# Patient Record
Sex: Male | Born: 1970 | Race: Black or African American | Hispanic: No | Marital: Married | Smoking: Former smoker
Health system: Southern US, Community
[De-identification: ages and names within clinical notes are randomized; demographics above are authoritative.]

## PROBLEM LIST (undated history)

## (undated) DIAGNOSIS — J939 Pneumothorax, unspecified: Secondary | ICD-10-CM

## (undated) DIAGNOSIS — I10 Essential (primary) hypertension: Secondary | ICD-10-CM

## (undated) DIAGNOSIS — J449 Chronic obstructive pulmonary disease, unspecified: Secondary | ICD-10-CM

## (undated) DIAGNOSIS — F3181 Bipolar II disorder: Secondary | ICD-10-CM

## (undated) DIAGNOSIS — F419 Anxiety disorder, unspecified: Secondary | ICD-10-CM

## (undated) DIAGNOSIS — F329 Major depressive disorder, single episode, unspecified: Secondary | ICD-10-CM

## (undated) DIAGNOSIS — F32A Depression, unspecified: Secondary | ICD-10-CM

## (undated) DIAGNOSIS — I639 Cerebral infarction, unspecified: Secondary | ICD-10-CM

## (undated) DIAGNOSIS — R569 Unspecified convulsions: Secondary | ICD-10-CM

## (undated) HISTORY — PX: ESOPHAGEAL ATRESIA REPAIR: SHX1525

## (undated) HISTORY — DX: Chronic obstructive pulmonary disease, unspecified: J44.9

## (undated) HISTORY — DX: Cerebral infarction, unspecified: I63.9

---

## 2001-07-23 ENCOUNTER — Emergency Department (HOSPITAL_COMMUNITY): Admission: EM | Admit: 2001-07-23 | Discharge: 2001-07-23 | Payer: Self-pay | Admitting: *Deleted

## 2001-08-23 ENCOUNTER — Encounter: Payer: Self-pay | Admitting: Emergency Medicine

## 2001-08-23 ENCOUNTER — Emergency Department (HOSPITAL_COMMUNITY): Admission: EM | Admit: 2001-08-23 | Discharge: 2001-08-23 | Payer: Self-pay | Admitting: Emergency Medicine

## 2001-09-01 ENCOUNTER — Ambulatory Visit (HOSPITAL_COMMUNITY): Admission: RE | Admit: 2001-09-01 | Discharge: 2001-09-01 | Payer: Self-pay | Admitting: Internal Medicine

## 2001-09-01 ENCOUNTER — Encounter: Payer: Self-pay | Admitting: Internal Medicine

## 2001-11-26 ENCOUNTER — Encounter: Admission: RE | Admit: 2001-11-26 | Discharge: 2001-12-24 | Payer: Self-pay | Admitting: Orthopaedic Surgery

## 2001-12-13 ENCOUNTER — Encounter: Payer: Self-pay | Admitting: Emergency Medicine

## 2001-12-13 ENCOUNTER — Emergency Department (HOSPITAL_COMMUNITY): Admission: EM | Admit: 2001-12-13 | Discharge: 2001-12-13 | Payer: Self-pay | Admitting: Emergency Medicine

## 2002-06-27 ENCOUNTER — Emergency Department (HOSPITAL_COMMUNITY): Admission: EM | Admit: 2002-06-27 | Discharge: 2002-06-27 | Payer: Self-pay | Admitting: Emergency Medicine

## 2004-09-17 ENCOUNTER — Emergency Department (HOSPITAL_COMMUNITY): Admission: EM | Admit: 2004-09-17 | Discharge: 2004-09-17 | Payer: Self-pay | Admitting: Emergency Medicine

## 2004-12-17 ENCOUNTER — Emergency Department (HOSPITAL_COMMUNITY): Admission: EM | Admit: 2004-12-17 | Discharge: 2004-12-17 | Payer: Self-pay | Admitting: Emergency Medicine

## 2005-06-19 ENCOUNTER — Ambulatory Visit: Payer: Self-pay | Admitting: Internal Medicine

## 2005-07-25 ENCOUNTER — Ambulatory Visit: Payer: Self-pay | Admitting: Internal Medicine

## 2005-08-02 ENCOUNTER — Ambulatory Visit: Payer: Self-pay | Admitting: Internal Medicine

## 2005-08-14 ENCOUNTER — Ambulatory Visit: Payer: Self-pay | Admitting: Internal Medicine

## 2005-08-16 ENCOUNTER — Ambulatory Visit: Payer: Self-pay | Admitting: Internal Medicine

## 2006-09-01 ENCOUNTER — Emergency Department (HOSPITAL_COMMUNITY): Admission: EM | Admit: 2006-09-01 | Discharge: 2006-09-01 | Payer: Self-pay | Admitting: Family Medicine

## 2008-07-15 ENCOUNTER — Ambulatory Visit: Payer: Self-pay | Admitting: Internal Medicine

## 2008-07-15 LAB — CONVERTED CEMR LAB
BUN: 16 mg/dL (ref 6–23)
CO2: 26 meq/L (ref 19–32)
Calcium: 9.1 mg/dL (ref 8.4–10.5)
Chloride: 108 meq/L (ref 96–112)
Cholesterol: 209 mg/dL — ABNORMAL HIGH (ref 0–200)
Creatinine, Ser: 1.15 mg/dL (ref 0.40–1.50)
Glucose, Bld: 105 mg/dL — ABNORMAL HIGH (ref 70–99)
HDL: 89 mg/dL (ref >39)
LDL Cholesterol: 99 mg/dL (ref 0–99)
Microalb, Ur: 20.73 mg/dL — ABNORMAL HIGH (ref 0.00–1.89)
Potassium: 3.9 meq/L (ref 3.5–5.3)
Sodium: 145 meq/L (ref 135–145)
Total CHOL/HDL Ratio: 2.3
Triglycerides: 106 mg/dL (ref <150)
VLDL: 21 mg/dL (ref 0–40)

## 2008-09-14 ENCOUNTER — Ambulatory Visit: Payer: Self-pay | Admitting: Internal Medicine

## 2008-10-08 ENCOUNTER — Ambulatory Visit: Payer: Self-pay | Admitting: Internal Medicine

## 2009-02-16 ENCOUNTER — Other Ambulatory Visit: Payer: Self-pay | Admitting: Emergency Medicine

## 2009-02-16 ENCOUNTER — Inpatient Hospital Stay (HOSPITAL_COMMUNITY): Admission: AD | Admit: 2009-02-16 | Discharge: 2009-02-19 | Payer: Self-pay | Admitting: Psychiatry

## 2009-02-16 ENCOUNTER — Ambulatory Visit: Payer: Self-pay | Admitting: Psychiatry

## 2009-12-06 ENCOUNTER — Inpatient Hospital Stay: Payer: Self-pay | Admitting: Psychiatry

## 2010-06-07 ENCOUNTER — Emergency Department (HOSPITAL_COMMUNITY)
Admission: EM | Admit: 2010-06-07 | Discharge: 2010-06-07 | Disposition: A | Discharge status: Home / Self Care | Payer: Medicare Other | Attending: Emergency Medicine | Admitting: Emergency Medicine

## 2010-06-07 DIAGNOSIS — I10 Essential (primary) hypertension: Secondary | ICD-10-CM | POA: Insufficient documentation

## 2010-06-07 DIAGNOSIS — F1010 Alcohol abuse, uncomplicated: Secondary | ICD-10-CM | POA: Insufficient documentation

## 2010-06-07 LAB — CBC
HCT: 45.2 % (ref 39.0–52.0)
Hemoglobin: 15 g/dL (ref 13.0–17.0)
MCHC: 33.2 g/dL (ref 30.0–36.0)
MCV: 91.4 fL (ref 78.0–100.0)
RBC: 4.95 MIL/uL (ref 4.22–5.81)
RDW: 14 % (ref 11.5–15.5)

## 2010-06-07 LAB — BASIC METABOLIC PANEL
CO2: 29 mEq/L (ref 19–32)
Calcium: 9.2 mg/dL (ref 8.4–10.5)
Chloride: 100 mEq/L (ref 96–112)
GFR calc Af Amer: >60 mL/min (ref >60)
Glucose, Bld: 116 mg/dL — ABNORMAL HIGH (ref 70–99)
Potassium: 4 mEq/L (ref 3.5–5.1)
Sodium: 137 mEq/L (ref 135–145)

## 2010-06-07 LAB — ETHANOL: Alcohol, Ethyl (B): <5 mg/dL (ref 0–10)

## 2010-06-07 LAB — HEPATIC FUNCTION PANEL
ALT: 33 U/L (ref 0–53)
AST: 22 U/L (ref 0–37)
AST: 27 U/L (ref 0–37)
Albumin: 3.5 g/dL (ref 3.5–5.2)
Alkaline Phosphatase: 79 U/L (ref 39–117)
Bilirubin, Direct: <0.1 mg/dL (ref 0.0–0.3)
Total Bilirubin: 0.3 mg/dL (ref 0.3–1.2)
Total Protein: 6 g/dL (ref 6.0–8.3)
Total Protein: 6.1 g/dL (ref 6.0–8.3)

## 2010-06-07 LAB — DIFFERENTIAL
Basophils Absolute: 0 10*3/uL (ref 0.0–0.1)
Basophils Relative: 1 % (ref 0–1)
Eosinophils Absolute: 0.2 10*3/uL (ref 0.0–0.7)
Lymphs Abs: 1.8 10*3/uL (ref 0.7–4.0)
Monocytes Absolute: 0.5 10*3/uL (ref 0.1–1.0)
Neutro Abs: 2.3 10*3/uL (ref 1.7–7.7)

## 2010-06-07 LAB — RAPID URINE DRUG SCREEN, HOSP PERFORMED
Amphetamines: NOT DETECTED
Opiates: NOT DETECTED
Tetrahydrocannabinol: NOT DETECTED

## 2010-06-08 ENCOUNTER — Emergency Department (HOSPITAL_COMMUNITY)
Admission: EM | Admit: 2010-06-08 | Discharge: 2010-06-09 | Disposition: A | Discharge status: Home / Self Care | Payer: Medicare Other | Attending: Emergency Medicine | Admitting: Emergency Medicine

## 2010-06-08 DIAGNOSIS — I10 Essential (primary) hypertension: Secondary | ICD-10-CM | POA: Insufficient documentation

## 2010-06-08 DIAGNOSIS — Q740 Other congenital malformations of upper limb(s), including shoulder girdle: Secondary | ICD-10-CM | POA: Insufficient documentation

## 2010-06-08 DIAGNOSIS — F1010 Alcohol abuse, uncomplicated: Secondary | ICD-10-CM | POA: Insufficient documentation

## 2010-06-08 LAB — DIFFERENTIAL
Eosinophils Absolute: 0.1 10*3/uL (ref 0.0–0.7)
Eosinophils Relative: 2 % (ref 0–5)
Lymphocytes Relative: 30 % (ref 12–46)
Lymphs Abs: 1.9 10*3/uL (ref 0.7–4.0)
Monocytes Relative: 11 % (ref 3–12)
Neutrophils Relative %: 58 % (ref 43–77)

## 2010-06-08 LAB — CBC
HCT: 41.5 % (ref 39.0–52.0)
MCH: 30.8 pg (ref 26.0–34.0)
MCV: 89.2 fL (ref 78.0–100.0)
Platelets: 174 10*3/uL (ref 150–400)
RBC: 4.65 MIL/uL (ref 4.22–5.81)

## 2010-06-08 LAB — RAPID URINE DRUG SCREEN, HOSP PERFORMED
Barbiturates: NOT DETECTED
Opiates: NOT DETECTED

## 2010-06-08 LAB — COMPREHENSIVE METABOLIC PANEL
Albumin: 3.6 g/dL (ref 3.5–5.2)
Alkaline Phosphatase: 86 U/L (ref 39–117)
BUN: 6 mg/dL (ref 6–23)
Chloride: 102 mEq/L (ref 96–112)
Creatinine, Ser: 1.11 mg/dL (ref 0.4–1.5)
GFR calc non Af Amer: >60 mL/min (ref >60)
Glucose, Bld: 227 mg/dL — ABNORMAL HIGH (ref 70–99)
Potassium: 3.5 mEq/L (ref 3.5–5.1)
Total Bilirubin: 0.4 mg/dL (ref 0.3–1.2)

## 2010-06-08 LAB — ETHANOL: Alcohol, Ethyl (B): <5 mg/dL (ref 0–10)

## 2011-11-17 ENCOUNTER — Emergency Department (HOSPITAL_COMMUNITY): Payer: Medicare Other

## 2011-11-17 ENCOUNTER — Encounter (HOSPITAL_COMMUNITY): Payer: Self-pay

## 2011-11-17 ENCOUNTER — Inpatient Hospital Stay (HOSPITAL_COMMUNITY)
Admission: EM | Admit: 2011-11-17 | Discharge: 2011-11-19 | DRG: 100 | Disposition: A | Discharge status: Home / Self Care | Payer: Medicare Other | Attending: Internal Medicine | Admitting: Internal Medicine

## 2011-11-17 DIAGNOSIS — J690 Pneumonitis due to inhalation of food and vomit: Secondary | ICD-10-CM

## 2011-11-17 DIAGNOSIS — F319 Bipolar disorder, unspecified: Secondary | ICD-10-CM | POA: Diagnosis present

## 2011-11-17 DIAGNOSIS — R569 Unspecified convulsions: Secondary | ICD-10-CM

## 2011-11-17 DIAGNOSIS — G919 Hydrocephalus, unspecified: Secondary | ICD-10-CM

## 2011-11-17 DIAGNOSIS — F1010 Alcohol abuse, uncomplicated: Secondary | ICD-10-CM | POA: Diagnosis present

## 2011-11-17 DIAGNOSIS — F3181 Bipolar II disorder: Secondary | ICD-10-CM

## 2011-11-17 DIAGNOSIS — G911 Obstructive hydrocephalus: Secondary | ICD-10-CM | POA: Diagnosis present

## 2011-11-17 DIAGNOSIS — Z720 Tobacco use: Secondary | ICD-10-CM

## 2011-11-17 DIAGNOSIS — Z8673 Personal history of transient ischemic attack (TIA), and cerebral infarction without residual deficits: Secondary | ICD-10-CM

## 2011-11-17 DIAGNOSIS — G40309 Generalized idiopathic epilepsy and epileptic syndromes, not intractable, without status epilepticus: Principal | ICD-10-CM | POA: Diagnosis present

## 2011-11-17 DIAGNOSIS — F1410 Cocaine abuse, uncomplicated: Secondary | ICD-10-CM | POA: Diagnosis present

## 2011-11-17 DIAGNOSIS — F17200 Nicotine dependence, unspecified, uncomplicated: Secondary | ICD-10-CM | POA: Diagnosis present

## 2011-11-17 DIAGNOSIS — I10 Essential (primary) hypertension: Secondary | ICD-10-CM | POA: Diagnosis present

## 2011-11-17 HISTORY — DX: Anxiety disorder, unspecified: F41.9

## 2011-11-17 HISTORY — DX: Depression, unspecified: F32.A

## 2011-11-17 HISTORY — DX: Bipolar II disorder: F31.81

## 2011-11-17 HISTORY — DX: Essential (primary) hypertension: I10

## 2011-11-17 HISTORY — DX: Major depressive disorder, single episode, unspecified: F32.9

## 2011-11-17 LAB — CBC WITH DIFFERENTIAL/PLATELET
Eosinophils Absolute: 0.1 10*3/uL (ref 0.0–0.7)
Eosinophils Relative: 1 % (ref 0–5)
HCT: 42.8 % (ref 39.0–52.0)
Hemoglobin: 15.5 g/dL (ref 13.0–17.0)
Lymphocytes Relative: 20 % (ref 12–46)
Lymphs Abs: 1.4 10*3/uL (ref 0.7–4.0)
MCH: 30.2 pg (ref 26.0–34.0)
MCV: 83.3 fL (ref 78.0–100.0)
Monocytes Relative: 8 % (ref 3–12)
Platelets: 144 10*3/uL — ABNORMAL LOW (ref 150–400)
RBC: 5.14 MIL/uL (ref 4.22–5.81)
WBC: 7 10*3/uL (ref 4.0–10.5)

## 2011-11-17 LAB — RAPID URINE DRUG SCREEN, HOSP PERFORMED
Amphetamines: NOT DETECTED
Benzodiazepines: NOT DETECTED
Opiates: NOT DETECTED

## 2011-11-17 LAB — URINALYSIS, ROUTINE W REFLEX MICROSCOPIC
Glucose, UA: NEGATIVE mg/dL
Hgb urine dipstick: NEGATIVE
pH: 5.5 (ref 5.0–8.0)

## 2011-11-17 LAB — COMPREHENSIVE METABOLIC PANEL
ALT: 15 U/L (ref 0–53)
Alkaline Phosphatase: 87 U/L (ref 39–117)
BUN: 21 mg/dL (ref 6–23)
CO2: 31 mEq/L (ref 19–32)
Calcium: 10.2 mg/dL (ref 8.4–10.5)
GFR calc Af Amer: 72 mL/min — ABNORMAL LOW (ref >90)
GFR calc non Af Amer: 62 mL/min — ABNORMAL LOW (ref >90)
Glucose, Bld: 137 mg/dL — ABNORMAL HIGH (ref 70–99)
Sodium: 137 mEq/L (ref 135–145)
Total Protein: 7.8 g/dL (ref 6.0–8.3)

## 2011-11-17 MED ORDER — SODIUM CHLORIDE 0.9 % IV SOLN
INTRAVENOUS | Status: DC
  Administered 2011-11-17: 20:00:00 via INTRAVENOUS

## 2011-11-17 MED ORDER — SODIUM CHLORIDE 0.9 % IV SOLN
3.0000 g | Freq: Once | INTRAVENOUS | Status: AC
  Administered 2011-11-17: 3 g via INTRAVENOUS
  Filled 2011-11-17: qty 3

## 2011-11-17 MED ORDER — SODIUM CHLORIDE 0.9 % IV BOLUS (SEPSIS)
1000.0000 mL | Freq: Once | INTRAVENOUS | Status: AC
  Administered 2011-11-17: 1000 mL via INTRAVENOUS

## 2011-11-17 NOTE — H&P (Signed)
PCP/psychiatrist's:    None  Chief Complaint:  Seizures  HPI: This is a 41 year old gentleman with known history of bipolar disease, he was at the bus stop with his significant other earlier today when his eyes rolled back in his head and he started jerking and convulsing almost falling over the back of a fence on which he was leaning. He regained consciousness without falling, this recurred approximately 3 times. The patient never had a grand mal seizure but he was postictal. 911 was called he was brought to the ER. The patient had several emesis with each of the episodes and again in the ER. He had bowel incontinence with episodes, no tongue laceration. In the ER he has a low-grade fever and reports chills. He Reports tinnitus and blurred vision with the seizures. He has never had a seizure before.   Review of Systems:  The patient denies anorexia, fever, weight loss,, vision loss, decreased hearing, hoarseness, chest pain, syncope, dyspnea on exertion, peripheral edema, balance deficits, hemoptysis, abdominal pain, melena, hematochezia, severe indigestion/heartburn, hematuria, incontinence, genital sores, muscle weakness, suspicious skin lesions, transient blindness, difficulty walking, depression, unusual weight change, abnormal bleeding, enlarged lymph nodes, angioedema, and breast masses.  Past Medical History: Past Medical History  Diagnosis Date  . Hypertension   . Bipolar 2 disorder    History reviewed. No pertinent past surgical history.  Medications: Prior to Admission medications   Medication Sig Start Date End Date Taking? Authorizing Provider  LISINOPRIL PO Take 1 tablet by mouth daily.   Yes Historical Provider, MD  PRESCRIPTION MEDICATION Take 1 tablet by mouth 2 (two) times daily. Blood pressure medication that starts with "B"   Yes Historical Provider, MD    Allergies:  No Known Allergies  Social History:  reports that he has been smoking Cigarettes.  He has been  smoking about .5 packs per day. He does not have any smokeless tobacco history on file. He reports that he drinks alcohol. He reports that he uses illicit drugs (Cocaine).  Family History: Family History  Problem Relation Age of Onset  . Diabetes type II      Physical Exam: Filed Vitals:   11/17/11 1724 11/17/11 2048  BP: 131/88 129/93  Pulse: 102 104  Temp: 100.2 F (37.9 C) 99.4 F (37.4 C)  TempSrc: Oral   Resp: 24 16  SpO2: 94% 94%    General:  Alert and oriented times three, well developed and nourished, no acute distress Eyes: PERRLA, pink conjunctiva, no scleral icterus ENT: Moist oral mucosa, neck supple, no thyromegaly Lungs: clear to ascultation, no wheeze, no crackles, no use of accessory muscles Cardiovascular: regular rate and rhythm, no regurgitation, no gallops, no murmurs. No carotid bruits, no JVD Abdomen: soft, positive BS, non-tender, non-distended, no organomegaly, not an acute abdomen GU: not examined Neuro: CN II - XII grossly intact, sensation intact Musculoskeletal: strength 5/5 all extremities, no clubbing, cyanosis or edema Skin: no rash, no subcutaneous crepitation, no decubitus Psych: appropriate patient   Labs on Admission:   [facility] 11/17/11 1812  NA 137  K 3.6  CL 94*  CO2 31  GLUCOSE 137*  BUN 21  CREATININE 1.38*  CALCIUM 10.2  MG --  PHOS --    Basename 11/17/11 1812  AST 21  ALT 15  ALKPHOS 87  BILITOT 0.7  PROT 7.8  ALBUMIN 4.0   No results found for this basename: LIPASE:2,AMYLASE:2 in the last 72 hours  Basename 11/17/11 1812  WBC 7.0  NEUTROABS 4.9  HGB 15.5  HCT 42.8  MCV 83.3  PLT 144*   No results found for this basename: CKTOTAL:3,CKMB:3,CKMBINDEX:3,TROPONINI:3 in the last 72 hours No components found with this basename: POCBNP:3 No results found for this basename: DDIMER:2 in the last 72 hours No results found for this basename: HGBA1C:2 in the last 72 hours No results found for this basename:  CHOL:2,HDL:2,LDLCALC:2,TRIG:2,CHOLHDL:2,LDLDIRECT:2 in the last 72 hours No results found for this basename: TSH,T4TOTAL,FREET3,T3FREE,THYROIDAB in the last 72 hours No results found for this basename: VITAMINB12:2,FOLATE:2,FERRITIN:2,TIBC:2,IRON:2,RETICCTPCT:2 in the last 72 hours  Micro Results: No results found for this or any previous visit (from the past 240 hour(s)). Results for NORVILLE, DANI (MRN [id number]) as of 11/17/2011 23:13  Ref. Range 11/17/2011 18:12 11/17/2011 20:59  Alcohol, Ethyl (B) Latest Range: 0-11 mg/dL <08   AMPHETAMINES Latest Range: NONE DETECTED   NONE DETECTED  Barbiturates Latest Range: NONE DETECTED   NONE DETECTED  Benzodiazepines Latest Range: NONE DETECTED   NONE DETECTED  Opiates Latest Range: NONE DETECTED   NONE DETECTED  COCAINE Latest Range: NONE DETECTED   POSITIVE (A)  Tetrahydrocannabinol Latest Range: NONE DETECTED   NONE DETECTED  Results for SRIMAN, TALLY (MRN [id number]) as of 11/17/2011 23:13  Ref. Range 11/17/2011 20:59  Color, Urine Latest Range: YELLOW  YELLOW  APPearance Latest Range: CLEAR  HAZY (A)  Specific Gravity, Urine Latest Range: 1.005-1.030  1.021  pH Latest Range: 5.0-8.0  5.5  Glucose Latest Range: NEGATIVE mg/dL NEGATIVE  Bilirubin Urine Latest Range: NEGATIVE  SMALL (A)  Ketones, ur Latest Range: NEGATIVE mg/dL 15 (A)  Protein Latest Range: NEGATIVE mg/dL NEGATIVE  Urobilinogen, UA Latest Range: 0.0-1.0 mg/dL 1.0  Nitrite Latest Range: NEGATIVE  NEGATIVE  Leukocytes, UA Latest Range: NEGATIVE  NEGATIVE  Hgb urine dipstick Latest Range: NEGATIVE  NEGATIVE    Radiological Exams on Admission: Dg Chest 2 View  11/17/2011  *RADIOLOGY REPORT*  Clinical Data: Midsternal chest pain and weakness  CHEST - 2 VIEW  Comparison: None.  Findings: Mild central bronchial wall thickening noted.  Right basilar scarring. Focal left lower lobe airspace opacity is present.  Mild prominence of the right paratracheal stripe is present.  Heart size  is normal.  No pleural effusion.  No acute osseous finding.  IMPRESSION: Mild central bronchial wall thickening which may indicate chronic or acute bronchitis or reactive airways disease.  Prominence of the right paratracheal stripe.  This could represent prominent vascular pedicle, soft tissue mass, less likely thymus, or thyroid tissue.  Focal left lower lobe airspace opacity.  Differential considerations include pneumonia or possibly aspiration. Follow-up to radiographic resolution in 4-6 weeks is recommended, at which time the possible paratracheal abnormality could also be reevaluated.   Original Report Authenticated By: Harrel Lemon, M.D.    Ct Head Wo Contrast  11/17/2011  *RADIOLOGY REPORT*  Clinical Data: Seizures.  History of polysubstance abuse.  CT HEAD WITHOUT CONTRAST  Technique:  Contiguous axial images were obtained from the base of the skull through the vertex without contrast.  Comparison: None.  Findings: There is moderate diffuse dilatation of the lateral ventricles.  The third and fourth ventricles appear mildly dilated. The septum pellucidum and corpus callosum appear incompletely formed.  There is only mild generalized atrophy and no significant periventricular low density to suggest transependymal resorption of CSF.  There is no evidence of acute intracranial hemorrhage, mass lesion or extra-axial fluid collection.  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 Visualized paranasal sinuses are clear.  The calvarium is intact.  IMPRESSION:  1.  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  This can be associated with seizures.  Further evaluation with non emergent MRI suggested. 2.  No acute intracranial findings seen. 3.  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   Original Report  Authenticated By: Gerrianne Scale, M.D.     Assessment/Plan Present on Admission:  .Seizure Hydrocephalus -new finding  Seizures of new diagnosis Admit to step down with seizure precautions Keppra started MRI of the head for further evaluation Cause of patient's seizures can be multifactorial including patient's hydrocephalus, alcohol use, cocaine use Defer to a.m. team for your consult  Aspiration pneumonia Antibiotics started  Alcohol abuse CIWA protocol  Cocaine abuse Tobacco abuse Bipolar disorder Hypertension  stable, nicotine patch ordered   Full code DVT prophylaxis   Caio Devera 11/17/2011, 11:09 PM

## 2011-11-17 NOTE — ED Notes (Addendum)
Pt wife states, they were waiting on bus and pt "began bucking backwards and his eyes rolled back." Pt has no memory of event, states "My brain went numb and I couldn't see." Pt currently alert and oriented. States he drank 2 cups of beer today, used cocaine yesterday, but has not used any drugs today.

## 2011-11-17 NOTE — ED Notes (Signed)
Per ems- Pt was walking down street, pt wife states she helped him to ground and pt eyes rolled back in head, arms "jerked" and loss control of bowels. Pt has no hx of seizure.  Pt was neuro intact en route with EMS, however pt asked ems "what happened" when pt arrived at hospital. CBG-240 20g IV rfa, BP-145/93 HR-91 NSR RR-36 O2-97% on 2L.

## 2011-11-17 NOTE — ED Provider Notes (Signed)
History     CSN: [id number]  Arrival date & time 11/17/11  1717   First MD Initiated Contact with Patient 11/17/11 1744      Chief Complaint  Patient presents with  . Seizures    (Consider location/radiation/quality/duration/timing/severity/associated sxs/prior treatment) Patient is a 41 y.o. male presenting with seizures. The history is provided by the patient.  Seizures    patient here complaining of a seizure x3 each time lasting less than a minute. Patient was post ictal afterwards and did have loss of bowel function. No tongue biting. No prior history of seizure noted. Does admit to polysubstance abuse with cocaine and alcohol the patient last drank today. Does note that he has been decreasing his daily alcohol use which is between 40-80 ounces of beer. Denies any headaches or fever or neck pain or photophobia. No cough or dyspnea. Denies any abdominal pain. Was transported by EMS  Past Medical History  Diagnosis Date  . Hypertension     History reviewed. No pertinent past surgical history.  History reviewed. No pertinent family history.  History  Substance Use Topics  . Smoking status: Current Every Day Smoker -- 0.5 packs/day    Types: Cigarettes  . Smokeless tobacco: Not on file  . Alcohol Use: Yes     1 40oz/day      Review of Systems  Neurological: Positive for seizures.  All other systems reviewed and are negative.    Allergies  Review of patient's allergies indicates no known allergies.  Home Medications  No current outpatient prescriptions on file.  BP 131/88  Pulse 102  Temp 100.2 F (37.9 C) (Oral)  Resp 24  SpO2 94%  Physical Exam  Nursing note and vitals reviewed. Constitutional: He is oriented to person, place, and time. He appears well-developed and well-nourished.  Non-toxic appearance. No distress.  HENT:  Head: Normocephalic and atraumatic.  Eyes: Conjunctivae normal, EOM and lids are normal. Pupils are equal, round, and reactive  to light.  Neck: Normal range of motion. Neck supple. No tracheal deviation present. No mass present.  Cardiovascular: Normal rate, regular rhythm and normal heart sounds.  Exam reveals no gallop.   No murmur heard. Pulmonary/Chest: Effort normal and breath sounds normal. No stridor. No respiratory distress. He has no decreased breath sounds. He has no wheezes. He has no rhonchi. He has no rales.  Abdominal: Soft. Normal appearance and bowel sounds are normal. He exhibits no distension. There is no tenderness. There is no rebound and no CVA tenderness.  Musculoskeletal: Normal range of motion. He exhibits no edema and no tenderness.       Left upper extremity congenital malformed limb noted  Neurological: He is alert and oriented to person, place, and time. He has normal strength. No cranial nerve deficit or sensory deficit. GCS eye subscore is 4. GCS verbal subscore is 5. GCS motor subscore is 6.  Skin: Skin is warm and dry. No abrasion and no rash noted.  Psychiatric: He has a normal mood and affect. His speech is normal and behavior is normal.    ED Course  Procedures (including critical care time)  Labs Reviewed - No data to display No results found.   No diagnosis found.    MDM  Pt started on Unasyn for suspected aspiration pneumonia. Patient will be admitted by the hospitalist        Toy Baker, MD 11/17/11 2242

## 2011-11-18 ENCOUNTER — Encounter (HOSPITAL_COMMUNITY): Payer: Self-pay

## 2011-11-18 ENCOUNTER — Inpatient Hospital Stay (HOSPITAL_COMMUNITY): Payer: Medicare Other

## 2011-11-18 DIAGNOSIS — F1010 Alcohol abuse, uncomplicated: Secondary | ICD-10-CM

## 2011-11-18 DIAGNOSIS — F3189 Other bipolar disorder: Secondary | ICD-10-CM

## 2011-11-18 LAB — BASIC METABOLIC PANEL
CO2: 28 mEq/L (ref 19–32)
Calcium: 9.1 mg/dL (ref 8.4–10.5)
Chloride: 99 mEq/L (ref 96–112)
Creatinine, Ser: 1.1 mg/dL (ref 0.50–1.35)
Glucose, Bld: 208 mg/dL — ABNORMAL HIGH (ref 70–99)

## 2011-11-18 LAB — CBC
HCT: 37.8 % — ABNORMAL LOW (ref 39.0–52.0)
MCH: 29.8 pg (ref 26.0–34.0)
MCV: 82.9 fL (ref 78.0–100.0)
Platelets: 152 10*3/uL (ref 150–400)
RBC: 4.56 MIL/uL (ref 4.22–5.81)
RDW: 12.5 % (ref 11.5–15.5)

## 2011-11-18 LAB — MRSA PCR SCREENING: MRSA by PCR: NEGATIVE

## 2011-11-18 MED ORDER — LEVETIRACETAM 500 MG PO TABS
500.0000 mg | ORAL_TABLET | Freq: Two times a day (BID) | ORAL | Status: DC
  Administered 2011-11-18 – 2011-11-19 (×3): 500 mg via ORAL
  Filled 2011-11-18 (×4): qty 1

## 2011-11-18 MED ORDER — LORAZEPAM 1 MG PO TABS: 0.0000 mg | ORAL_TABLET | Freq: Two times a day (BID) | ORAL | Status: DC

## 2011-11-18 MED ORDER — LORAZEPAM 1 MG PO TABS: 0.0000 mg | ORAL_TABLET | Freq: Four times a day (QID) | ORAL | Status: DC

## 2011-11-18 MED ORDER — ONDANSETRON HCL 4 MG PO TABS: 4.0000 mg | ORAL_TABLET | Freq: Four times a day (QID) | ORAL | Status: DC | PRN

## 2011-11-18 MED ORDER — BENZONATATE 100 MG PO CAPS
100.0000 mg | ORAL_CAPSULE | Freq: Three times a day (TID) | ORAL | Status: DC | PRN
  Filled 2011-11-18: qty 1

## 2011-11-18 MED ORDER — THIAMINE HCL 100 MG/ML IJ SOLN
100.0000 mg | Freq: Every day | INTRAMUSCULAR | Status: DC
  Filled 2011-11-18 (×2): qty 1

## 2011-11-18 MED ORDER — CLONIDINE HCL 0.2 MG PO TABS
0.2000 mg | ORAL_TABLET | Freq: Two times a day (BID) | ORAL | Status: DC
  Filled 2011-11-18: qty 1

## 2011-11-18 MED ORDER — NICOTINE 14 MG/24HR TD PT24
14.0000 mg | MEDICATED_PATCH | Freq: Every day | TRANSDERMAL | Status: DC
  Administered 2011-11-18 – 2011-11-19 (×2): 14 mg via TRANSDERMAL
  Filled 2011-11-18 (×2): qty 1

## 2011-11-18 MED ORDER — FOLIC ACID 1 MG PO TABS
1.0000 mg | ORAL_TABLET | Freq: Every day | ORAL | Status: DC
  Administered 2011-11-18 – 2011-11-19 (×2): 1 mg via ORAL
  Filled 2011-11-18 (×2): qty 1

## 2011-11-18 MED ORDER — ENOXAPARIN SODIUM 40 MG/0.4ML ~~LOC~~ SOLN
40.0000 mg | SUBCUTANEOUS | Status: DC
  Administered 2011-11-18: 40 mg via SUBCUTANEOUS
  Filled 2011-11-18 (×2): qty 0.4

## 2011-11-18 MED ORDER — LORAZEPAM 2 MG/ML IJ SOLN: 1.0000 mg | Freq: Four times a day (QID) | INTRAMUSCULAR | Status: DC | PRN

## 2011-11-18 MED ORDER — LISINOPRIL 20 MG PO TABS
20.0000 mg | ORAL_TABLET | Freq: Every day | ORAL | Status: DC
  Administered 2011-11-18 – 2011-11-19 (×2): 20 mg via ORAL
  Filled 2011-11-18 (×2): qty 1

## 2011-11-18 MED ORDER — IPRATROPIUM BROMIDE 0.02 % IN SOLN: 0.5000 mg | RESPIRATORY_TRACT | Status: DC | PRN

## 2011-11-18 MED ORDER — ALBUTEROL SULFATE (5 MG/ML) 0.5% IN NEBU: 2.5000 mg | INHALATION_SOLUTION | RESPIRATORY_TRACT | Status: DC | PRN

## 2011-11-18 MED ORDER — ALUM & MAG HYDROXIDE-SIMETH 200-200-20 MG/5ML PO SUSP: 30.0000 mL | Freq: Four times a day (QID) | ORAL | Status: DC | PRN

## 2011-11-18 MED ORDER — LISINOPRIL 20 MG PO TABS
20.0000 mg | ORAL_TABLET | Freq: Every day | ORAL | Status: DC
  Filled 2011-11-18: qty 1

## 2011-11-18 MED ORDER — HYDROCHLOROTHIAZIDE 25 MG PO TABS
25.0000 mg | ORAL_TABLET | Freq: Every day | ORAL | Status: DC
  Filled 2011-11-18: qty 1

## 2011-11-18 MED ORDER — ADULT MULTIVITAMIN W/MINERALS CH
1.0000 | ORAL_TABLET | Freq: Every day | ORAL | Status: DC
  Administered 2011-11-18 – 2011-11-19 (×2): 1 via ORAL
  Filled 2011-11-18 (×2): qty 1

## 2011-11-18 MED ORDER — SODIUM CHLORIDE 0.9 % IV SOLN
500.0000 mg | Freq: Once | INTRAVENOUS | Status: AC
  Administered 2011-11-18: 500 mg via INTRAVENOUS
  Filled 2011-11-18: qty 5

## 2011-11-18 MED ORDER — HYDROCHLOROTHIAZIDE 25 MG PO TABS
25.0000 mg | ORAL_TABLET | Freq: Every day | ORAL | Status: DC
  Administered 2011-11-18 – 2011-11-19 (×2): 25 mg via ORAL
  Filled 2011-11-18 (×2): qty 1

## 2011-11-18 MED ORDER — SODIUM CHLORIDE 0.9 % IV SOLN: INTRAVENOUS | Status: DC

## 2011-11-18 MED ORDER — FAMOTIDINE IN NACL 20-0.9 MG/50ML-% IV SOLN
20.0000 mg | Freq: Two times a day (BID) | INTRAVENOUS | Status: AC
  Administered 2011-11-18 (×2): 20 mg via INTRAVENOUS
  Filled 2011-11-18: qty 50

## 2011-11-18 MED ORDER — SENNOSIDES-DOCUSATE SODIUM 8.6-50 MG PO TABS: 1.0000 | ORAL_TABLET | Freq: Every evening | ORAL | Status: DC | PRN

## 2011-11-18 MED ORDER — DIPHENHYDRAMINE HCL 50 MG/ML IJ SOLN
25.0000 mg | Freq: Four times a day (QID) | INTRAMUSCULAR | Status: DC | PRN
  Administered 2011-11-18: 25 mg via INTRAVENOUS
  Filled 2011-11-18: qty 1

## 2011-11-18 MED ORDER — ONDANSETRON HCL 4 MG/2ML IJ SOLN: 4.0000 mg | Freq: Four times a day (QID) | INTRAMUSCULAR | Status: DC | PRN

## 2011-11-18 MED ORDER — DEXTROSE 5 % IV SOLN
1.0000 g | INTRAVENOUS | Status: DC
  Administered 2011-11-18 – 2011-11-19 (×2): 1 g via INTRAVENOUS
  Filled 2011-11-18 (×3): qty 10

## 2011-11-18 MED ORDER — VITAMIN B-1 100 MG PO TABS
100.0000 mg | ORAL_TABLET | Freq: Every day | ORAL | Status: DC
  Administered 2011-11-18 – 2011-11-19 (×2): 100 mg via ORAL
  Filled 2011-11-18 (×2): qty 1

## 2011-11-18 MED ORDER — POTASSIUM CHLORIDE IN NACL 20-0.9 MEQ/L-% IV SOLN
INTRAVENOUS | Status: DC
  Administered 2011-11-18 – 2011-11-19 (×3): via INTRAVENOUS
  Filled 2011-11-18 (×5): qty 1000

## 2011-11-18 MED ORDER — CLONIDINE HCL 0.2 MG PO TABS
0.2000 mg | ORAL_TABLET | Freq: Two times a day (BID) | ORAL | Status: DC
  Administered 2011-11-18 – 2011-11-19 (×2): 0.2 mg via ORAL
  Filled 2011-11-18 (×3): qty 1

## 2011-11-18 MED ORDER — LORAZEPAM 1 MG PO TABS: 1.0000 mg | ORAL_TABLET | Freq: Four times a day (QID) | ORAL | Status: DC | PRN

## 2011-11-18 MED ORDER — AZITHROMYCIN 500 MG IV SOLR
500.0000 mg | INTRAVENOUS | Status: DC
  Administered 2011-11-18 – 2011-11-19 (×2): 500 mg via INTRAVENOUS
  Filled 2011-11-18 (×4): qty 500

## 2011-11-18 MED ORDER — GADOBENATE DIMEGLUMINE 529 MG/ML IV SOLN
10.0000 mL | Freq: Once | INTRAVENOUS | Status: AC | PRN
  Administered 2011-11-18: 10 mL via INTRAVENOUS
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
## 2011-11-18 NOTE — Progress Notes (Signed)
Dr. Butler Denmark paged, pt received from mri, pt noticed on right forearm welts, itching,no c/o of throat closing, awaiting orders from dr. Butler Denmark.

## 2011-11-18 NOTE — Progress Notes (Signed)
[facility] PROGRESS NOTE  Brian Lucero ZOX:[id number] DOB: Mar 13, 1970 DOA: 11/17/2011 PCP: Sheila Oats, MD  Assessment/Plan: Active Problems:  Seizure In the setting of recent Cocaine use- appreciate Neuro f/u-   MRI reveals hemorrhagic infarct which may be subacute- EEG will be performed on Monday. Cont Keppra.   Hemorrhagic Infarct- age indeterminate Hold Lovenox- cont to follow neurochecks   Tobacco abuse Counseled to discontinue.    Alcohol abuse Counseled to discontinue- currently on CIWA- Pt has been drinking beer for about 4-5 days.    Cocaine abuse Explained the connection between Cocaine and CVA - he states the will not use Cocaine again.    Bipolar 2 disorder Has not recently been on treatment for this- is considering a psych eval while in the hospital.    Hypertension Resume home meds but with holding parameters.    Hydrocephalus F/u work up with MRI   Aspiration pneumonia? Has been started on Rocephin and Zithromax- currently dose not have pneumonia symptoms- will f/u clinically.     Code Status: Full Code Family Communication: none Disposition Plan: follow in SDU DVT prophylaxis: Lovenox- will need to d/c this as MRI reveals possible subacute infarct-    Brief narrative: This is a 41 year old gentleman with known history of bipolar disease, he was at the bus stop with his significant other earlier today when his eyes rolled back in his head and he started jerking and convulsing almost falling over the back of a fence on which he was leaning. He regained consciousness without falling, this recurred approximately 3 times. The patient never had a grand mal seizure but he was postictal. 911 was called he was brought to the ER. The patient had several emesis with each of the episodes and again in the ER. He had bowel incontinence with episodes, no tongue laceration. In the ER he has a low-grade fever and reports chills. He Reports tinnitus and blurred  vision with the seizures. He has never had a seizure before.   Consultants:  Neurohospitalist  Procedures:  none  Antibiotics:  Rocephin  Zithromax  HPI/Subjective Pt without any physical complaints. He admits that he used Cocaine about 3-4 days ago. He has been drinking about 2-4 "glasses" of beer since getting out of jail as well.   Objective: Filed Vitals:   11/18/11 0831 11/18/11 1103 11/18/11 1148 11/18/11 1457  BP: 150/88  149/99 161/95  Pulse: 100  106 101  Temp:  99.2 F (37.3 C)  98.4 F (36.9 C)  TempSrc:  Oral  Oral  Resp: 22  24 29   Height:      Weight:      SpO2: 97%  97% 97%    Intake/Output Summary (Last 24 hours) at 11/18/11 1701 Last data filed at 11/18/11 1500  Gross per 24 hour  Intake   2520 ml  Output    525 ml  Net   1995 ml    Exam:   General:  Alert, no distress  Cardiovascular: RRR, no murmur  Respiratory: CTA b/l  Abdomen: soft, NT, ND, BS+  Ext: no c/c/e  Data Reviewed: Basic Metabolic Panel:  Lab 11/18/11 8119 11/18/11 0157 11/17/11 1812  NA -- 138 137  K -- 3.4* 3.6  CL -- 99 94*  CO2 -- 28 31  GLUCOSE -- 208* 137*  BUN -- 16 21  CREATININE 1.13 1.10 1.38*  CALCIUM -- 9.1 10.2  MG -- -- --  PHOS -- -- --   Liver Function Tests:  Lab 11/17/11  1812  AST 21  ALT 15  ALKPHOS 87  BILITOT 0.7  PROT 7.8  ALBUMIN 4.0   No results found for this basename: LIPASE:5,AMYLASE:5 in the last 168 hours No results found for this basename: AMMONIA:5 in the last 168 hours CBC:  Lab 11/18/11 0157 11/17/11 1812  WBC 9.8 7.0  NEUTROABS -- 4.9  HGB 13.6 15.5  HCT 37.8* 42.8  MCV 82.9 83.3  PLT 152 144*   Cardiac Enzymes: No results found for this basename: CKTOTAL:5,CKMB:5,CKMBINDEX:5,TROPONINI:5 in the last 168 hours BNP (last 3 results) No results found for this basename: PROBNP:3 in the last 8760 hours CBG: No results found for this basename: GLUCAP:5 in the last 168 hours  Recent Results (from the past 240  hour(s))  MRSA PCR SCREENING     Status: Normal   Collection Time   11/18/11  1:22 AM      Component Value Range Status Comment   MRSA by PCR NEGATIVE  NEGATIVE Final      Studies: Dg Chest 2 View  11/17/2011  *RADIOLOGY REPORT*  Clinical Data: Midsternal chest pain and weakness  CHEST - 2 VIEW  Comparison: None.  Findings: Mild central bronchial wall thickening noted.  Right basilar scarring. Focal left lower lobe airspace opacity is present.  Mild prominence of the right paratracheal stripe is present.  Heart size is normal.  No pleural effusion.  No acute osseous finding.  IMPRESSION: Mild central bronchial wall thickening which may indicate chronic or acute bronchitis or reactive airways disease.  Prominence of the right paratracheal stripe.  This could represent prominent vascular pedicle, soft tissue mass, less likely thymus, or thyroid tissue.  Focal left lower lobe airspace opacity.  Differential considerations include pneumonia or possibly aspiration. Follow-up to radiographic resolution in 4-6 weeks is recommended, at which time the possible paratracheal abnormality could also be reevaluated.   Original Report Authenticated By: Harrel Lemon, M.D.    Ct Head Wo Contrast  11/17/2011  *RADIOLOGY REPORT*  Clinical Data: Seizures.  History of polysubstance abuse.  CT HEAD WITHOUT CONTRAST  Technique:  Contiguous axial images were obtained from the base of the skull through the vertex without contrast.  Comparison: None.  Findings: There is moderate diffuse dilatation of the lateral ventricles.  The third and fourth ventricles appear mildly dilated. The septum pellucidum and corpus callosum appear incompletely formed.  There is only mild generalized atrophy and no significant periventricular low density to suggest transependymal resorption of CSF.  There is no evidence of acute intracranial hemorrhage, mass lesion or extra-axial fluid collection.  There is apparent congenital deformity at the  craniocervical junction and in the upper cervical spine, incompletely visualized. Visualized paranasal sinuses are clear.  The calvarium is intact.  IMPRESSION:  1.  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  This can be associated with seizures.  Further evaluation with non emergent MRI suggested. 2.  No acute intracranial findings seen. 3.  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   Original Report Authenticated By: Gerrianne Scale, M.D.    Mr [facility] Wo Contrast  11/18/2011  *RADIOLOGY REPORT*  Clinical Data: Right-sided numbness.  Abnormal head CT.  MRI HEAD WITHOUT AND WITH CONTRAST,MRA HEAD WITHOUT CONTRAST  Technique:  Multiplanar, multiecho pulse sequences of the brain and surrounding structures were obtained according to standard protocol without and with intravenous contrast,Technique: Angiographic images of the Circle of Willis were obtained using MRA te  Contrast: 10mL MULTIHANCE GADOBENATE DIMEGLUMINE 529 MG/ML IV SOLN  Comparison: Head CT 11/17/2011  Findings: In the left external capsule, there is a 1 cm hemorrhage or hemorrhagic infarction.  This is difficult to date with certainty.  Elsewhere in the cerebral hemispheres, there are scattered foci of FLAIR and T Q signal consistent with mild chronic small vessel disease.  No other areas show evidence of previous hemorrhage.  No large vessel territory infarction.  There is hydrocephalus of the lateral and third ventricles.  The cerebral aqueduct and fourth ventricle appear normal.  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  No evidence of mass lesion.  No extra-axial collection.  No pituitary mass.  No inflammatory sinus disease.  There are extensive degenerative changes in the upper cervical spine.  There is assimilation of C1 and the skull base.   IMPRESSION: Hemorrhage or hemorrhagic infarction in the external capsule on the left.  Exact age is difficult to determine but this could be subacute or old.  Mild chronic small vessel changes elsewhere within the cerebral hemispheric white matter.  Dilated lateral and third ventricles.  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  Findings are in the spectrum of lobe lower lobe presence of Foley.  Cerebral aqueduct and fourth ventricle are not dilated, raising the possibility of compensated hydrocephalus.  MRA HEAD WITHOUT CONTRAST  Technique:  Angiographic images of the Circle of Willis were obtained using MRA technique without intravenous contrast.  Findings:  Both internal carotid arteries are widely patent into the brain.  The anterior and middle cerebral vessels are patent without proximal stenosis, aneurysm or vascular malformation.  Both vertebral arteries are patent to the basilar.  No basilar stenosis. Posterior circulation branch vessels are normal.  IMPRESSION: No abnormality of the large or medium-sized vessels.   Original Report Authenticated By: Thomasenia Sales, M.D.    Mr [facility] ZO Contrast  11/18/2011  *RADIOLOGY REPORT*  Clinical Data: Right-sided numbness.  Abnormal head CT.  MRI HEAD WITHOUT AND WITH CONTRAST,MRA HEAD WITHOUT CONTRAST  Technique:  Multiplanar, multiecho pulse sequences of the brain and surrounding structures were obtained according to standard protocol without and with intravenous contrast,Technique: Angiographic images of the Circle of Willis were obtained using MRA te  Contrast: 10mL MULTIHANCE GADOBENATE DIMEGLUMINE 529 MG/ML IV SOLN  Comparison: Head CT 11/17/2011  Findings: In the left external capsule, there is a 1 cm hemorrhage or hemorrhagic infarction.  This is difficult to date with certainty.  Elsewhere in the cerebral hemispheres, there are scattered foci of FLAIR and T Q signal consistent with mild chronic small vessel disease.  No other areas  show evidence of previous hemorrhage.  No large vessel territory infarction.  There is hydrocephalus of the lateral and third ventricles.  The cerebral aqueduct and fourth ventricle appear normal.  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  No evidence of mass lesion.  No extra-axial collection.  No pituitary mass.  No inflammatory sinus disease.  There are extensive degenerative changes in the upper cervical spine.  There is assimilation of C1 and the skull base.  IMPRESSION: Hemorrhage or hemorrhagic infarction in the external capsule on the left.  Exact age is difficult to determine but this could be subacute or old.  Mild chronic small vessel changes elsewhere within the cerebral hemispheric white matter.  Dilated lateral and third ventricles.  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  Findings are in the spectrum of lobe lower lobe presence of Foley.  Cerebral aqueduct and fourth ventricle are not dilated, raising the possibility of compensated hydrocephalus.  MRA HEAD WITHOUT CONTRAST  Technique:  Angiographic images of the Circle of Willis were obtained using MRA technique without intravenous contrast.  Findings:  Both internal carotid arteries are widely patent into the brain.  The anterior and middle cerebral vessels are patent without proximal stenosis, aneurysm or vascular malformation.  Both vertebral arteries are patent to the basilar.  No basilar stenosis. Posterior circulation branch vessels are normal.  IMPRESSION: No abnormality of the large or medium-sized vessels.   Original Report Authenticated By: Thomasenia Sales, M.D.     Scheduled Meds:   . ampicillin-sulbactam (UNASYN) IV  3 g Intravenous Once  . azithromycin  500 mg Intravenous Q24H  . cefTRIAXone (ROCEPHIN)  IV  1 g Intravenous Q24H  . enoxaparin (LOVENOX) injection  40 mg Subcutaneous Q24H  . famotidine (PEPCID) IV  20 mg Intravenous Q12H  .  folic acid  1 mg Oral Daily  . levetiracetam  500 mg Intravenous Once   Followed by  . levETIRAcetam  500 mg Oral BID  . LORazepam  0-4 mg Oral Q6H   Followed by  . LORazepam  0-4 mg Oral Q12H  . multivitamin with minerals  1 tablet Oral Daily  . nicotine  14 mg Transdermal Daily  . sodium chloride  1,000 mL Intravenous Once  . thiamine  100 mg Oral Daily   Or  . thiamine  100 mg Intravenous Daily   Continuous Infusions:   . sodium chloride 10 mL/hr at 11/18/11 1500  . 0.9 % NaCl with KCl 20 mEq / L 75 mL/hr at 11/18/11 1500  . DISCONTD: sodium chloride 125 mL/hr at 11/17/11 2000    ________________________________________________________________________  Time spent: 35 min    [facility]  [facility] Pager [phone] If 8PM-8AM, please contact night-coverage at [email or web address], password [facility] 11/18/2011, 5:01 PM  LOS: 1 day

## 2011-11-19 LAB — URINE CULTURE

## 2011-11-19 MED ORDER — LEVETIRACETAM 500 MG PO TABS: 500.0000 mg | ORAL_TABLET | Freq: Two times a day (BID) | ORAL | Status: DC
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
## 2011-11-20 NOTE — Progress Notes (Signed)
Utilization Review Completed.Brian Lucero T9/16/2013   
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
## 2012-07-30 ENCOUNTER — Encounter: Payer: Self-pay | Admitting: Diagnostic Neuroimaging

## 2012-07-31 NOTE — Progress Notes (Signed)
This encounter was created in error - please disregard.

## 2013-07-16 ENCOUNTER — Encounter (HOSPITAL_COMMUNITY): Payer: Self-pay | Admitting: Emergency Medicine

## 2013-07-16 ENCOUNTER — Emergency Department (HOSPITAL_COMMUNITY)
Admission: EM | Admit: 2013-07-16 | Discharge: 2013-07-16 | Disposition: A | Discharge status: Home / Self Care | Payer: Medicare Other | Attending: Emergency Medicine | Admitting: Emergency Medicine

## 2013-07-16 ENCOUNTER — Emergency Department (INDEPENDENT_AMBULATORY_CARE_PROVIDER_SITE_OTHER)
Admission: EM | Admit: 2013-07-16 | Discharge: 2013-07-16 | Disposition: A | Discharge status: Nursing Home (Includes ICF) | Payer: Medicare Other | Source: Home / Self Care | Attending: Emergency Medicine | Admitting: Emergency Medicine

## 2013-07-16 DIAGNOSIS — I10 Essential (primary) hypertension: Secondary | ICD-10-CM

## 2013-07-16 DIAGNOSIS — F3189 Other bipolar disorder: Secondary | ICD-10-CM | POA: Insufficient documentation

## 2013-07-16 DIAGNOSIS — Z8709 Personal history of other diseases of the respiratory system: Secondary | ICD-10-CM | POA: Insufficient documentation

## 2013-07-16 DIAGNOSIS — F17200 Nicotine dependence, unspecified, uncomplicated: Secondary | ICD-10-CM | POA: Insufficient documentation

## 2013-07-16 DIAGNOSIS — G40909 Epilepsy, unspecified, not intractable, without status epilepticus: Secondary | ICD-10-CM | POA: Insufficient documentation

## 2013-07-16 DIAGNOSIS — F411 Generalized anxiety disorder: Secondary | ICD-10-CM | POA: Insufficient documentation

## 2013-07-16 DIAGNOSIS — Z79899 Other long term (current) drug therapy: Secondary | ICD-10-CM | POA: Insufficient documentation

## 2013-07-16 HISTORY — DX: Unspecified convulsions: R56.9

## 2013-07-16 HISTORY — DX: Pneumothorax, unspecified: J93.9

## 2013-07-16 LAB — CBC WITH DIFFERENTIAL/PLATELET
Basophils Absolute: 0 10*3/uL (ref 0.0–0.1)
Basophils Relative: 0 % (ref 0–1)
EOS ABS: 0.1 10*3/uL (ref 0.0–0.7)
EOS PCT: 2 % (ref 0–5)
HCT: 46.3 % (ref 39.0–52.0)
HEMOGLOBIN: 16.1 g/dL (ref 13.0–17.0)
LYMPHS ABS: 1.4 10*3/uL (ref 0.7–4.0)
Lymphocytes Relative: 25 % (ref 12–46)
MCH: 31.2 pg (ref 26.0–34.0)
MCHC: 34.8 g/dL (ref 30.0–36.0)
MCV: 89.7 fL (ref 78.0–100.0)
MONO ABS: 0.3 10*3/uL (ref 0.1–1.0)
MONOS PCT: 6 % (ref 3–12)
Neutro Abs: 3.7 10*3/uL (ref 1.7–7.7)
Neutrophils Relative %: 67 % (ref 43–77)
Platelets: 162 10*3/uL (ref 150–400)
RBC: 5.16 MIL/uL (ref 4.22–5.81)
RDW: 14.9 % (ref 11.5–15.5)
WBC: 5.6 10*3/uL (ref 4.0–10.5)

## 2013-07-16 LAB — COMPREHENSIVE METABOLIC PANEL
ALK PHOS: 104 U/L (ref 39–117)
ALT: 18 U/L (ref 0–53)
AST: 22 U/L (ref 0–37)
Albumin: 4 g/dL (ref 3.5–5.2)
BUN: 16 mg/dL (ref 6–23)
CALCIUM: 9.9 mg/dL (ref 8.4–10.5)
CO2: 25 mEq/L (ref 19–32)
CREATININE: 1.12 mg/dL (ref 0.50–1.35)
Chloride: 99 mEq/L (ref 96–112)
GFR calc non Af Amer: 78 mL/min — ABNORMAL LOW (ref >90)
GLUCOSE: 169 mg/dL — AB (ref 70–99)
Potassium: 4.1 mEq/L (ref 3.7–5.3)
Sodium: 139 mEq/L (ref 137–147)
TOTAL PROTEIN: 7.6 g/dL (ref 6.0–8.3)
Total Bilirubin: 0.4 mg/dL (ref 0.3–1.2)

## 2013-07-16 LAB — I-STAT TROPONIN, ED: TROPONIN I, POC: 0.03 ng/mL (ref 0.00–0.08)

## 2013-07-16 MED ORDER — HYDROCHLOROTHIAZIDE 25 MG PO TABS: 25.0000 mg | ORAL_TABLET | Freq: Every day | ORAL | Status: DC

## 2013-07-16 MED ORDER — CLONIDINE HCL 0.2 MG PO TABS
0.2000 mg | ORAL_TABLET | Freq: Once | ORAL | Status: AC
  Administered 2013-07-16: 0.2 mg via ORAL
  Filled 2013-07-16: qty 1

## 2013-07-16 MED ORDER — LISINOPRIL 20 MG PO TABS
20.0000 mg | ORAL_TABLET | Freq: Once | ORAL | Status: AC
  Administered 2013-07-16: 20 mg via ORAL
  Filled 2013-07-16: qty 1

## 2013-07-16 MED ORDER — CLONIDINE HCL 0.2 MG PO TABS: 0.2000 mg | ORAL_TABLET | Freq: Two times a day (BID) | ORAL | Status: DC

## 2013-07-16 MED ORDER — LISINOPRIL 20 MG PO TABS: 20.0000 mg | ORAL_TABLET | Freq: Every day | ORAL | Status: DC

## 2013-07-16 MED ORDER — HYDROCHLOROTHIAZIDE 25 MG PO TABS
25.0000 mg | ORAL_TABLET | Freq: Once | ORAL | Status: AC
  Administered 2013-07-16: 25 mg via ORAL
  Filled 2013-07-16: qty 1

## 2013-07-16 NOTE — ED Provider Notes (Signed)
CSN: [id number]     Arrival date & time 07/16/13  1430 History   First MD Initiated Contact with Patient 07/16/13 1458     Chief Complaint  Patient presents with  . Hypertension  . Abnormal ECG     (Consider location/radiation/quality/duration/timing/severity/associated sxs/prior Treatment) HPI Comments: Out of HTN meds. Seen by Urgent Care, sent here for HAs, blurry vision. Patient denies these during my exam multiple times.  Patient is a 43 y.o. male presenting with hypertension. The history is provided by the patient.  Hypertension This is a chronic problem. The current episode started more than 1 week ago. The problem occurs constantly. The problem has not changed since onset.Pertinent negatives include no chest pain, no abdominal pain and no shortness of breath. Nothing aggravates the symptoms. Nothing relieves the symptoms.    Past Medical History  Diagnosis Date  . Hypertension   . Bipolar 2 disorder   . Anxiety   . Depression   . Seizures   . Pneumothorax     as a baby in the 3170's   History reviewed. No pertinent past surgical history. Family History  Problem Relation Age of Onset  . Diabetes type II    . Diabetes type II Mother    History  Substance Use Topics  . Smoking status: Current Every Day Smoker -- 0.20 packs/day    Types: Cigarettes  . Smokeless tobacco: Not on file  . Alcohol Use: 1.2 oz/week    2 Cans of beer per week     Comment: 1 40oz Fri. and Sat.    Review of Systems  Constitutional: Negative for fever and chills.  Respiratory: Negative for cough and shortness of breath.   Cardiovascular: Negative for chest pain and leg swelling.  Gastrointestinal: Negative for vomiting and abdominal pain.  Neurological: Negative for dizziness.  All other systems reviewed and are negative.     Allergies  Review of patient's allergies indicates no known allergies.  Home Medications   Prior to Admission medications   Medication Sig Start Date End  Date Taking? Authorizing Provider  cloNIDine (CATAPRES) 0.2 MG tablet Take 0.2 mg by mouth 2 (two) times daily.   Yes Historical Provider, MD  divalproex (DEPAKOTE) 500 MG DR tablet Take 500 mg by mouth 2 (two) times daily.   Yes Historical Provider, MD  hydrochlorothiazide (HYDRODIURIL) 25 MG tablet Take 25 mg by mouth daily.   Yes Historical Provider, MD  lisinopril (PRINIVIL,ZESTRIL) 20 MG tablet Take 20 mg by mouth daily.   Yes Historical Provider, MD   BP 171/118  Pulse 73  Temp(Src) 97.9 F (36.6 C) (Oral)  Resp 14  Ht 5\' 5"  (1.651 m)  Wt 125 lb (56.7 kg)  BMI 20.80 kg/m2  SpO2 97% Physical Exam  Nursing note and vitals reviewed. Constitutional: He is oriented to person, place, and time. He appears well-developed and well-nourished. No distress.  HENT:  Head: Normocephalic and atraumatic.  Mouth/Throat: No oropharyngeal exudate.  Eyes: EOM are normal. Pupils are equal, round, and reactive to light.  Neck: Normal range of motion. Neck supple.  Cardiovascular: Normal rate and regular rhythm.  Exam reveals no friction rub.   No murmur heard. Pulmonary/Chest: Effort normal and breath sounds normal. No respiratory distress. He has no wheezes. He has no rales.  Abdominal: Soft. He exhibits no distension. There is no tenderness. There is no rebound.  Musculoskeletal: Normal range of motion. He exhibits no edema.  Neurological: He is alert and oriented to person, place, and  time. No cranial nerve deficit. He exhibits normal muscle tone.  Skin: He is not diaphoretic.    ED Course  Procedures (including critical care time) Labs Review Labs Reviewed  COMPREHENSIVE METABOLIC PANEL - Abnormal; Notable for the following:    Glucose, Bld 169 (*)    GFR calc non Af Amer 78 (*)    All other components within normal limits  CBC WITH DIFFERENTIAL  I-STAT TROPOININ, ED    Imaging Review No results found.   EKG Interpretation   Date/Time:  Wednesday Jul 16 2013 14:34:58  EDT Ventricular Rate:  90 PR Interval:  102 QRS Duration: 82 QT Interval:  392 QTC Calculation: 479 R Axis:   10 Text Interpretation:  Sinus rhythm with short PR Minimal voltage criteria  for LVH, may be normal variant Anterior infarct , age undetermined ST \\T \  T wave abnormality, consider lateral ischemia Abnormal ECG No prior for  comparison Confirmed by Gwendolyn GrantWALDEN  MD, Talli Kimmer ([id number]) on 07/16/2013 3:47:39 PM      MDM   Final diagnoses:  High blood pressure    43 year old male presents with hypertension. He was sent from urgent care for concerns of his elevated blood pressure. Patient went his PCP for blood pressure medications as he ran out of his own supply and was not given any. Her pressure still elevated today and he came to urgent care for these medications. He is here for high blood pressures and concern for EKG changes. Patient is lacking in bed denying any chest pain, shortness of breath, nausea, vomiting, diarrhea. He denies any headaches or vision changes. Chart from Urgent Care states HAs and vision changes, however he denied it when I brought this up.  Will give HTN meds here and observe to see if his pressures come down. Initially 170s/110s on my exam. Home meds given.  BPs improved. Given 1 month supply of HTN meds. Instructed to f/u with his doctor.  Dagmar HaitWilliam Reigan Tolliver, MD 07/16/13 2006

## 2013-07-16 NOTE — ED Notes (Signed)
Pt went to dr yesterday for his HTMN meds was not given any rx today he went to [facility] for meds and  His ekg had changes on it  No cp

## 2013-07-16 NOTE — ED Notes (Signed)
Security called to transfer pt.

## 2013-07-16 NOTE — ED Notes (Signed)
He went to see his doctor yesterday-Dr. Mayford KnifeWilliams on [address]  He was supposed come back today but they were closed.  Was not given a Rx. No chest pain or headache.  Had headache yesterday.

## 2013-07-16 NOTE — Discharge Instructions (Signed)
Arterial Hypertension °Arterial hypertension (high blood pressure) is a condition of elevated pressure in your blood vessels. Hypertension over a long period of time is a risk factor for strokes, heart attacks, and heart failure. It is also the leading cause of kidney (renal) failure.  °CAUSES  °· In Adults -- Over 90% of all hypertension has no known cause. This is called essential or primary hypertension. In the other 10% of people with hypertension, the increase in blood pressure is caused by another disorder. This is called secondary hypertension. Important causes of secondary hypertension are: °· Heavy alcohol use. °· Obstructive sleep apnea. °· Hyperaldosterosim (Conn's syndrome). °· Steroid use. °· Chronic kidney failure. °· Hyperparathyroidism. °· Medications. °· Renal artery stenosis. °· Pheochromocytoma. °· Cushing's disease. °· Coarctation of the aorta. °· Scleroderma renal crisis. °· Licorice (in excessive amounts). °· Drugs (cocaine, methamphetamine). °Your caregiver can explain any items above that apply to you. °· In Children -- Secondary hypertension is more common and should always be considered. °· Pregnancy -- Few women of childbearing age have high blood pressure. However, up to 10% of them develop hypertension of pregnancy. Generally, this will not harm the woman. It may be a sign of 3 complications of pregnancy: preeclampsia, HELLP syndrome, and eclampsia. Follow up and control with medication is necessary. °SYMPTOMS  °· This condition normally does not produce any noticeable symptoms. It is usually found during a routine exam. °· Malignant hypertension is a late problem of high blood pressure. It may have the following symptoms: °· Headaches. °· Blurred vision. °· End-organ damage (this means your kidneys, heart, lungs, and other organs are being damaged). °· Stressful situations can increase the blood pressure. If a person with normal blood pressure has their blood pressure go up while being  seen by their caregiver, this is often termed "white coat hypertension." Its importance is not known. It may be related with eventually developing hypertension or complications of hypertension. °· Hypertension is often confused with mental tension, stress, and anxiety. °DIAGNOSIS  °The diagnosis is made by 3 separate blood pressure measurements. They are taken at least 1 week apart from each other. If there is organ damage from hypertension, the diagnosis may be made without repeat measurements. °Hypertension is usually identified by having blood pressure readings: °· Above 140/90 mmHg measured in both arms, at 3 separate times, over a couple weeks. °· Over 130/80 mmHg should be considered a risk factor and may require treatment in patients with diabetes. °Blood pressure readings over 120/80 mmHg are called "pre-hypertension" even in non-diabetic patients. °To get a true blood pressure measurement, use the following guidelines. Be aware of the factors that can alter blood pressure readings. °· Take measurements at least 1 hour after caffeine. °· Take measurements 30 minutes after smoking and without any stress. This is another reason to quit smoking  it raises your blood pressure. °· Use a proper cuff size. Ask your caregiver if you are not sure about your cuff size. °· Most home blood pressure cuffs are automatic. They will measure systolic and diastolic pressures. The systolic pressure is the pressure reading at the start of sounds. Diastolic pressure is the pressure at which the sounds disappear. If you are elderly, measure pressures in multiple postures. Try sitting, lying or standing. °· Sit at rest for a minimum of 5 minutes before taking measurements. °· You should not be on any medications like decongestants. These are found in many cold medications. °· Record your blood pressure readings and review   them with your caregiver. °If you have hypertension: °· Your caregiver may do tests to be sure you do not have  secondary hypertension (see "causes" above). °· Your caregiver may also look for signs of metabolic syndrome. This is also called Syndrome X or Insulin Resistance Syndrome. You may have this syndrome if you have type 2 diabetes, abdominal obesity, and abnormal blood lipids in addition to hypertension. °· Your caregiver will take your medical and family history and perform a physical exam. °· Diagnostic tests may include blood tests (for glucose, cholesterol, potassium, and kidney function), a urinalysis, or an EKG. Other tests may also be necessary depending on your condition. °PREVENTION  °There are important lifestyle issues that you can adopt to reduce your chance of developing hypertension: °· Maintain a normal weight. °· Limit the amount of salt (sodium) in your diet. °· Exercise often. °· Limit alcohol intake. °· Get enough potassium in your diet. Discuss specific advice with your caregiver. °· Follow a DASH diet (dietary approaches to stop hypertension). This diet is rich in fruits, vegetables, and low-fat dairy products, and avoids certain fats. °PROGNOSIS  °Essential hypertension cannot be cured. Lifestyle changes and medical treatment can lower blood pressure and reduce complications. The prognosis of secondary hypertension depends on the underlying cause. Many people whose hypertension is controlled with medicine or lifestyle changes can live a normal, healthy life.  °RISKS AND COMPLICATIONS  °While high blood pressure alone is not an illness, it often requires treatment due to its short- and long-term effects on many organs. Hypertension increases your risk for: °· CVAs or strokes (cerebrovascular accident). °· Heart failure due to chronically high blood pressure (hypertensive cardiomyopathy). °· Heart attack (myocardial infarction). °· Damage to the retina (hypertensive retinopathy). °· Kidney failure (hypertensive nephropathy). °Your caregiver can explain list items above that apply to you. Treatment  of hypertension can significantly reduce the risk of complications. °TREATMENT  °· For overweight patients, weight loss and regular exercise are recommended. Physical fitness lowers blood pressure. °· Mild hypertension is usually treated with diet and exercise. A diet rich in fruits and vegetables, fat-free dairy products, and foods low in fat and salt (sodium) can help lower blood pressure. Decreasing salt intake decreases blood pressure in a 1/3 of people. °· Stop smoking if you are a smoker. °The steps above are highly effective in reducing blood pressure. While these actions are easy to suggest, they are difficult to achieve. Most patients with moderate or severe hypertension end up requiring medications to bring their blood pressure down to a normal level. There are several classes of medications for treatment. Blood pressure pills (antihypertensives) will lower blood pressure by their different actions. Lowering the blood pressure by 10 mmHg may decrease the risk of complications by as much as 25%. °The goal of treatment is effective blood pressure control. This will reduce your risk for complications. Your caregiver will help you determine the best treatment for you according to your lifestyle. What is excellent treatment for one person, may not be for you. °HOME CARE INSTRUCTIONS  °· Do not smoke. °· Follow the lifestyle changes outlined in the "Prevention" section. °· If you are on medications, follow the directions carefully. Blood pressure medications must be taken as prescribed. Skipping doses reduces their benefit. It also puts you at risk for problems. °· Follow up with your caregiver, as directed. °· If you are asked to monitor your blood pressure at home, follow the guidelines in the "Diagnosis" section above. °SEEK MEDICAL CARE   IF:  °· You think you are having medication side effects. °· You have recurrent headaches or lightheadedness. °· You have swelling in your ankles. °· You have trouble with  your vision. °SEEK IMMEDIATE MEDICAL CARE IF:  °· You have sudden onset of chest pain or pressure, difficulty breathing, or other symptoms of a heart attack. °· You have a severe headache. °· You have symptoms of a stroke (such as sudden weakness, difficulty speaking, difficulty walking). °MAKE SURE YOU:  °· Understand these instructions. °· Will watch your condition. °· Will get help right away if you are not doing well or get worse. °Document Released: 02/20/2005 Document Revised: 05/15/2011 Document Reviewed: 09/20/2006 °[employer]® Patient Information ©2014 [employer]. ° °

## 2013-07-16 NOTE — ED Provider Notes (Signed)
CSN: [id number]     Arrival date & time 07/16/13  1145 History   None    No chief complaint on file.  (Consider location/radiation/quality/duration/timing/severity/associated sxs/prior Treatment) Patient is a 43 y.o. male presenting with hypertension. The history is provided by the patient.  Hypertension This is a chronic problem. The problem occurs constantly. Associated symptoms include headaches (yesterday). Pertinent negatives include no chest pain, no abdominal pain and no shortness of breath. Nothing aggravates the symptoms. Nothing relieves the symptoms. He has tried nothing for the symptoms.   Brian GaribaldiRandall Lucero is a 43 y.o.  Male who presents to the ED with hypertension. He states that he went to his doctor yesterday with a headache and his BP was high. Is is out of his medication but his doctor told him yesterday to return today for recheck. He returned today and the office was closed. The doctor did not refill his Rx yesterday. This morning the patient states he was still feeling bad and seeing little specks of light before his eyes. He has been off his medication x 1 week.   Past Medical History  Diagnosis Date  . Hypertension   . Bipolar 2 disorder   . Anxiety   . Depression    No past surgical history on file. Family History  Problem Relation Age of Onset  . Diabetes type II    . Diabetes type II Mother    History  Substance Use Topics  . Smoking status: Current Every Day Smoker -- 0.50 packs/day    Types: Cigarettes  . Smokeless tobacco: Not on file  . Alcohol Use: 1.2 oz/week    2 Cans of beer per week     Comment: 1 40oz/day    Review of Systems  Respiratory: Negative for shortness of breath.   Cardiovascular: Negative for chest pain.  Gastrointestinal: Negative for abdominal pain.  Neurological: Positive for headaches (yesterday).    Allergies  Review of patient's allergies indicates no known allergies.  Home Medications   Prior to Admission medications    Medication Sig Start Date End Date Taking? Authorizing Provider  cloNIDine (CATAPRES) 0.2 MG tablet Take 0.2 mg by mouth 2 (two) times daily.    Historical Provider, MD  hydrochlorothiazide (HYDRODIURIL) 25 MG tablet Take 25 mg by mouth daily.    Historical Provider, MD  levETIRAcetam (KEPPRA) 500 MG tablet Take 1 tablet (500 mg total) by mouth 2 (two) times daily. 11/19/11 11/18/12  Calvert CantorSaima Rizwan, MD  lisinopril (PRINIVIL,ZESTRIL) 20 MG tablet Take 20 mg by mouth daily.    Historical Provider, MD   BP 187/133  Pulse 105  Temp(Src) 98.6 F (37 C) (Oral)  Resp 18  SpO2 95% Physical Exam  Nursing note and vitals reviewed. Constitutional: He is oriented to person, place, and time. He appears well-developed and well-nourished. No distress.  HENT:  Head: Normocephalic and atraumatic.  Mouth/Throat: Uvula is midline, oropharynx is clear and moist and mucous membranes are normal.  No facial droop  Eyes: Conjunctivae and EOM are normal. Pupils are equal, round, and reactive to light.  Neck: Neck supple.  Cardiovascular: Tachycardia present.   Pulmonary/Chest: Effort normal. He has no wheezes. He has no rales.  Abdominal: Soft. There is no tenderness.  Musculoskeletal: Normal range of motion.  Left hand forearm deformity  Neurological: He is alert and oriented to person, place, and time. He has normal strength. No cranial nerve deficit or sensory deficit. Gait normal.  Skin: Skin is warm and dry.  Psychiatric: He  has a normal mood and affect. His behavior is normal.    ED Course: Dr. Lorenz CoasterKeller reviewed EKG, will transfer patient to the ED.  Procedures   MDM  43 y.o. male with hypertension, visual changes and headache yesterday. Patient stable [facility].     Janne NapoleonHope M Kiyoto Slomski, [place]NP 07/16/13 1415

## 2013-07-19 NOTE — ED Provider Notes (Signed)
Medical screening examination/treatment/procedure(s) were performed by non-physician practitioner and as supervising physician I was immediately available for consultation/collaboration.  Ewart Carrera, M.D.  Kalila Adkison C Jaydynn Wolford, MD 07/19/13 0829 

## 2016-06-11 ENCOUNTER — Encounter (HOSPITAL_COMMUNITY): Payer: Self-pay | Admitting: Mental Health

## 2016-06-11 ENCOUNTER — Emergency Department (HOSPITAL_COMMUNITY): Payer: Medicare HMO

## 2016-06-11 ENCOUNTER — Emergency Department (HOSPITAL_COMMUNITY)
Admission: EM | Admit: 2016-06-11 | Discharge: 2016-06-12 | Disposition: A | Discharge status: Other Acute Inpatient Hospital | Payer: Medicare HMO | Attending: Emergency Medicine | Admitting: Emergency Medicine

## 2016-06-11 DIAGNOSIS — F17210 Nicotine dependence, cigarettes, uncomplicated: Secondary | ICD-10-CM | POA: Diagnosis not present

## 2016-06-11 DIAGNOSIS — I10 Essential (primary) hypertension: Secondary | ICD-10-CM | POA: Insufficient documentation

## 2016-06-11 DIAGNOSIS — R05 Cough: Secondary | ICD-10-CM | POA: Diagnosis present

## 2016-06-11 DIAGNOSIS — J181 Lobar pneumonia, unspecified organism: Secondary | ICD-10-CM | POA: Insufficient documentation

## 2016-06-11 DIAGNOSIS — J189 Pneumonia, unspecified organism: Secondary | ICD-10-CM

## 2016-06-11 DIAGNOSIS — Z79899 Other long term (current) drug therapy: Secondary | ICD-10-CM | POA: Diagnosis not present

## 2016-06-11 DIAGNOSIS — F333 Major depressive disorder, recurrent, severe with psychotic symptoms: Secondary | ICD-10-CM | POA: Diagnosis not present

## 2016-06-11 DIAGNOSIS — F1420 Cocaine dependence, uncomplicated: Secondary | ICD-10-CM | POA: Diagnosis present

## 2016-06-11 LAB — CBC WITH DIFFERENTIAL/PLATELET
BASOS ABS: 0 10*3/uL (ref 0.0–0.1)
Basophils Relative: 0 %
EOS PCT: 1 %
Eosinophils Absolute: 0.1 10*3/uL (ref 0.0–0.7)
HCT: 43.5 % (ref 39.0–52.0)
Hemoglobin: 15.3 g/dL (ref 13.0–17.0)
LYMPHS PCT: 16 %
Lymphs Abs: 1.4 10*3/uL (ref 0.7–4.0)
MCH: 30.4 pg (ref 26.0–34.0)
MCHC: 35.2 g/dL (ref 30.0–36.0)
MCV: 86.3 fL (ref 78.0–100.0)
MONOS PCT: 10 %
Monocytes Absolute: 0.9 10*3/uL (ref 0.1–1.0)
Neutro Abs: 6.8 10*3/uL (ref 1.7–7.7)
Neutrophils Relative %: 73 %
PLATELETS: 195 10*3/uL (ref 150–400)
RBC: 5.04 MIL/uL (ref 4.22–5.81)
RDW: 13.4 % (ref 11.5–15.5)
WBC: 9.3 10*3/uL (ref 4.0–10.5)

## 2016-06-11 LAB — BASIC METABOLIC PANEL
ANION GAP: 9 (ref 5–15)
BUN: 15 mg/dL (ref 6–20)
CALCIUM: 9.5 mg/dL (ref 8.9–10.3)
CO2: 29 mmol/L (ref 22–32)
CREATININE: 1.19 mg/dL (ref 0.61–1.24)
Chloride: 99 mmol/L — ABNORMAL LOW (ref 101–111)
GFR calc Af Amer: >60 mL/min (ref >60)
GLUCOSE: 123 mg/dL — AB (ref 65–99)
Potassium: 3.9 mmol/L (ref 3.5–5.1)
Sodium: 137 mmol/L (ref 135–145)

## 2016-06-11 LAB — ACETAMINOPHEN LEVEL: Acetaminophen (Tylenol), Serum: <10 ug/mL — ABNORMAL LOW (ref 10–30)

## 2016-06-11 LAB — SALICYLATE LEVEL

## 2016-06-11 LAB — ETHANOL: Alcohol, Ethyl (B): <5 mg/dL (ref <5)

## 2016-06-11 MED ORDER — NICOTINE 21 MG/24HR TD PT24
21.0000 mg | MEDICATED_PATCH | Freq: Every day | TRANSDERMAL | Status: DC
Start: 2016-06-11 — End: 2016-06-12
  Administered 2016-06-11: 21 mg via TRANSDERMAL
  Filled 2016-06-11 (×2): qty 1

## 2016-06-11 MED ORDER — LEVOFLOXACIN 750 MG PO TABS
750.0000 mg | ORAL_TABLET | Freq: Every day | ORAL | Status: DC
  Filled 2016-06-11: qty 1

## 2016-06-11 MED ORDER — ACETAMINOPHEN 325 MG PO TABS: 650.0000 mg | ORAL_TABLET | ORAL | Status: DC | PRN

## 2016-06-11 MED ORDER — ALBUTEROL SULFATE HFA 108 (90 BASE) MCG/ACT IN AERS
2.0000 | INHALATION_SPRAY | Freq: Once | RESPIRATORY_TRACT | Status: AC
  Administered 2016-06-11: 2 via RESPIRATORY_TRACT
  Filled 2016-06-11: qty 6.7

## 2016-06-11 MED ORDER — AEROCHAMBER PLUS FLO-VU MEDIUM MISC
1.0000 | Freq: Once | Status: AC
  Administered 2016-06-11: 1
  Filled 2016-06-11: qty 1

## 2016-06-11 MED ORDER — ALBUTEROL SULFATE HFA 108 (90 BASE) MCG/ACT IN AERS
2.0000 | INHALATION_SPRAY | RESPIRATORY_TRACT | Status: DC
  Administered 2016-06-11 – 2016-06-12 (×5): 2 via RESPIRATORY_TRACT
  Filled 2016-06-11: qty 6.7

## 2016-06-11 MED ORDER — LEVOFLOXACIN 750 MG PO TABS
750.0000 mg | ORAL_TABLET | Freq: Once | ORAL | Status: AC
  Administered 2016-06-11: 750 mg via ORAL
  Filled 2016-06-11: qty 1

## 2016-06-11 MED ORDER — LISINOPRIL 20 MG PO TABS
20.0000 mg | ORAL_TABLET | Freq: Every day | ORAL | Status: DC
  Administered 2016-06-11 – 2016-06-12 (×2): 20 mg via ORAL
  Filled 2016-06-11 (×2): qty 1

## 2016-06-11 MED ORDER — SERTRALINE HCL 50 MG PO TABS
100.0000 mg | ORAL_TABLET | Freq: Three times a day (TID) | ORAL | Status: DC
  Administered 2016-06-11 – 2016-06-12 (×2): 100 mg via ORAL
  Filled 2016-06-11 (×2): qty 2

## 2016-06-11 MED ORDER — HYDROCHLOROTHIAZIDE 25 MG PO TABS
25.0000 mg | ORAL_TABLET | Freq: Every day | ORAL | Status: DC
  Administered 2016-06-11 – 2016-06-12 (×2): 25 mg via ORAL
  Filled 2016-06-11 (×2): qty 1

## 2016-06-11 MED ORDER — DIVALPROEX SODIUM 500 MG PO DR TAB
500.0000 mg | DELAYED_RELEASE_TABLET | Freq: Two times a day (BID) | ORAL | Status: DC
  Administered 2016-06-11 – 2016-06-12 (×2): 500 mg via ORAL
  Filled 2016-06-11 (×2): qty 1

## 2016-06-11 MED ORDER — IPRATROPIUM-ALBUTEROL 0.5-2.5 (3) MG/3ML IN SOLN
3.0000 mL | Freq: Once | RESPIRATORY_TRACT | Status: AC
  Administered 2016-06-11: 3 mL via RESPIRATORY_TRACT
  Filled 2016-06-11: qty 3

## 2016-06-11 MED ORDER — LEVOFLOXACIN 750 MG PO TABS: 750.0000 mg | ORAL_TABLET | Freq: Every day | ORAL | Status: DC

## 2016-06-11 MED ORDER — ONDANSETRON HCL 4 MG PO TABS: 4.0000 mg | ORAL_TABLET | Freq: Three times a day (TID) | ORAL | Status: DC | PRN

## 2016-06-11 MED ORDER — LEVOFLOXACIN 750 MG PO TABS: 750.0000 mg | ORAL_TABLET | Freq: Every day | ORAL | 0 refills | Status: DC

## 2016-06-11 MED ORDER — SODIUM CHLORIDE 0.9 % IV BOLUS (SEPSIS)
1000.0000 mL | Freq: Once | INTRAVENOUS | Status: AC
  Administered 2016-06-11: 1000 mL via INTRAVENOUS

## 2016-06-11 MED ORDER — LEVOFLOXACIN 750 MG PO TABS: 750.0000 mg | ORAL_TABLET | Freq: Every day | ORAL | 0 refills | Status: AC

## 2016-06-11 NOTE — ED Notes (Signed)
Bed: ZO10 Expected date:  Expected time:  Means of arrival:  Comments: 46 yo cough

## 2016-06-11 NOTE — ED Provider Notes (Signed)
[facility]-EMERGENCY DEPT Provider Note   CSN: [id number] Arrival date & time: 06/11/16  1032     History   Chief Complaint Chief Complaint  Patient presents with  . Cough    couple of weeks coughing    HPI Brian Lucero is a 46 y.o. male.  The history is provided by the patient.  Cough  This is a new problem. Episode onset: 2 weeks ago. The problem occurs constantly. The problem has been gradually worsening. The cough is non-productive. Maximum temperature: unmeasured. Associated symptoms include chills and shortness of breath (with ambulation). He has tried nothing for the symptoms. He is a smoker.    Past Medical History:  Diagnosis Date  . Anxiety   . Bipolar 2 disorder   . Depression   . Hypertension   . Pneumothorax    as a baby in the 46's  . Seizures     Patient Active Problem List   Diagnosis Date Noted  . Seizure (HCC) 11/17/2011  . Tobacco abuse 11/17/2011  . Alcohol abuse 11/17/2011  . Cocaine abuse 11/17/2011  . Bipolar 2 disorder (HCC) 11/17/2011  . Hypertension 11/17/2011  . Hydrocephalus 11/17/2011  . Aspiration pneumonia (HCC) 11/17/2011    No past surgical history on file.     Home Medications    Prior to Admission medications   Medication Sig Start Date End Date Taking? Authorizing Provider  divalproex (DEPAKOTE) 500 MG DR tablet Take 500 mg by mouth 2 (two) times daily.   Yes Historical Provider, MD  hydrochlorothiazide (HYDRODIURIL) 25 MG tablet Take 1 tablet (25 mg total) by mouth daily. 07/16/13  Yes Elwin Mocha, MD  lisinopril (PRINIVIL,ZESTRIL) 20 MG tablet Take 1 tablet (20 mg total) by mouth daily. 07/16/13  Yes Elwin Mocha, MD  sertraline (ZOLOFT) 100 MG tablet Take 100 mg by mouth 3 (three) times daily.   Yes Historical Provider, MD    Family History Family History  Problem Relation Age of Onset  . Diabetes type II    . Diabetes type II Mother     Social History Social History  Substance Use Topics  . Smoking status:  Current Every Day Smoker    Packs/day: 0.20    Types: Cigarettes  . Smokeless tobacco: Not on file  . Alcohol use 1.2 oz/week    2 Cans of beer per week     Comment: 1 40oz Fri. and Sat.     Allergies   Patient has no known allergies.   Review of Systems Review of Systems  Constitutional: Positive for chills.  Respiratory: Positive for cough and shortness of breath (with ambulation).   All other systems reviewed and are negative.    Physical Exam Updated Vital Signs BP (!) 180/127   Pulse (!) 101   Temp 99 F (37.2 C) (Oral)   Resp (!) 24   SpO2 95%   Physical Exam  Constitutional: He is oriented to person, place, and time. He appears well-developed and well-nourished. No distress.  HENT:  Head: Normocephalic and atraumatic.  Nose: Nose normal.  Mouth/Throat: Oropharynx is clear and moist.  Eyes: Conjunctivae are normal. Pupils are equal, round, and reactive to light.  Neck: Neck supple. No tracheal deviation present.  Cardiovascular: Normal rate, regular rhythm and normal heart sounds.   Pulmonary/Chest: Effort normal. No accessory muscle usage. No respiratory distress. He has no decreased breath sounds. He has wheezes (expiratory). He has rhonchi (diffuse).  Abdominal: Soft. He exhibits no distension. There is no tenderness. There is  no guarding.  Neurological: He is alert and oriented to person, place, and time.  Skin: Skin is warm and dry.  Psychiatric: He has a normal mood and affect.  Vitals reviewed.    ED Treatments / Results  Labs (all labs ordered are listed, but only abnormal results are displayed) Labs Reviewed  BASIC METABOLIC PANEL - Abnormal; Notable for the following:       Result Value   Chloride 99 (*)    Glucose, Bld 123 (*)    All other components within normal limits  CBC WITH DIFFERENTIAL/PLATELET    EKG  EKG Interpretation None       Radiology Dg Chest 2 View  Result Date: 06/11/2016 CLINICAL DATA:  Cough and shortness of  breath for 2 weeks.  Smoker. EXAM: CHEST  2 VIEW COMPARISON:  Chest x-rays dated 11/09/2015 and 11/17/2011. FINDINGS: New airspace opacity at the left lung base, likely pneumonia. Lungs otherwise clear. Lungs are hyperexpanded. Heart size and mediastinal contours are stable. No acute or suspicious osseous finding. IMPRESSION: 1. New airspace opacity at the left lung base, most likely pneumonia. Recommend follow-up chest x-ray to ensure resolution. 2. Hyperexpanded lungs indicating COPD/emphysema. Electronically Signed   By: Bary Richard M.D.   On: 06/11/2016 12:14    Procedures Procedures (including critical care time)  Medications Ordered in ED Medications  ipratropium-albuterol (DUONEB) 0.5-2.5 (3) MG/3ML nebulizer solution 3 mL (3 mLs Nebulization Given 06/11/16 1212)  levofloxacin (LEVAQUIN) tablet 750 mg (750 mg Oral Given 06/11/16 1232)  albuterol (PROVENTIL HFA;VENTOLIN HFA) 108 (90 Base) MCG/ACT inhaler 2 puff (2 puffs Inhalation Given 06/11/16 1233)  AEROCHAMBER PLUS FLO-VU MEDIUM MISC 1 each (1 each Other Given 06/11/16 1233)  sodium chloride 0.9 % bolus 1,000 mL (0 mLs Intravenous Stopped 06/11/16 1617)     Initial Impression / Assessment and Plan / ED Course  I have reviewed the triage vital signs and the nursing notes.  Pertinent labs & imaging results that were available during my care of the patient were reviewed by me and considered in my medical decision making (see chart for details).     46 y.o. male presents with persistent cough last 2 weeks. He is mildly hypoxemic and low grade temp suggesting possible respiratory infection. LLL infiltrate is c/w CAP, given breathing treatments and po levaquin here with improvement. Given fluids for insensible losses. He will be discharged on a 7 day course of levaquin and albuterol PRN. No oxygen requirement here or other indication for admission. Plan to follow up with PCP as needed and return precautions discussed for worsening or new  concerning symptoms.   Final Clinical Impressions(s) / ED Diagnoses   Final diagnoses:  Community acquired pneumonia of left lower lobe of lung (HCC)    New Prescriptions Current Discharge Medication List    START taking these medications   Details  levofloxacin (LEVAQUIN) 750 MG tablet Take 1 tablet (750 mg total) by mouth daily. X 7 days Qty: 6 tablet, Refills: 0         Lyndal Pulley, MD 06/11/16 1810

## 2016-06-11 NOTE — ED Notes (Addendum)
When to discharge pt and give him his paper work but he said he need to talk to the doctor regarding his depression treatment. Pt state he knows the procedures that he will just go back to ED and get admit to the 5th floor.

## 2016-06-11 NOTE — ED Provider Notes (Signed)
At time of discharge, the patient informed nursing staff that he was suicidal. He advised that that was a large part of the reason for his visit. He reports that he meant to tell Dr. Hinda Glatter but he didn't get a chance to. He reports that he has been thinking about overdosing on his medications. He states he is afraid to be by himself because he doesn't want to die but he feels that he may follow through on this. He reports he tried to kill himself and 2013 by overdosing and had to stay in the hospital for several weeks. He reports this was at [facility].  The patient had been evaluated by Dr. Hinda Glatter for cough and chest x-ray identified a pneumonia. He had been cleared for discharge with oral Levaquin. I have reassessed the patient.  Patient is alert and nontoxic. No respiratory distress. Intermittent moist cough. Patient has rhonchi right base that cleared with cough. Good air flow throughout. Heart is regular. Patient has congenital anomaly of left upper extremity. No peripheral edema or calf tenderness. Mental status is alert and oriented with clear speech and normal cognitive function.  Patient is medically cleared for psychiatric evaluation for suicidal ideation with plan of overdose on medications.   Arby Barrette, MD 06/11/16 [phone]

## 2016-06-11 NOTE — ED Triage Notes (Signed)
Pt from home and try to walk to the ED went he go very SOB he then call EMS to Pick him up and brought to the ED. Pt state he had cough for two week and SOB. Pt was place on 2 L Bray  BP 190/130 P-100 O2- 96 % RA per EMS.

## 2016-06-11 NOTE — ED Notes (Signed)
Pt stated "I hear voices that tell me to kill myself, that I'm no good.  I was going to take all my pills, when I can find them.  I was going to [facility] and I don't have a medical doctor."

## 2016-06-11 NOTE — ED Notes (Signed)
SBAR Report received from previous nurse. Pt received calm and visible on unit. Pt endorses current SI, A/V H , depression, and anxiety, pt non comital on SI plan, and was elusive when asked if he would contract for safety. Pt was reminded that he came here for voluntarily and this is how we help. Pt denies pain and HI at this time, and appears otherwise stable and free of distress. Pt reminded of camera surveillance, q 15 min rounds, and rules of the milieu. Will continue to assess. Pt already asleep.

## 2016-06-11 NOTE — ED Notes (Signed)
Pt ambulated with his baseline gait to the restroom with stand by assistance only.

## 2016-06-11 NOTE — BH Assessment (Addendum)
Tele Assessment Note   Brian Lucero is an 46 y.o. male, who presents voluntarily and unaccompanied. Pt reported, he came to [facility] because he had a cough for two weeks and current suicidal thoughts. Pt reported, having suicidal thoughts since Tuesday (06/06/2016). Pt reported, he is at the point where he just may carry out his plan. Pt reported, wanting to overdose on his medications. Pt reported, he attempted suicide several times. Pt reported, in 2013 he overdosed on his medications. Pt reported, hearing his own voice since Thursday (06/08/2016) telling him: "I'm no good, kill myself." Pt reported, the voice comes more and more frequently. Pt reported, he used to burn himself with cigarettes. Pt reported, he last self-harm when he was 46 years old. Pt reported, experiencing the following depressive symptoms: "tearfulness, excessive guilt (pt reported: "who I am, listening to my voice."), feeling hopeless/worthless, isolating, and irritability. Pt denied HI and self-injurious behaviors.   Pt denied abuse. Pt reported, hitting a blunt on Tuesday (06/06/2016). Pt's UDS pending. Pt denied being linked to OPT resources. Pt reported, his primary caregiver prescribed his medications. Pt reported, taking his medication as prescribed. Pt reported, previous inpatient admissions.   Pt presented alert with logical/coherent speech. Pt's eye contact is fair. Pt's thought process is relevant/coherent. Pt's mood/affect is sad. Pt's judgement is unimpaired. Pt's concentration is normal. Pt's insight and impulse control are fair. Pt was oriented x3 (year, city and state). Pt reported, if discharged from [facility] he could not contract for safety. Pt reported, if inpatient treatment was recommended he would sign-in voluntary.   Diagnosis: Bi-polar 2 Disorder (HCC)   Past Medical History:  Past Medical History:  Diagnosis Date  . Anxiety   . Bipolar 2 disorder (HCC)   . Depression   . Hypertension   . Pneumothorax    as  a baby in the 65's  . Seizures (HCC)     No past surgical history on file.  Family History:  Family History  Problem Relation Age of Onset  . Diabetes type II Mother   . Diabetes type II      Social History:  reports that he has been smoking Cigarettes.  He has been smoking about 0.20 packs per day. He does not have any smokeless tobacco history on file. He reports that he drinks about 1.2 oz of alcohol per week . He reports that he uses drugs, including Cocaine.  Additional Social History:  Alcohol / Drug Use Pain Medications: See MAR Prescriptions: See MAR Over the Counter: See MAR History of alcohol / drug use?: Yes Substance #1 Name of Substance 1: Marijuana 1 - Age of First Use: UTA 1 - Amount (size/oz): Pt reported, hitting a blunt Tuesday,  1 - Frequency: UTA 1 - Duration: UTA 1 - Last Use / Amount: Pt reported, Tuesday.  CIWA: CIWA-Ar BP: (!) 152/92 Pulse Rate: 97 COWS:    PATIENT STRENGTHS: (choose at least two) Average or above average intelligence General fund of knowledge  Allergies: No Known Allergies  Home Medications:  (Not in a hospital admission)  OB/GYN Status:  No LMP for male patient.  General Assessment Data Location of Assessment: [facility] ED TTS Assessment: In system Is this a Tele or Face-to-Face Assessment?: Face-to-Face Is this an Initial Assessment or a Re-assessment for this encounter?: Initial Assessment Marital status: Separated Is patient pregnant?: No Pregnancy Status: No Living Arrangements: Alone Can pt return to current living arrangement?: Yes Admission Status: Voluntary Is patient capable of signing voluntary admission?: Yes  Referral Source: Self/Family/Friend Insurance type: Medicare     Crisis Care Plan Living Arrangements: Alone Legal Guardian: Other: (Self) Name of Psychiatrist: NA Name of Therapist: NA  Education Status Is patient currently in school?: No Current Grade: NA Highest grade of school patient has  completed: GED Name of school: NA Contact person: NA  Risk to self with the past 6 months Suicidal Ideation: Yes-Currently Present Has patient been a risk to self within the past 6 months prior to admission? : No Suicidal Intent: Yes-Currently Present Has patient had any suicidal intent within the past 6 months prior to admission? : No Is patient at risk for suicide?: Yes Suicidal Plan?: Yes-Currently Present Has patient had any suicidal plan within the past 6 months prior to admission? : Yes Specify Current Suicidal Plan: Pt reported, overdosing on his medications.  Access to Means: Yes Specify Access to Suicidal Means: Pt reported, using his medications.  What has been your use of drugs/alcohol within the last 12 months?: Marijuana.  Previous Attempts/Gestures: Yes How many times?:  (Pt reported, several times. ) Other Self Harm Risks: Pt reported, he used to burn himself with cigarettes until he was 21.  Triggers for Past Attempts: Unpredictable Family Suicide History: No Recent stressful life event(s): Other (Comment) (feeling guilty, listening to the voices. ) Persecutory voices/beliefs?: Yes Depression: Yes Depression Symptoms: Tearfulness, Isolating, Guilt, Feeling worthless/self pity, Feeling angry/irritable Substance abuse history and/or treatment for substance abuse?: Yes Suicide prevention information given to non-admitted patients: Not applicable  Risk to Others within the past 6 months Homicidal Ideation: No Does patient have any lifetime risk of violence toward others beyond the six months prior to admission? : No Thoughts of Harm to Others: No Current Homicidal Intent: No Current Homicidal Plan: No Access to Homicidal Means: No Identified Victim: NA History of harm to others?: No Assessment of Violence: None Noted Violent Behavior Description: NA Does patient have access to weapons?: Yes (Comment) (Pt reported, knives. ) Criminal Charges Pending?: No Does  patient have a court date: No Is patient on probation?: No  Psychosis Hallucinations: Auditory Delusions: None noted  Mental Status Report Appearance/Hygiene: Disheveled Eye Contact: Fair Motor Activity: Freedom of movement Speech: Logical/coherent Level of Consciousness: Alert Mood: Sad Affect: Sad Anxiety Level: None Thought Processes: Relevant, Coherent Judgement: Unimpaired Orientation: Other (Comment) (year, city and state. ) Obsessive Compulsive Thoughts/Behaviors: None  Cognitive Functioning Concentration: Normal Memory: Recent Intact IQ: Average Insight: Fair Impulse Control: Fair Appetite: Poor Sleep: Decreased Total Hours of Sleep: 3 Vegetative Symptoms: Staying in bed, Decreased grooming  ADLScreening [facility] Assessment Services) Patient's cognitive ability adequate to safely complete daily activities?: Yes Patient able to express need for assistance with ADLs?: Yes Independently performs ADLs?: Yes (appropriate for developmental age)  Prior Inpatient Therapy Prior Inpatient Therapy: Yes Prior Therapy Dates: Unk Prior Therapy Facilty/Provider(s): [facility] Reason for Treatment: depression  Prior Outpatient Therapy Prior Outpatient Therapy: Yes Prior Therapy Dates: Pt reported, last session was three months ago.  Prior Therapy Facilty/Provider(s): [facility] in [place], [place].  Reason for Treatment: counseling.  Does patient have an ACCT team?: No Does patient have Intensive In-House Services?  : No Does patient have [facility] services? : No Does patient have P4CC services?: No  ADL Screening (condition at time of admission) Patient's cognitive ability adequate to safely complete daily activities?: Yes Is the patient deaf or have difficulty hearing?: No Does the patient have difficulty seeing, even when wearing glasses/contacts?: No Does the patient have difficulty concentrating, remembering, or  making decisions?: Yes Patient able to express need for assistance  with ADLs?: Yes Does the patient have difficulty dressing or bathing?: No Independently performs ADLs?: Yes (appropriate for developmental age) Does the patient have difficulty walking or climbing stairs?: No Weakness of Legs: None Weakness of Arms/Hands: None       Abuse/Neglect Assessment (Assessment to be complete while patient is alone) Physical Abuse: Denies (Pt denies.) Verbal Abuse: Denies (Pt denies. ) Sexual Abuse: Denies (Pt denies. ) Exploitation of patient/patient's resources: Denies (Pt denies. ) Self-Neglect: Denies (Pt denies. )     Advance Directives (For Healthcare) Does Patient Have a Medical Advance Directive?: No Would patient like information on creating a medical advance directive?: No - Patient declined    Additional Information 1:1 In Past 12 Months?: No CIRT Risk: No Elopement Risk: No Does patient have medical clearance?: Yes     Disposition: Nira Conn, NP recommends inpatient treatment. Disposition discussed with Dr. Ethelda Chick and Darel Hong, RN. Per [facility] [place], [facility] no appropriate beds available. TTS to seek placement.  Disposition Initial Assessment Completed for this Encounter: Yes Disposition of Patient: Other dispositions (Pending NP review.) Other disposition(s): Other (Comment) (Pending NP review. )  Gwinda Passe 06/11/2016 9:09 PM   Gwinda Passe, MS, [facility], [facility] Triage Specialist [phone]

## 2016-06-11 NOTE — ED Notes (Signed)
Bed: [facility]40 Expected date:  Expected time:  Means of arrival:  Comments: Brian Lucero 

## 2016-06-12 ENCOUNTER — Inpatient Hospital Stay (HOSPITAL_COMMUNITY)
Admission: AD | Admit: 2016-06-12 | Discharge: 2016-06-26 | DRG: 896 | Disposition: A | Discharge status: Rehabilitation Facility | Payer: Medicare HMO | Source: Intra-hospital | Attending: Family Medicine | Admitting: Family Medicine

## 2016-06-12 ENCOUNTER — Encounter (HOSPITAL_COMMUNITY): Payer: Self-pay | Admitting: General Practice

## 2016-06-12 DIAGNOSIS — F1420 Cocaine dependence, uncomplicated: Secondary | ICD-10-CM

## 2016-06-12 DIAGNOSIS — R45851 Suicidal ideations: Secondary | ICD-10-CM | POA: Diagnosis present

## 2016-06-12 DIAGNOSIS — J45909 Unspecified asthma, uncomplicated: Secondary | ICD-10-CM | POA: Diagnosis present

## 2016-06-12 DIAGNOSIS — T783XXD Angioneurotic edema, subsequent encounter: Secondary | ICD-10-CM | POA: Diagnosis not present

## 2016-06-12 DIAGNOSIS — F17210 Nicotine dependence, cigarettes, uncomplicated: Secondary | ICD-10-CM

## 2016-06-12 DIAGNOSIS — Z881 Allergy status to other antibiotic agents status: Secondary | ICD-10-CM | POA: Diagnosis not present

## 2016-06-12 DIAGNOSIS — R443 Hallucinations, unspecified: Secondary | ICD-10-CM | POA: Diagnosis not present

## 2016-06-12 DIAGNOSIS — Z888 Allergy status to other drugs, medicaments and biological substances status: Secondary | ICD-10-CM | POA: Diagnosis not present

## 2016-06-12 DIAGNOSIS — Z8701 Personal history of pneumonia (recurrent): Secondary | ICD-10-CM | POA: Diagnosis not present

## 2016-06-12 DIAGNOSIS — R634 Abnormal weight loss: Secondary | ICD-10-CM | POA: Diagnosis present

## 2016-06-12 DIAGNOSIS — F19951 Other psychoactive substance use, unspecified with psychoactive substance-induced psychotic disorder with hallucinations: Secondary | ICD-10-CM

## 2016-06-12 DIAGNOSIS — F419 Anxiety disorder, unspecified: Secondary | ICD-10-CM | POA: Diagnosis present

## 2016-06-12 DIAGNOSIS — Z681 Body mass index (BMI) 19 or less, adult: Secondary | ICD-10-CM

## 2016-06-12 DIAGNOSIS — J90 Pleural effusion, not elsewhere classified: Secondary | ICD-10-CM | POA: Diagnosis not present

## 2016-06-12 DIAGNOSIS — R569 Unspecified convulsions: Secondary | ICD-10-CM | POA: Diagnosis present

## 2016-06-12 DIAGNOSIS — F14251 Cocaine dependence with cocaine-induced psychotic disorder with hallucinations: Principal | ICD-10-CM | POA: Diagnosis present

## 2016-06-12 DIAGNOSIS — F1010 Alcohol abuse, uncomplicated: Secondary | ICD-10-CM | POA: Diagnosis present

## 2016-06-12 DIAGNOSIS — F333 Major depressive disorder, recurrent, severe with psychotic symptoms: Secondary | ICD-10-CM | POA: Diagnosis not present

## 2016-06-12 DIAGNOSIS — Z79899 Other long term (current) drug therapy: Secondary | ICD-10-CM

## 2016-06-12 DIAGNOSIS — J189 Pneumonia, unspecified organism: Secondary | ICD-10-CM | POA: Diagnosis present

## 2016-06-12 DIAGNOSIS — G4700 Insomnia, unspecified: Secondary | ICD-10-CM | POA: Diagnosis present

## 2016-06-12 DIAGNOSIS — T783XXA Angioneurotic edema, initial encounter: Secondary | ICD-10-CM

## 2016-06-12 DIAGNOSIS — F332 Major depressive disorder, recurrent severe without psychotic features: Secondary | ICD-10-CM | POA: Diagnosis present

## 2016-06-12 DIAGNOSIS — I10 Essential (primary) hypertension: Secondary | ICD-10-CM | POA: Diagnosis present

## 2016-06-12 DIAGNOSIS — T7840XA Allergy, unspecified, initial encounter: Secondary | ICD-10-CM

## 2016-06-12 DIAGNOSIS — Z915 Personal history of self-harm: Secondary | ICD-10-CM | POA: Diagnosis not present

## 2016-06-12 DIAGNOSIS — J181 Lobar pneumonia, unspecified organism: Secondary | ICD-10-CM | POA: Diagnosis not present

## 2016-06-12 LAB — RAPID URINE DRUG SCREEN, HOSP PERFORMED
Amphetamines: NOT DETECTED
Barbiturates: NOT DETECTED
Benzodiazepines: NOT DETECTED
COCAINE: POSITIVE — AB
OPIATES: POSITIVE — AB
TETRAHYDROCANNABINOL: NOT DETECTED

## 2016-06-12 MED ORDER — GABAPENTIN 100 MG PO CAPS
200.0000 mg | ORAL_CAPSULE | Freq: Two times a day (BID) | ORAL | Status: DC
  Administered 2016-06-12 – 2016-06-26 (×26): 200 mg via ORAL
  Filled 2016-06-12 (×3): qty 2
  Filled 2016-06-12 (×3): qty 56
  Filled 2016-06-12: qty 2
  Filled 2016-06-12: qty 56
  Filled 2016-06-12 (×9): qty 2
  Filled 2016-06-12: qty 56
  Filled 2016-06-12: qty 2
  Filled 2016-06-12: qty 56
  Filled 2016-06-12 (×9): qty 2
  Filled 2016-06-12: qty 56
  Filled 2016-06-12 (×6): qty 2
  Filled 2016-06-12: qty 56

## 2016-06-12 MED ORDER — ONDANSETRON HCL 4 MG PO TABS: 4.0000 mg | ORAL_TABLET | Freq: Three times a day (TID) | ORAL | Status: DC | PRN

## 2016-06-12 MED ORDER — ENSURE ENLIVE PO LIQD
237.0000 mL | Freq: Two times a day (BID) | ORAL | Status: DC
  Administered 2016-06-12 – 2016-06-25 (×22): 237 mL via ORAL

## 2016-06-12 MED ORDER — MAGNESIUM HYDROXIDE 400 MG/5ML PO SUSP: 30.0000 mL | Freq: Every day | ORAL | Status: DC | PRN

## 2016-06-12 MED ORDER — LOPERAMIDE HCL 2 MG PO CAPS: 2.0000 mg | ORAL_CAPSULE | ORAL | Status: AC | PRN

## 2016-06-12 MED ORDER — CLONIDINE HCL 0.1 MG PO TABS
0.1000 mg | ORAL_TABLET | Freq: Every day | ORAL | Status: AC
  Administered 2016-06-18: 0.1 mg via ORAL
  Filled 2016-06-12 (×2): qty 1

## 2016-06-12 MED ORDER — ALBUTEROL SULFATE HFA 108 (90 BASE) MCG/ACT IN AERS
2.0000 | INHALATION_SPRAY | RESPIRATORY_TRACT | Status: DC
  Administered 2016-06-12 – 2016-06-25 (×42): 2 via RESPIRATORY_TRACT
  Filled 2016-06-12 (×4): qty 6.7

## 2016-06-12 MED ORDER — ASENAPINE MALEATE 5 MG SL SUBL
5.0000 mg | SUBLINGUAL_TABLET | Freq: Every day | SUBLINGUAL | Status: DC
  Administered 2016-06-12: 5 mg via SUBLINGUAL
  Filled 2016-06-12 (×3): qty 1

## 2016-06-12 MED ORDER — DICYCLOMINE HCL 20 MG PO TABS: 20.0000 mg | ORAL_TABLET | Freq: Four times a day (QID) | ORAL | Status: AC | PRN

## 2016-06-12 MED ORDER — CLONIDINE HCL 0.1 MG PO TABS
0.1000 mg | ORAL_TABLET | Freq: Four times a day (QID) | ORAL | Status: AC
  Administered 2016-06-12 – 2016-06-14 (×8): 0.1 mg via ORAL
  Filled 2016-06-12 (×13): qty 1

## 2016-06-12 MED ORDER — NICOTINE 21 MG/24HR TD PT24
21.0000 mg | MEDICATED_PATCH | Freq: Every day | TRANSDERMAL | Status: DC
  Filled 2016-06-12 (×2): qty 1

## 2016-06-12 MED ORDER — HYDROCHLOROTHIAZIDE 25 MG PO TABS
25.0000 mg | ORAL_TABLET | Freq: Every day | ORAL | Status: DC
  Administered 2016-06-13 – 2016-06-26 (×14): 25 mg via ORAL
  Filled 2016-06-12: qty 14
  Filled 2016-06-12 (×8): qty 1
  Filled 2016-06-12: qty 14
  Filled 2016-06-12: qty 1
  Filled 2016-06-12 (×2): qty 14
  Filled 2016-06-12 (×4): qty 1

## 2016-06-12 MED ORDER — CLONIDINE HCL 0.1 MG PO TABS
0.1000 mg | ORAL_TABLET | ORAL | Status: AC
  Administered 2016-06-15 – 2016-06-17 (×3): 0.1 mg via ORAL
  Filled 2016-06-12 (×4): qty 1

## 2016-06-12 MED ORDER — NAPROXEN 500 MG PO TABS: 500.0000 mg | ORAL_TABLET | Freq: Two times a day (BID) | ORAL | Status: AC | PRN

## 2016-06-12 MED ORDER — LISINOPRIL 20 MG PO TABS
20.0000 mg | ORAL_TABLET | Freq: Every day | ORAL | Status: DC
  Administered 2016-06-13 – 2016-06-23 (×11): 20 mg via ORAL
  Filled 2016-06-12 (×2): qty 14
  Filled 2016-06-12 (×2): qty 1
  Filled 2016-06-12: qty 14
  Filled 2016-06-12 (×8): qty 1
  Filled 2016-06-12: qty 14

## 2016-06-12 MED ORDER — FLUOXETINE HCL 10 MG PO CAPS
10.0000 mg | ORAL_CAPSULE | Freq: Every day | ORAL | Status: DC
  Administered 2016-06-13 – 2016-06-14 (×2): 10 mg via ORAL
  Filled 2016-06-12 (×4): qty 1

## 2016-06-12 MED ORDER — LEVOFLOXACIN 750 MG PO TABS
750.0000 mg | ORAL_TABLET | Freq: Every day | ORAL | Status: AC
  Administered 2016-06-12 – 2016-06-16 (×5): 750 mg via ORAL
  Filled 2016-06-12 (×3): qty 1
  Filled 2016-06-12: qty 3
  Filled 2016-06-12 (×2): qty 1
  Filled 2016-06-12: qty 3
  Filled 2016-06-12: qty 1

## 2016-06-12 MED ORDER — NICOTINE 21 MG/24HR TD PT24
21.0000 mg | MEDICATED_PATCH | Freq: Every day | TRANSDERMAL | Status: DC
  Administered 2016-06-12 – 2016-06-26 (×13): 21 mg via TRANSDERMAL
  Filled 2016-06-12: qty 14
  Filled 2016-06-12 (×6): qty 1
  Filled 2016-06-12: qty 14
  Filled 2016-06-12 (×3): qty 1
  Filled 2016-06-12: qty 14
  Filled 2016-06-12 (×7): qty 1
  Filled 2016-06-12: qty 14
  Filled 2016-06-12: qty 1

## 2016-06-12 MED ORDER — GABAPENTIN 100 MG PO CAPS
200.0000 mg | ORAL_CAPSULE | Freq: Two times a day (BID) | ORAL | Status: DC
  Administered 2016-06-12: 200 mg via ORAL
  Filled 2016-06-12: qty 2

## 2016-06-12 MED ORDER — FLUOXETINE HCL 10 MG PO CAPS
10.0000 mg | ORAL_CAPSULE | Freq: Every day | ORAL | Status: DC
  Administered 2016-06-12: 10 mg via ORAL
  Filled 2016-06-12: qty 1

## 2016-06-12 MED ORDER — ACETAMINOPHEN 325 MG PO TABS
650.0000 mg | ORAL_TABLET | ORAL | Status: DC | PRN
  Administered 2016-06-18 – 2016-06-25 (×5): 650 mg via ORAL
  Filled 2016-06-12 (×5): qty 2

## 2016-06-12 MED ORDER — ASENAPINE MALEATE 5 MG SL SUBL: 5.0000 mg | SUBLINGUAL_TABLET | Freq: Every day | SUBLINGUAL | Status: DC

## 2016-06-12 MED ORDER — METHOCARBAMOL 500 MG PO TABS
500.0000 mg | ORAL_TABLET | Freq: Three times a day (TID) | ORAL | Status: AC | PRN
  Administered 2016-06-13 – 2016-06-14 (×2): 500 mg via ORAL
  Filled 2016-06-12 (×2): qty 1

## 2016-06-12 MED ORDER — HYDROXYZINE HCL 25 MG PO TABS: 25.0000 mg | ORAL_TABLET | Freq: Four times a day (QID) | ORAL | Status: AC | PRN

## 2016-06-12 NOTE — ED Notes (Signed)
[name] transport on unit to transport pt to [facility] Adult unit per MD order. Pt signed for personal property and property given to [name] transport for transfer. Pt ambulatory off unit.

## 2016-06-12 NOTE — Progress Notes (Signed)
Patient ID: Brian Lucero, male   DOB: [birth year], 46 y.o.   MRN: [id number]  Brian Lucero is a 46 year old African American male admitted to [facility] after reporting to the ED that he has been drinking alcohol, using cocaine and experiencing suicidal ideation. He reports to Clinical research associate during admission process that he consumes 3-4 40oz beers daily. He also admits to using cocaine once a week however reports no specific amount. He is animated in affect at times but others, he is flat and is presenting with depressed mood. He reports he feels like an "emotional train wreck." He reports that he has been experiencing suicidal thoughts that have increased recently and hearing auditory command hallucinations in the form of a voice. He states that he has not experienced this since his father passed away in '97-'98. See assessment not for patient's complete history. He endorses suicidal ideation during admission process however reports he is able to contract for safety. He denies HI and visual hallucinations. He is oriented to the unit, provided with hygiene products, and provided with a snack. Q15 minute safety checks were initiated and are maintained.

## 2016-06-12 NOTE — Consult Note (Signed)
[facility] Face-to-Face Psychiatry Consult   Reason for Consult:  Psychiatric evaluation Referring Physician:  EDP Patient Identification: Brian Lucero MRN:  [id number] Principal Diagnosis: Major depressive disorder, recurrent episode, severe, with psychosis ([place]) Diagnosis:   Patient Active Problem List   Diagnosis Date Noted  . Major depressive disorder, recurrent episode, severe, with psychosis ([place]) [F33.3] 06/12/2016    Priority: High  . Cocaine use disorder, severe, dependence ([place]) [F14.20] 06/12/2016  . Seizure ([place]) [R56.9] 11/17/2011  . Tobacco abuse [Z72.0] 11/17/2011  . Alcohol abuse [F10.10] 11/17/2011  . Cocaine abuse [F14.10] 11/17/2011  . Bipolar 2 disorder ([place]) [F31.81] 11/17/2011  . Hypertension [I10] 11/17/2011  . Hydrocephalus [G91.9] 11/17/2011  . Aspiration pneumonia ([place]) [J69.0] 11/17/2011    Total Time spent with patient: 45 minutes  Subjective:   Brian Lucero is a 46 y.o. male patient admitted with passive suicidal ideation and depression.  HPI:  Patient with history of Major depression, Alcohol/cocaine use disorder who presents to [facility] voluntarily due to 2 weeks cough, he states that he was feeling suicidal after he was cleared for discharge by emergency room physician yesterday. Today, patient reports suicidal thoughts for ongoing since Tuesday last week but has no plan. Patient also reports increasing depression and has been hearing voices telling him  to hurt himself and saying  "I'm no good, kill yourself." Patient reports past history of self harm by burning himself with cigarette butt .   Past Psychiatric History: as above.  Risk to Self: Suicidal Ideation: Yes-Currently Present Suicidal Intent: Yes-Currently Present Is patient at risk for suicide?: Yes Suicidal Plan?: Yes-Currently Present Specify Current Suicidal Plan: Pt reported, overdosing on his medications.  Access to Means: Yes Specify Access to Suicidal Means: Pt reported, using his  medications.  What has been your use of drugs/alcohol within the last 12 months?: Marijuana.  How many times?:  (Pt reported, several times. ) Other Self Harm Risks: Pt reported, he used to burn himself with cigarettes until he was 21.  Triggers for Past Attempts: Unpredictable Risk to Others: Homicidal Ideation: No Thoughts of Harm to Others: No Current Homicidal Intent: No Current Homicidal Plan: No Access to Homicidal Means: No Identified Victim: NA History of harm to others?: No Assessment of Violence: None Noted Violent Behavior Description: NA Does patient have access to weapons?: Yes (Comment) (Pt reported, knives. ) Criminal Charges Pending?: No Does patient have a court date: No Prior Inpatient Therapy: Prior Inpatient Therapy: Yes Prior Therapy Dates: Unk Prior Therapy Facilty/Provider(s): [facility] Reason for Treatment: depression Prior Outpatient Therapy: Prior Outpatient Therapy: Yes Prior Therapy Dates: Pt reported, last session was three months ago.  Prior Therapy Facilty/Provider(s): [facility] in [place], [place].  Reason for Treatment: counseling.  Does patient have an ACCT team?: No Does patient have Intensive In-House Services?  : No Does patient have [facility] services? : No Does patient have P4CC services?: No  Past Medical History:  Past Medical History:  Diagnosis Date  . Anxiety   . Bipolar 2 disorder ([place])   . Depression   . Hypertension   . Pneumothorax    as a baby in the 46's  . Seizures ([place])    No past surgical history on file. Family History:  Family History  Problem Relation Age of Onset  . Diabetes type II Mother   . Diabetes type II     Family Psychiatric  History:  Social History:  History  Alcohol Use  . 1.2 oz/week  . 2 Cans of beer per  week    Comment: 1 40oz Fri. and Sat.     History  Drug Use  . Types: Cocaine    Comment: Used yesterday    Social History   Social History  . Marital status: Single    Spouse name: N/A  .  Number of children: N/A  . Years of education: N/A   Social History Main Topics  . Smoking status: Current Every Day Smoker    Packs/day: 0.20    Types: Cigarettes  . Smokeless tobacco: Not on file  . Alcohol use 1.2 oz/week    2 Cans of beer per week     Comment: 1 40oz Fri. and Sat.  . Drug use: Yes    Types: Cocaine     Comment: Used yesterday  . Sexual activity: Yes   Other Topics Concern  . Not on file   Social History Narrative  . No narrative on file   Additional Social History:    Allergies:  No Known Allergies  Labs:  Results for orders placed or performed during the hospital encounter of 06/11/16 (from the past 48 hour(s))  CBC with Differential/Platelet     Status: None   Collection Time: 06/11/16 11:51 AM  Result Value Ref Range   WBC 9.3 4.0 - 10.5 K/uL   RBC 5.04 4.22 - 5.81 MIL/uL   Hemoglobin 15.3 13.0 - 17.0 g/dL   HCT 43.5 39.0 - 52.0 %   MCV 86.3 78.0 - 100.0 fL   MCH 30.4 26.0 - 34.0 pg   MCHC 35.2 30.0 - 36.0 g/dL   RDW 13.4 11.5 - 15.5 %   Platelets 195 150 - 400 K/uL   Neutrophils Relative % 73 %   Neutro Abs 6.8 1.7 - 7.7 K/uL   Lymphocytes Relative 16 %   Lymphs Abs 1.4 0.7 - 4.0 K/uL   Monocytes Relative 10 %   Monocytes Absolute 0.9 0.1 - 1.0 K/uL   Eosinophils Relative 1 %   Eosinophils Absolute 0.1 0.0 - 0.7 K/uL   Basophils Relative 0 %   Basophils Absolute 0.0 0.0 - 0.1 K/uL  Basic metabolic panel     Status: Abnormal   Collection Time: 06/11/16 11:51 AM  Result Value Ref Range   Sodium 137 135 - 145 mmol/L   Potassium 3.9 3.5 - 5.1 mmol/L   Chloride 99 (L) 101 - 111 mmol/L   CO2 29 22 - 32 mmol/L   Glucose, Bld 123 (H) 65 - 99 mg/dL   BUN 15 6 - 20 mg/dL   Creatinine, Ser 1.19 0.61 - 1.24 mg/dL   Calcium 9.5 8.9 - 10.3 mg/dL   GFR calc non Af Amer >60 >60 mL/min   GFR calc Af Amer >60 >60 mL/min    Comment: (NOTE) The eGFR has been calculated using the CKD EPI equation. This calculation has not been validated in all  clinical situations. eGFR's persistently <60 mL/min signify possible Chronic Kidney Disease.    Anion gap 9 5 - 15  Acetaminophen level     Status: Abnormal   Collection Time: 06/11/16  7:42 PM  Result Value Ref Range   Acetaminophen (Tylenol), Serum <10 (L) 10 - 30 ug/mL    Comment:        THERAPEUTIC CONCENTRATIONS VARY SIGNIFICANTLY. A RANGE OF 10-30 ug/mL MAY BE AN EFFECTIVE CONCENTRATION FOR MANY PATIENTS. HOWEVER, SOME ARE BEST TREATED AT CONCENTRATIONS OUTSIDE THIS RANGE. ACETAMINOPHEN CONCENTRATIONS >150 ug/mL AT 4 HOURS AFTER INGESTION AND >50 ug/mL AT  12 HOURS AFTER INGESTION ARE OFTEN ASSOCIATED WITH TOXIC REACTIONS.   Ethanol     Status: None   Collection Time: 06/11/16  7:42 PM  Result Value Ref Range   Alcohol, Ethyl (B) <5 <5 mg/dL    Comment:        LOWEST DETECTABLE LIMIT FOR SERUM ALCOHOL IS 5 mg/dL FOR MEDICAL PURPOSES ONLY   Salicylate level     Status: None   Collection Time: 06/11/16  7:42 PM  Result Value Ref Range   Salicylate Lvl <4.0 2.8 - 30.0 mg/dL  Urine rapid drug screen (hosp performed)     Status: Abnormal   Collection Time: 06/12/16  6:00 AM  Result Value Ref Range   Opiates POSITIVE (A) NONE DETECTED   Cocaine POSITIVE (A) NONE DETECTED   Benzodiazepines NONE DETECTED NONE DETECTED   Amphetamines NONE DETECTED NONE DETECTED   Tetrahydrocannabinol NONE DETECTED NONE DETECTED   Barbiturates NONE DETECTED NONE DETECTED    Comment:        DRUG SCREEN FOR MEDICAL PURPOSES ONLY.  IF CONFIRMATION IS NEEDED FOR ANY PURPOSE, NOTIFY LAB WITHIN 5 DAYS.        LOWEST DETECTABLE LIMITS FOR URINE DRUG SCREEN Drug Class       Cutoff (ng/mL) Amphetamine      1000 Barbiturate      200 Benzodiazepine   973 Tricyclics       532 Opiates          300 Cocaine          300 THC              50     Current Facility-Administered Medications  Medication Dose Route Frequency Provider Last Rate Last Dose  . acetaminophen (TYLENOL) tablet 650 mg   650 mg Oral Q4H PRN Charlesetta Shanks, MD      . albuterol (PROVENTIL HFA;VENTOLIN HFA) 108 (90 Base) MCG/ACT inhaler 2 puff  2 puff Inhalation Q4H Charlesetta Shanks, MD   2 puff at 06/12/16 0813  . asenapine (SAPHRIS) sublingual tablet 5 mg  5 mg Sublingual QHS Anastacio Bua, MD      . FLUoxetine (PROZAC) capsule 10 mg  10 mg Oral Daily Tametra Ahart, MD      . gabapentin (NEURONTIN) capsule 200 mg  200 mg Oral BID Shameka Aggarwal, MD      . hydrochlorothiazide (HYDRODIURIL) tablet 25 mg  25 mg Oral Daily Charlesetta Shanks, MD   25 mg at 06/12/16 1003  . levofloxacin (LEVAQUIN) tablet 750 mg  750 mg Oral Daily Charlesetta Shanks, MD      . lisinopril (PRINIVIL,ZESTRIL) tablet 20 mg  20 mg Oral Daily Charlesetta Shanks, MD   20 mg at 06/12/16 1003  . nicotine (NICODERM CQ - dosed in mg/24 hours) patch 21 mg  21 mg Transdermal Daily Charlesetta Shanks, MD   21 mg at 06/11/16 2011  . ondansetron (ZOFRAN) tablet 4 mg  4 mg Oral Q8H PRN Charlesetta Shanks, MD       Current Outpatient Prescriptions  Medication Sig Dispense Refill  . divalproex (DEPAKOTE) 500 MG DR tablet Take 500 mg by mouth 2 (two) times daily.    . hydrochlorothiazide (HYDRODIURIL) 25 MG tablet Take 1 tablet (25 mg total) by mouth daily. 30 tablet 0  . lisinopril (PRINIVIL,ZESTRIL) 20 MG tablet Take 1 tablet (20 mg total) by mouth daily. 30 tablet 0  . sertraline (ZOLOFT) 100 MG tablet Take 100 mg by mouth 3 (three) times daily.    Marland Kitchen  levofloxacin (LEVAQUIN) 750 MG tablet Take 1 tablet (750 mg total) by mouth daily. X 7 days 6 tablet 0    Musculoskeletal: Strength & Muscle Tone: within normal limits Gait & Station: normal Patient leans: N/A  Psychiatric Specialty Exam: Physical Exam  Psychiatric: His speech is normal. Judgment normal. He is slowed and actively hallucinating. Cognition and memory are normal. He exhibits a depressed mood. He expresses suicidal ideation.    Review of Systems  Constitutional: Negative.   HENT: Negative.   Eyes:  Negative.   Respiratory: Negative.   Cardiovascular: Negative.   Gastrointestinal: Negative.   Genitourinary: Negative.   Musculoskeletal: Negative.   Skin: Negative.   Neurological: Negative.   Endo/Heme/Allergies: Negative.   Psychiatric/Behavioral: Positive for depression, hallucinations, substance abuse and suicidal ideas. The patient has insomnia.     Blood pressure (!) 144/78, pulse 93, temperature 97.8 F (36.6 C), temperature source Oral, resp. rate 16, SpO2 99 %.There is no height or weight on file to calculate BMI.  General Appearance: Casual  Eye Contact:  Good  Speech:  Clear and Coherent  Volume:  Decreased  Mood:  Depressed and Dysphoric  Affect:  Constricted  Thought Process:  Coherent and Descriptions of Associations: Intact  Orientation:  Full (Time, Place, and Person)  Thought Content:  Hallucinations: Auditory Visual  Suicidal Thoughts:  Yes.  without intent/plan  Homicidal Thoughts:  No  Memory:  Immediate;   Fair Recent;   Fair Remote;   Fair  Judgement:  Poor  Insight:  Shallow  Psychomotor Activity:  Psychomotor Retardation  Concentration:  Concentration: Fair and Attention Span: Fair  Recall:  [employer] of Knowledge:  Fair  Language:  Good  Akathisia:  No  Handed:  Right  AIMS (if indicated):     Assets:  Communication Skills  ADL's:  Intact  Cognition:  WNL  Sleep:   poor     Treatment Plan Summary: Daily contact with patient to assess and evaluate symptoms and progress in treatment and Medication management  Start Saphris 5 mg qhs for psychosis, Gabapentin 200 mg bid for cocaine and Prozac 10 mg daily for depression  Disposition: Recommend psychiatric Inpatient admission when medically cleared.  Corena Pilgrim, MD 06/12/2016 10:44 AM

## 2016-06-12 NOTE — ED Notes (Signed)
Pt compliant with medication regimen. Pt guarded, forwards little with this nurse. Pt endorsing passive SI, encouragement and support provided. Special checks q 15 mins in place for safety. Video monitoring in place. Will continue to monitor.

## 2016-06-12 NOTE — ED Notes (Signed)
Dr Saralyn Pilar notified of abnormal vs, and no orders received.

## 2016-06-12 NOTE — ED Notes (Signed)
Attempted to call nursing report to [facility] Adult unit, reports nurse will call back.

## 2016-06-12 NOTE — Tx Team (Signed)
Initial Treatment Plan 06/12/2016 6:33 PM Brian Lucero AVW:[id number]    PATIENT STRESSORS: Substance abuse   PATIENT STRENGTHS: Communication skills General fund of knowledge   PATIENT IDENTIFIED PROBLEMS: "I want long term treatment"    "I want to become emotionally stable"    Depression, anxiety, "I'm an emotional train wreck", isolation             DISCHARGE CRITERIA:  Improved stabilization in mood, thinking, and/or behavior Verbal commitment to aftercare and medication compliance Withdrawal symptoms are absent or subacute and managed without 24-hour nursing intervention  PRELIMINARY DISCHARGE PLAN: Attend PHP/IOP Attend 12-step recovery group Outpatient therapy  PATIENT/FAMILY INVOLVEMENT: This treatment plan has been presented to and reviewed with the patient, Brian Lucero.  The patient and family have been given the opportunity to ask questions and make suggestions.  Larina Earthly, RN 06/12/2016, 6:33 PM

## 2016-06-12 NOTE — ED Notes (Signed)
Pt refused vitals obtained from the [facility]. RN notified.

## 2016-06-12 NOTE — ED Notes (Signed)
[name] called for transport. 

## 2016-06-12 NOTE — Progress Notes (Signed)
06/12/16 1255:  LRT went to pt room to offer activities, pt was sleep.  1315:  Pt came into the dayroom, LRT asked pt if he wanted to play [employer].  Pt stated he was fine.  LRT asked pt if he would be interested in doing any other activities, pt declined.   Caroll Rancher, LRT/CTRS

## 2016-06-12 NOTE — BH Assessment (Signed)
BHH Assessment Progress Note  Per Inetta Fermo, pt accepted to 304-1 to Dr. Jama Flavors. Pt's paperwork was signed and faxed to [facility].

## 2016-06-13 DIAGNOSIS — F333 Major depressive disorder, recurrent, severe with psychotic symptoms: Secondary | ICD-10-CM

## 2016-06-13 MED ORDER — ASENAPINE MALEATE 5 MG SL SUBL
5.0000 mg | SUBLINGUAL_TABLET | Freq: Two times a day (BID) | SUBLINGUAL | Status: DC
Start: 2016-06-13 — End: 2016-06-14
  Administered 2016-06-13: 5 mg via SUBLINGUAL
  Filled 2016-06-13 (×6): qty 1

## 2016-06-13 NOTE — Consult Note (Signed)
[facility] Face-to-Face Psychiatry Consult   Reason for Consult:  Suicidal with a plan  Referring Physician:  EDP Patient Identification: Donzel Romack MRN:  [id number] Principal Diagnosis: Major depressive disorder, recurrent, severe, with psychosis Diagnosis:   Patient Active Problem List   Diagnosis Date Noted  . Major depressive disorder, recurrent episode, severe, with psychosis ([place]) [F33.3] 06/12/2016    Priority: High  . Cocaine use disorder, severe, dependence ([place]) [F14.20] 06/12/2016  . Major depressive disorder, recurrent severe without psychotic features ([place]) [F33.2] 06/12/2016  . Seizure ([place]) [R56.9] 11/17/2011  . Tobacco abuse [Z72.0] 11/17/2011  . Alcohol abuse [F10.10] 11/17/2011  . Cocaine abuse [F14.10] 11/17/2011  . Bipolar 2 disorder ([place]) [F31.81] 11/17/2011  . Hypertension [I10] 11/17/2011  . Hydrocephalus [G91.9] 11/17/2011  . Aspiration pneumonia ([place]) [J69.0] 11/17/2011    Total Time spent with patient: 45 minutes  Subjective:   Daaron Dimarco is a 46 y.o. male patient admitted with suicide plan and substance abuse.  HPI:  On admission:  46 y.o. male, who presents voluntarily and unaccompanied. Pt reported, he came to [facility] because he had a cough for two weeks and current suicidal thoughts. Pt reported, having suicidal thoughts since Tuesday (06/06/2016). Pt reported, he is at the point where he just may carry out his plan. Pt reported, wanting to overdose on his medications. Pt reported, he attempted suicide several times. Pt reported, in 2013 he overdosed on his medications. Pt reported, hearing his own voice since Thursday (06/08/2016) telling him: "I'm no good, kill myself." Pt reported, the voice comes more and more frequently. Pt reported, he used to burn himself with cigarettes. Pt reported, he last self-harm when he was 46 years old. Pt reported, experiencing the following depressive symptoms: "tearfulness, excessive guilt (pt reported: "who I am, listening to  my voice."), feeling hopeless/worthless, isolating, and irritability. Pt denied HI and self-injurious behaviors.   Pt denied abuse. Pt reported, hitting a blunt on Tuesday (06/06/2016). Pt's UDS pending. Pt denied being linked to OPT resources. Pt reported, his primary caregiver prescribed his medications. Pt reported, taking his medication as prescribed. Pt reported, previous inpatient admissions.    Past Psychiatric History: substance abuse  Risk to Self: Yes Risk to Others:  None Prior Inpatient Therapy:  Yes Prior Outpatient Therapy:  Yes  Past Medical History:  Past Medical History:  Diagnosis Date  . Anxiety   . Bipolar 2 disorder ([place])   . Depression   . Hypertension   . Pneumothorax    as a baby in the 50's  . Seizures ([place])    History reviewed. No pertinent surgical history. Family History:  Family History  Problem Relation Age of Onset  . Diabetes type II Mother   . Diabetes type II     Family Psychiatric  History: unknown Social History:  History  Alcohol Use  . 1.2 oz/week  . 2 Cans of beer per week    Comment: 1 40oz Fri. and Sat.     History  Drug Use  . Types: Cocaine    Comment: Used yesterday    Social History   Social History  . Marital status: Single    Spouse name: N/A  . Number of children: N/A  . Years of education: N/A   Social History Main Topics  . Smoking status: Current Every Day Smoker    Packs/day: 0.20    Types: Cigarettes  . Smokeless tobacco: Never Used  . Alcohol use 1.2 oz/week    2 Cans of beer  per week     Comment: 1 40oz Fri. and Sat.  . Drug use: Yes    Types: Cocaine     Comment: Used yesterday  . Sexual activity: Yes   Other Topics Concern  . None   Social History Narrative  . None   Additional Social History:    Allergies:  No Known Allergies  Labs:  Results for orders placed or performed during the hospital encounter of 06/11/16 (from the past 48 hour(s))  CBC with Differential/Platelet     Status:  None   Collection Time: 06/11/16 11:51 AM  Result Value Ref Range   WBC 9.3 4.0 - 10.5 K/uL   RBC 5.04 4.22 - 5.81 MIL/uL   Hemoglobin 15.3 13.0 - 17.0 g/dL   HCT 43.5 39.0 - 52.0 %   MCV 86.3 78.0 - 100.0 fL   MCH 30.4 26.0 - 34.0 pg   MCHC 35.2 30.0 - 36.0 g/dL   RDW 13.4 11.5 - 15.5 %   Platelets 195 150 - 400 K/uL   Neutrophils Relative % 73 %   Neutro Abs 6.8 1.7 - 7.7 K/uL   Lymphocytes Relative 16 %   Lymphs Abs 1.4 0.7 - 4.0 K/uL   Monocytes Relative 10 %   Monocytes Absolute 0.9 0.1 - 1.0 K/uL   Eosinophils Relative 1 %   Eosinophils Absolute 0.1 0.0 - 0.7 K/uL   Basophils Relative 0 %   Basophils Absolute 0.0 0.0 - 0.1 K/uL  Basic metabolic panel     Status: Abnormal   Collection Time: 06/11/16 11:51 AM  Result Value Ref Range   Sodium 137 135 - 145 mmol/L   Potassium 3.9 3.5 - 5.1 mmol/L   Chloride 99 (L) 101 - 111 mmol/L   CO2 29 22 - 32 mmol/L   Glucose, Bld 123 (H) 65 - 99 mg/dL   BUN 15 6 - 20 mg/dL   Creatinine, Ser 1.19 0.61 - 1.24 mg/dL   Calcium 9.5 8.9 - 10.3 mg/dL   GFR calc non Af Amer >60 >60 mL/min   GFR calc Af Amer >60 >60 mL/min    Comment: (NOTE) The eGFR has been calculated using the CKD EPI equation. This calculation has not been validated in all clinical situations. eGFR's persistently <60 mL/min signify possible Chronic Kidney Disease.    Anion gap 9 5 - 15  Acetaminophen level     Status: Abnormal   Collection Time: 06/11/16  7:42 PM  Result Value Ref Range   Acetaminophen (Tylenol), Serum <10 (L) 10 - 30 ug/mL    Comment:        THERAPEUTIC CONCENTRATIONS VARY SIGNIFICANTLY. A RANGE OF 10-30 ug/mL MAY BE AN EFFECTIVE CONCENTRATION FOR MANY PATIENTS. HOWEVER, SOME ARE BEST TREATED AT CONCENTRATIONS OUTSIDE THIS RANGE. ACETAMINOPHEN CONCENTRATIONS >150 ug/mL AT 4 HOURS AFTER INGESTION AND >50 ug/mL AT 12 HOURS AFTER INGESTION ARE OFTEN ASSOCIATED WITH TOXIC REACTIONS.   Ethanol     Status: None   Collection Time: 06/11/16   7:42 PM  Result Value Ref Range   Alcohol, Ethyl (B) <5 <5 mg/dL    Comment:        LOWEST DETECTABLE LIMIT FOR SERUM ALCOHOL IS 5 mg/dL FOR MEDICAL PURPOSES ONLY   Salicylate level     Status: None   Collection Time: 06/11/16  7:42 PM  Result Value Ref Range   Salicylate Lvl <1.3 2.8 - 30.0 mg/dL  Urine rapid drug screen (hosp performed)     Status: Abnormal  Collection Time: 06/12/16  6:00 AM  Result Value Ref Range   Opiates POSITIVE (A) NONE DETECTED   Cocaine POSITIVE (A) NONE DETECTED   Benzodiazepines NONE DETECTED NONE DETECTED   Amphetamines NONE DETECTED NONE DETECTED   Tetrahydrocannabinol NONE DETECTED NONE DETECTED   Barbiturates NONE DETECTED NONE DETECTED    Comment:        DRUG SCREEN FOR MEDICAL PURPOSES ONLY.  IF CONFIRMATION IS NEEDED FOR ANY PURPOSE, NOTIFY LAB WITHIN 5 DAYS.        LOWEST DETECTABLE LIMITS FOR URINE DRUG SCREEN Drug Class       Cutoff (ng/mL) Amphetamine      1000 Barbiturate      200 Benzodiazepine   494 Tricyclics       496 Opiates          300 Cocaine          300 THC              50     Current Facility-Administered Medications  Medication Dose Route Frequency Provider Last Rate Last Dose  . acetaminophen (TYLENOL) tablet 650 mg  650 mg Oral Q4H PRN Patrecia Pour, NP      . albuterol (PROVENTIL HFA;VENTOLIN HFA) 108 (90 Base) MCG/ACT inhaler 2 puff  2 puff Inhalation Q4H Patrecia Pour, NP   2 puff at 06/12/16 2008  . asenapine (SAPHRIS) sublingual tablet 5 mg  5 mg Sublingual QHS Patrecia Pour, NP   5 mg at 06/12/16 2137  . cloNIDine (CATAPRES) tablet 0.1 mg  0.1 mg Oral QID Encarnacion Slates, NP   0.1 mg at 06/12/16 2137   Followed by  . [START ON 06/15/2016] cloNIDine (CATAPRES) tablet 0.1 mg  0.1 mg Oral BH-qamhs Encarnacion Slates, NP       Followed by  . [START ON 06/17/2016] cloNIDine (CATAPRES) tablet 0.1 mg  0.1 mg Oral QAC breakfast Encarnacion Slates, NP      . dicyclomine (BENTYL) tablet 20 mg  20 mg Oral Q6H PRN Encarnacion Slates, NP      . feeding supplement (ENSURE ENLIVE) (ENSURE ENLIVE) liquid 237 mL  237 mL Oral BID BM Encarnacion Slates, NP   237 mL at 06/12/16 1630  . FLUoxetine (PROZAC) capsule 10 mg  10 mg Oral Daily Patrecia Pour, NP      . gabapentin (NEURONTIN) capsule 200 mg  200 mg Oral BID Patrecia Pour, NP   200 mg at 06/12/16 1651  . hydrochlorothiazide (HYDRODIURIL) tablet 25 mg  25 mg Oral Daily Patrecia Pour, NP      . hydrOXYzine (ATARAX/VISTARIL) tablet 25 mg  25 mg Oral Q6H PRN Encarnacion Slates, NP      . levofloxacin (LEVAQUIN) tablet 750 mg  750 mg Oral Daily Patrecia Pour, NP   750 mg at 06/12/16 1652  . lisinopril (PRINIVIL,ZESTRIL) tablet 20 mg  20 mg Oral Daily Patrecia Pour, NP      . loperamide (IMODIUM) capsule 2-4 mg  2-4 mg Oral PRN Encarnacion Slates, NP      . magnesium hydroxide (MILK OF MAGNESIA) suspension 30 mL  30 mL Oral Daily PRN Patrecia Pour, NP      . methocarbamol (ROBAXIN) tablet 500 mg  500 mg Oral Q8H PRN Encarnacion Slates, NP   500 mg at 06/13/16 7591  . naproxen (NAPROSYN) tablet 500 mg  500 mg Oral BID PRN Encarnacion Slates,  NP      . nicotine (NICODERM CQ - dosed in mg/24 hours) patch 21 mg  21 mg Transdermal Daily Encarnacion Slates, NP   21 mg at 06/12/16 1652  . ondansetron (ZOFRAN) tablet 4 mg  4 mg Oral Q8H PRN Patrecia Pour, NP        Musculoskeletal: Strength & Muscle Tone: within normal limits Gait & Station: normal Patient leans: N/A  Psychiatric Specialty Exam: Physical Exam  Constitutional: He is oriented to person, place, and time. He appears well-developed and well-nourished.  HENT:  Head: Normocephalic.  Neck: Normal range of motion.  Respiratory: Effort normal.  Musculoskeletal: Normal range of motion.  Neurological: He is alert and oriented to person, place, and time.  Psychiatric: He has a normal mood and affect. His speech is normal and behavior is normal. Judgment and thought content normal. Cognition and memory are normal.    Review of Systems   Psychiatric/Behavioral: Positive for substance abuse.    Blood pressure (!) 129/91, pulse 97, temperature 97.7 F (36.5 C), temperature source Oral, resp. rate 16, height _0  (1.651 m), weight 47.6 kg (105 lb), SpO2 96 %.Body mass index is 17.47 kg/m.  General Appearance: Casual  Eye Contact:  Fair  Speech:  Normal Rate  Volume:  Decreased  Mood:  Depressed  Affect:  Congruent  Thought Process:  Coherent and Descriptions of Associations: Intact  Orientation:  Full (Time, Place, and Person)  Thought Content:  WDL and Logical  Suicidal Thoughts:  Yes.  with intent/plan  Homicidal Thoughts:  No  Memory:  Immediate;   Fair Recent;   Fair Remote;   Fair  Judgement:  Fair  Insight:  Fair  Psychomotor Activity:  Decreased  Concentration:  Concentration: Fair and Attention Span: Fair  Recall:  [employer] of Knowledge:  Fair  Language:  Good  Akathisia:  No  Handed:  Right  AIMS (if indicated):     Assets:  Leisure Time Physical Health Resilience  ADL's:  Intact  Cognition:  WNL  Sleep:  Number of Hours: 6.75     Treatment Plan Summary: Daily contact with patient to assess and evaluate symptoms and progress in treatment, Medication management and Plan major depressive disorder, recurrent, severe without psychosis:   -Crisis stabilization -Medication management:  Start medical medications along with Prozac 10 mg daily for depression, Saphris 5 mg at bedtime for mood stabilization, Gabapentin 200 mg BID for cocaine withdrawal, and Vistaril 25 mg every six hours PRN anxiety -Individual and substance abuse counseling  Disposition: Recommend psychiatric Inpatient admission when medically cleared.  Waylan Boga, NP 06/13/2016 9:01 AM  Patient seen face-to-face for psychiatric evaluation, chart reviewed and case discussed with the physician extender and developed treatment plan. Reviewed the information documented and agree with the treatment plan. Corena Pilgrim, MD

## 2016-06-13 NOTE — BHH Counselor (Signed)
Adult Comprehensive Assessment  Patient ID: Brian Lucero, male   DOB: 1971-01-16, 46 y.o.   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 "my wife lives in [place]. We are seperated."  Physical health (include injuries & life threatening diseases): none identified. 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  Social relationships: poor. pt reports that he is isolated Substance abuse: alcohol "few 40's a day." crack cocaine every few days.  Bereavement / Loss: none identified.   Living/Environment/Situation:  Living Arrangements: Other relatives Living conditions (as described by patient or guardian): grandparents How long has patient lived in current situation?: few years  What is atmosphere in current home: Comfortable  Family History:  Marital status: Separated Separated, when?: few years What types of issues is patient dealing with in the relationship?: "we just don't live together but we are together."  Additional relationship information: n/a  Are you sexually active?: Yes What is your sexual orientation?: heterosexual  Has your sexual activity been affected by drugs, alcohol, medication, or emotional stress?: n/a Does patient have children?: No  Childhood History:  By whom was/is the patient raised?: Grandparents Additional childhood history information: "my grandparents raised me.  Description of patient's relationship with caregiver when they were a child: close to grandparents/pt did not want to disclose what happened with his parents.  Patient's description of current relationship with people who raised him/her: grandparents deceased How were you  disciplined when you got in trouble as a child/adolescent?: n/a  Does patient have siblings?: Yes Number of Siblings: 1 Description of patient's current relationship with siblings: "I don't ever see my brother."  Did patient suffer any verbal/emotional/physical/sexual abuse as a child?: No Did patient suffer from severe childhood neglect?: No Has patient ever been sexually abused/assaulted/raped as an adolescent or adult?: No Was the patient ever a victim of a crime or a disaster?: No Witnessed domestic violence?: No Has patient been effected by domestic violence as an adult?: No  Education:  Highest grade of school patient has completed: GED Currently a student?: No Name of school: n/a  Learning disability?: No  Employment/Work Situation:   Employment situation: On disability Why is patient on disability: mental health; hand/arm deformity How long has patient been on disability: over 20 years Patient's job has been impacted by current illness: No What is the longest time patient has a held a job?: never held a job Where was the patient employed at that time?: never held a job.  Has patient ever been in the [employer]?: No Has patient ever served in combat?: No Did You Receive Any Psychiatric Treatment/Services While in the [employer]?: No Are There Guns or Other Weapons in Your Home?: No Are These Weapons Safely Secured?:  (n/a)  Financial Resources:   Financial resources: Insurance claims handler, Medicare, Medicaid Does patient have a representative payee or guardian?: No  Alcohol/Substance Abuse:   What has been your use of drugs/alcohol within the last 12 months?: marijuana daily; crack cocaine every few days; alcohol 2-3 40oz beers daily. increased use over past several months--due to isolation, boredom, SI thoughts/ AH "I'm trying to get rid of the voices."  If attempted suicide, did drugs/alcohol play a role in this?: No (pt reports  increased SI thoughts recently. ) Alcohol/Substance  Abuse Treatment Hx: Denies past history If yes, describe treatment: n/a  Has alcohol/substance abuse ever caused legal problems?: No  Social Support System:   Forensic psychologist System: Poor Describe Community Support System: no network of friends or family support; "my wife lives in [place]."  Type of faith/religion: christian How does patient's faith help to cope with current illness?: n/a   Leisure/Recreation:   Leisure and Hobbies: "nothing."   Strengths/Needs:   What things does the patient do well?: pt declined to answer In what areas does patient struggle / problems for patient: pt declined to answer  Discharge Plan:   Does patient have access to transportation?: Yes (bus) Will patient be returning to same living situation after discharge?: No Plan for living situation after discharge: [facility]  Currently receiving community mental health services: No If no, would patient like referral for services when discharged?: Yes (What county?) Medical sales representative) Does patient have financial barriers related to discharge medications?: No  Summary/Recommendations:   Summary and Recommendations (to be completed by the evaluator): Patient is 47yo male living in [place], [place] He presents to the hospital seeking treatment for auditory hallucinations, suicidal ideations, and for medication stabilization. Patient has a diagnosis of MDD and alcohol use disorder. He reports alcohol abuse daily; marijuana abuse, and crack cocaine abuse. Recommendations for patient include: crisis stabilization, therapeutic milieu, encourage group attendance and participation, medication management for mood stabilization/detox, and development of comprehensive mental wellness/sobriety plan. Patient intersted in [facility] referral. CSW assessing for appropriate referrals.   Ledell Peoples Smart LCSW 06/13/2016  2:16 PM

## 2016-06-13 NOTE — BHH Group Notes (Signed)
[facility] Group Notes:  (Nursing/MHT/Case Management/Adjunct)  Date:  06/13/2016  Time:  0900  Type of Therapy:  Nurse Education  Participation Level:  Did Not Attend  Participation Quality:    Affect:    Cognitive:    Insight:    Engagement in Group:    Modes of Intervention:    Summary of Progress/Problems: Invited but remained in bed asleep.  Merian Capron [facility] 06/13/2016, 0930

## 2016-06-13 NOTE — Plan of Care (Signed)
Problem: Health Behavior/Discharge Planning: Goal: Compliance with prescribed medication regimen will improve Outcome: Progressing Pt was compliant with scheduled HS medications tonight.

## 2016-06-13 NOTE — BHH Counselor (Signed)
PSA attempt w patient, patient asleep and unable to be awakened.  Santa Genera, LCSW Lead Clinical Social Worker Phone:  [phone]

## 2016-06-13 NOTE — H&P (Signed)
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  Diagnosis:   Patient Active Problem List   Diagnosis Date Noted  . Major depressive disorder, recurrent episode, severe, with psychosis ([place]) [F33.3] 06/12/2016  . Cocaine use disorder, severe, dependence ([place]) [F14.20] 06/12/2016  . Major depressive disorder, recurrent severe without psychotic features ([place]) [F33.2] 06/12/2016  . Seizure ([place]) [R56.9] 11/17/2011  . Tobacco abuse [Z72.0] 11/17/2011  . Alcohol abuse [F10.10] 11/17/2011  . Cocaine abuse [F14.10] 11/17/2011  . Bipolar 2 disorder ([place]) [F31.81] 11/17/2011  . Hypertension [I10] 11/17/2011  . Hydrocephalus [G91.9] 11/17/2011  . Aspiration pneumonia ([place]) [J69.0] 11/17/2011   History of Present Illness: This is an admission assessment for this 46 year old AA male with hx of drug use. He is admitted to the [facility] adult unit from the [facility] with complaints of  Cough, worsening depression & auditory hallucinations. He denies any prior psychiatric hospitalization, however, admits previous substance abuse treatment at the [facility] treatment center. He denies any sobriety times. During this assessment, Brian Lucero reports, "The ambulance took me to the hospital 2 days ago. I don't know who called the ambulance. I was just walking up the streets. I don't remember what was happening at the time. I have emotional stress for over a month now. I don't know the cause the emotional stress. I have drug abuse issues. It started when I was 46 years old, I have been smoking crack cocaine. I have been to the [place] [facility] a year before last. I have never had treatment for depression. I had attempted suicide I month ago by overdose.  I was not hospitalized. I ain't answering any more questions".  Associated Signs/Symptoms: Depression Symptoms:  depressed mood, insomnia, psychomotor agitation, anxiety,  (Hypo) Manic Symptoms:  Hallucinations, Irritable Mood,  Anxiety Symptoms:  Excessive Worry,  Psychotic Symptoms:  Hallucinations: Auditory  PTSD Symptoms: NA  Total Time spent with patient: 1 hour  Past Psychiatric History:   Is the patient at risk to self? Yes.    Has the patient been a risk to self in the past 6 months? No.  Has the patient been a risk to self within the distant past? Yes.    Is the patient a risk to others? No.  Has the patient been a risk to others in the past 6 months? No.  Has the patient been a risk to others within the distant past? No.   Prior Inpatient Therapy:   Prior Outpatient Therapy:    Alcohol Screening: 1. How often do you have a drink containing alcohol?: 4 or more times a week 2. How many drinks containing alcohol do you have on a typical day when you are drinking?: 10 or more (3-4 40 oz beers daily, pt reports ) 3. How often do you have six or more drinks on one occasion?: Daily or almost daily Preliminary Score: 8 4. How often during the last year have you found that you were not able to stop drinking once you had started?: Monthly 5. How often during the last year have you failed to do what was normally expected from you becasue of drinking?: Monthly 6. How often during the last year have you needed a first drink in the morning to get yourself going after a heavy drinking session?: Monthly  7. How often during the last year have you had a feeling of guilt of remorse after drinking?: Monthly 8. How often during the last year have you been unable to remember what happened the night before because you had been drinking?: Never 9. Have you or someone else been injured as a result of your drinking?: No 10. Has a relative or friend or a doctor or another health worker been  concerned about your drinking or suggested you cut down?: No Alcohol Use Disorder Identification Test Final Score (AUDIT): 20 Brief Intervention: Yes  Substance Abuse History in the last 12 months:  Yes.    Consequences of Substance Abuse: Medical Consequences:  Liver damage, Possible death by overdose Legal Consequences:  Arrests, jail time, Loss of driving privilege. Family Consequences:  Family discord, divorce and or separation.  Previous Psychotropic Medications: Yes   Psychological Evaluations: No   Past Medical History:  Past Medical History:  Diagnosis Date  . Anxiety   . Bipolar 2 disorder ([place])   . Depression   . Hypertension   . Pneumothorax    as a baby in the 79's  . Seizures ([place])    History reviewed. No pertinent surgical history.  Family History:  Family History  Problem Relation Age of Onset  . Diabetes type II Mother   . Diabetes type II     Family Psychiatric  History: Denies any family hx of mental illness.  Tobacco Screening: Have you used any form of tobacco in the last 30 days? (Cigarettes, Smokeless Tobacco, Cigars, and/or Pipes): Yes Tobacco use, Select all that apply: 5 or more cigarettes per day Are you interested in Tobacco Cessation Medications?: Yes, will notify MD for an order Counseled patient on smoking cessation including recognizing danger situations, developing coping skills and basic information about quitting provided: Refused/Declined practical counseling  Social History:  History  Alcohol Use  . 1.2 oz/week  . 2 Cans of beer per week    Comment: 1 40oz Fri. and Sat.     History  Drug Use  . Types: Cocaine    Comment: Used yesterday    Additional Social History: Single  Allergies:  No Known Allergies  Lab Results:  Results for orders placed or performed during the hospital encounter of 06/11/16 (from the past 48 hour(s))  CBC with Differential/Platelet     Status: None   Collection Time: 06/11/16 11:51 AM  Result Value  Ref Range   WBC 9.3 4.0 - 10.5 K/uL   RBC 5.04 4.22 - 5.81 MIL/uL   Hemoglobin 15.3 13.0 - 17.0 g/dL   HCT 43.5 39.0 - 52.0 %   MCV 86.3 78.0 - 100.0 fL   MCH 30.4 26.0 - 34.0 pg   MCHC 35.2 30.0 - 36.0 g/dL   RDW 13.4 11.5 - 15.5 %   Platelets 195 150 - 400 K/uL   Neutrophils Relative % 73 %   Neutro Abs 6.8 1.7 - 7.7 K/uL   Lymphocytes Relative 16 %   Lymphs Abs 1.4 0.7 - 4.0 K/uL   Monocytes Relative 10 %   Monocytes Absolute 0.9 0.1 - 1.0 K/uL   Eosinophils Relative 1 %   Eosinophils Absolute 0.1 0.0 - 0.7 K/uL   Basophils Relative 0 %   Basophils Absolute 0.0 0.0 - 0.1 K/uL  Basic metabolic panel     Status: Abnormal   Collection Time: 06/11/16 11:51 AM  Result Value Ref Range   Sodium 137 135 - 145 mmol/L   Potassium  3.9 3.5 - 5.1 mmol/L   Chloride 99 (L) 101 - 111 mmol/L   CO2 29 22 - 32 mmol/L   Glucose, Bld 123 (H) 65 - 99 mg/dL   BUN 15 6 - 20 mg/dL   Creatinine, Ser 1.19 0.61 - 1.24 mg/dL   Calcium 9.5 8.9 - 10.3 mg/dL   GFR calc non Af Amer >60 >60 mL/min   GFR calc Af Amer >60 >60 mL/min    Comment: (NOTE) The eGFR has been calculated using the CKD EPI equation. This calculation has not been validated in all clinical situations. eGFR's persistently <60 mL/min signify possible Chronic Kidney Disease.    Anion gap 9 5 - 15  Acetaminophen level     Status: Abnormal   Collection Time: 06/11/16  7:42 PM  Result Value Ref Range   Acetaminophen (Tylenol), Serum <10 (L) 10 - 30 ug/mL    Comment:        THERAPEUTIC CONCENTRATIONS VARY SIGNIFICANTLY. A RANGE OF 10-30 ug/mL MAY BE AN EFFECTIVE CONCENTRATION FOR MANY PATIENTS. HOWEVER, SOME ARE BEST TREATED AT CONCENTRATIONS OUTSIDE THIS RANGE. ACETAMINOPHEN CONCENTRATIONS >150 ug/mL AT 4 HOURS AFTER INGESTION AND >50 ug/mL AT 12 HOURS AFTER INGESTION ARE OFTEN ASSOCIATED WITH TOXIC REACTIONS.   Ethanol     Status: None   Collection Time: 06/11/16  7:42 PM  Result Value Ref Range   Alcohol, Ethyl (B) <5  <5 mg/dL    Comment:        LOWEST DETECTABLE LIMIT FOR SERUM ALCOHOL IS 5 mg/dL FOR MEDICAL PURPOSES ONLY   Salicylate level     Status: None   Collection Time: 06/11/16  7:42 PM  Result Value Ref Range   Salicylate Lvl <6.9 2.8 - 30.0 mg/dL  Urine rapid drug screen (hosp performed)     Status: Abnormal   Collection Time: 06/12/16  6:00 AM  Result Value Ref Range   Opiates POSITIVE (A) NONE DETECTED   Cocaine POSITIVE (A) NONE DETECTED   Benzodiazepines NONE DETECTED NONE DETECTED   Amphetamines NONE DETECTED NONE DETECTED   Tetrahydrocannabinol NONE DETECTED NONE DETECTED   Barbiturates NONE DETECTED NONE DETECTED    Comment:        DRUG SCREEN FOR MEDICAL PURPOSES ONLY.  IF CONFIRMATION IS NEEDED FOR ANY PURPOSE, NOTIFY LAB WITHIN 5 DAYS.        LOWEST DETECTABLE LIMITS FOR URINE DRUG SCREEN Drug Class       Cutoff (ng/mL) Amphetamine      1000 Barbiturate      200 Benzodiazepine   794 Tricyclics       801 Opiates          300 Cocaine          300 THC              50    Blood Alcohol level:  Lab Results  Component Value Date   ETH <5 06/11/2016   ETH <11 65/53/7482   Metabolic Disorder Labs:  No results found for: HGBA1C, MPG No results found for: PROLACTIN Lab Results  Component Value Date   CHOL 209 (H) 07/15/2008   TRIG 106 07/15/2008   HDL 89 07/15/2008   CHOLHDL 2.3 Ratio 07/15/2008   VLDL 21 07/15/2008   LDLCALC 99 07/15/2008   Current Medications: Current Facility-Administered Medications  Medication Dose Route Frequency Provider Last Rate Last Dose  . acetaminophen (TYLENOL) tablet 650 mg  650 mg Oral Q4H PRN Patrecia Pour, NP      .  albuterol (PROVENTIL HFA;VENTOLIN HFA) 108 (90 Base) MCG/ACT inhaler 2 puff  2 puff Inhalation Q4H Patrecia Pour, NP   2 puff at 06/12/16 2008  . asenapine (SAPHRIS) sublingual tablet 5 mg  5 mg Sublingual QHS Patrecia Pour, NP   5 mg at 06/12/16 2137  . cloNIDine (CATAPRES) tablet 0.1 mg  0.1 mg Oral QID Encarnacion Slates, NP   0.1 mg at 06/12/16 2137   Followed by  . [START ON 06/15/2016] cloNIDine (CATAPRES) tablet 0.1 mg  0.1 mg Oral BH-qamhs Encarnacion Slates, NP       Followed by  . [START ON 06/17/2016] cloNIDine (CATAPRES) tablet 0.1 mg  0.1 mg Oral QAC breakfast Encarnacion Slates, NP      . dicyclomine (BENTYL) tablet 20 mg  20 mg Oral Q6H PRN Encarnacion Slates, NP      . feeding supplement (ENSURE ENLIVE) (ENSURE ENLIVE) liquid 237 mL  237 mL Oral BID BM Encarnacion Slates, NP   237 mL at 06/12/16 1630  . FLUoxetine (PROZAC) capsule 10 mg  10 mg Oral Daily Patrecia Pour, NP      . gabapentin (NEURONTIN) capsule 200 mg  200 mg Oral BID Patrecia Pour, NP   200 mg at 06/12/16 1651  . hydrochlorothiazide (HYDRODIURIL) tablet 25 mg  25 mg Oral Daily Patrecia Pour, NP      . hydrOXYzine (ATARAX/VISTARIL) tablet 25 mg  25 mg Oral Q6H PRN Encarnacion Slates, NP      . levofloxacin (LEVAQUIN) tablet 750 mg  750 mg Oral Daily Patrecia Pour, NP   750 mg at 06/12/16 1652  . lisinopril (PRINIVIL,ZESTRIL) tablet 20 mg  20 mg Oral Daily Patrecia Pour, NP      . loperamide (IMODIUM) capsule 2-4 mg  2-4 mg Oral PRN Encarnacion Slates, NP      . magnesium hydroxide (MILK OF MAGNESIA) suspension 30 mL  30 mL Oral Daily PRN Patrecia Pour, NP      . methocarbamol (ROBAXIN) tablet 500 mg  500 mg Oral Q8H PRN Encarnacion Slates, NP   500 mg at 06/13/16 5102  . naproxen (NAPROSYN) tablet 500 mg  500 mg Oral BID PRN Encarnacion Slates, NP      . nicotine (NICODERM CQ - dosed in mg/24 hours) patch 21 mg  21 mg Transdermal Daily Encarnacion Slates, NP   21 mg at 06/12/16 1652  . ondansetron (ZOFRAN) tablet 4 mg  4 mg Oral Q8H PRN Patrecia Pour, NP       PTA Medications: Prescriptions Prior to Admission  Medication Sig Dispense Refill Last Dose  . divalproex (DEPAKOTE) 500 MG DR tablet Take 500 mg by mouth 2 (two) times daily.   Past Week at Unknown time  . hydrochlorothiazide (HYDRODIURIL) 25 MG tablet Take 1 tablet (25 mg total) by mouth daily. 30 tablet 0  Past Week at Unknown time  . levofloxacin (LEVAQUIN) 750 MG tablet Take 1 tablet (750 mg total) by mouth daily. X 7 days 6 tablet 0   . lisinopril (PRINIVIL,ZESTRIL) 20 MG tablet Take 1 tablet (20 mg total) by mouth daily. 30 tablet 0 Past Week at Unknown time  . sertraline (ZOLOFT) 100 MG tablet Take 100 mg by mouth 3 (three) times daily.   Past Week at Unknown time   Musculoskeletal: Strength & Muscle Tone: within normal limits Gait & Station: normal Patient leans: N/A  Psychiatric Specialty Exam:  Physical Exam  Constitutional: He is oriented to person, place, and time. He appears well-developed and well-nourished.  HENT:  Head: Normocephalic.  Eyes: Pupils are equal, round, and reactive to light.  Neck: Normal range of motion.  Cardiovascular:  Elevated blood pressure  Respiratory: Effort normal.  GI: Soft.  Genitourinary:  Genitourinary Comments: Deferred  Musculoskeletal: Normal range of motion. 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  Neurological: He is alert and oriented to person, place, and time.  Skin: Skin is warm and dry.    Review of Systems  Constitutional: Negative.   HENT: Negative.   Eyes: Negative.   Respiratory: Negative.   Cardiovascular: Negative.   Gastrointestinal: Negative.   Genitourinary: Negative.   Musculoskeletal: Negative.        Deformed left arm,  Skin: Negative.   Neurological: Negative.   Endo/Heme/Allergies: Negative.   Psychiatric/Behavioral: Positive for depression, hallucinations (Reports auditory hallucinations.), substance abuse (UDS (+) for Opioid & Cocaine use disorder) and suicidal ideas. Negative for memory loss. The patient is nervous/anxious and has insomnia.     Blood pressure (!) 129/91, pulse 97, temperature 97.7 F (36.5 C), temperature source Oral, resp. rate 16, height '5\' 5"'$  (1.651 m), weight 47.6 kg (105 lb), SpO2 96 %.Body mass index is 17.47 kg/m.  General Appearance: Disheveled, deformed left arm from  birth.  Eye Contact:  Fair  Speech:  Clear and Coherent and Normal Rate  Volume:  Normal  Mood:  Depressed and Irritable  Affect:  Flat  Thought Process:  Coherent  Orientation:  Full (Time, Place, and Person)  Thought Content:  Rumination, admits auditory hallucinations (whispers).  Suicidal Thoughts:  Yes.  without intent/plan, able to contract for safety.  Homicidal Thoughts:  Denies any thoughts, plans or intent.  Memory:  Immediate;   Good Recent;   Good Remote;   Good  Judgement:  Fair  Insight:  Lacking  Psychomotor Activity:  Decreased  Concentration:  Concentration: Poor and Attention Span: Poor  Recall:  [employer] of Knowledge:  Fair  Language:  Good  Akathisia:  Negative  Handed:  Right  AIMS (if indicated):     Assets:  Communication Skills Desire for Improvement  ADL's:  Intact  Cognition:  WNL  Sleep:  Number of Hours: 6.75   Treatment Plan/Recommendations: 1. Admit for crisis management and stabilization, estimated length of stay 3-5 days.  2. Medication management to reduce current symptoms to base line and improve the patient's overall level of functioning: See MAR, Md's SRA & plan of care.  3. Treat health problems as indicated.  4. Develop treatment plan to decrease risk of relapse upon discharge and the need for readmission.  5. Psycho-social education regarding relapse prevention and self care.  6. Health care follow up as needed for medical problems.  7. Review, reconcile, and reinstate any pertinent home medications for other health issues where appropriate. 8. Call for consults with hospitalist for any additional specialty patient care services as needed.  Observation Level/Precautions:  15 minute checks  Laboratory:  Per ED, UDS (+) for Opioid & Cocaine  Psychotherapy: Group sesssions  Medications: See MAR   Consultations: As needed  Discharge Concerns: Safety, mood stability.   Estimated LOS: 2-4 days.  Other: Admit to [facility].     Physician  Treatment Plan for Primary Diagnosis: Will initiate medication management for mood stability. Set up an outpatient psychiatric services for medication management. Will encourage medication adherence with psychiatric medications.  Long Term Goal(s): Improvement  in symptoms so as ready for discharge  Short Term Goals: Ability to identify changes in lifestyle to reduce recurrence of condition will improve, Ability to verbalize feelings will improve, Ability to disclose and discuss suicidal ideas and Ability to demonstrate self-control will improve  Physician Treatment Plan for Secondary Diagnosis: Active Problems:   Major depressive disorder, recurrent severe without psychotic features ([place])  Long Term Goal(s): Improvement in symptoms so as ready for discharge  Short Term Goals: Ability to identify and develop effective coping behaviors will improve, Compliance with prescribed medications will improve and Ability to identify triggers associated with substance abuse/mental health issues will improve  I certify that inpatient services furnished can reasonably be expected to improve the patient's condition.    Encarnacion Slates, NP, PMHNP, FNP-BC 4/10/201810:18 AM

## 2016-06-13 NOTE — Progress Notes (Signed)
NUTRITION ASSESSMENT  Pt identified as at risk on the Malnutrition Screen Tool  INTERVENTION: 1. Supplements: Continue Ensure Enlive po BID, each supplement provides 350 kcal and 20 grams of protein  NUTRITION DIAGNOSIS: Unintentional weight loss related to sub-optimal intake as evidenced by pt report.   Goal: Pt to meet >/= 90% of their estimated nutrition needs.  Monitor:  PO intake  Assessment:  Pt admitted with depression, and substance abuse. Pt reports drinking 3-4 40 oz beers daily and using cocaine weekly. Pt is underweight based on BMI of 17.5 kg/m^2. No recent weight changes noted in chart. Pt has been ordered Ensure supplements ,will continue order given increased needs.   Height: Ht Readings from Last 1 Encounters:  06/12/16  (1.651 m)    Weight: Wt Readings from Last 1 Encounters:  06/12/16 105 lb (47.6 kg)    Weight Hx: Wt Readings from Last 10 Encounters:  06/12/16 105 lb (47.6 kg)  07/16/13 125 lb (56.7 kg)  11/18/11 108 lb 14.5 oz (49.4 kg)    BMI:  Body mass index is 17.47 kg/m. Pt meets criteria for underweight based on current BMI.  Estimated Nutritional Needs: Kcal: 30-35 kcal/kg Protein: > 1.5 gram protein/kg Fluid: 1 ml/kcal  Diet Order: Diet Heart Room service appropriate? Yes; Fluid consistency: Thin Pt is also offered choice of unit snacks mid-morning and mid-afternoon.  Pt is eating as desired.   Lab results and medications reviewed.   Tilda Franco, MS, RD, LDN Pager: [phone] After Hours Pager: [phone]

## 2016-06-13 NOTE — BHH Suicide Risk Assessment (Signed)
[facility] Admission Suicide Risk Assessment   Nursing information obtained from:    Demographic factors:    Current Mental Status:    Loss Factors:    Historical Factors:    Risk Reduction Factors:     Total Time spent with patient: 20 minutes Principal Problem: <principal problem not specified> Diagnosis:   Patient Active Problem List   Diagnosis Date Noted  . Major depressive disorder, recurrent episode, severe, with psychosis ([facility]) [F33.3] 06/12/2016  . Cocaine use disorder, severe, dependence ([facility]) [F14.20] 06/12/2016  . Major depressive disorder, recurrent severe without psychotic features ([facility]) [F33.2] 06/12/2016  . Seizure ([facility]) [R56.9] 11/17/2011  . Tobacco abuse [Z72.0] 11/17/2011  . Alcohol abuse [F10.10] 11/17/2011  . Cocaine abuse [F14.10] 11/17/2011  . Bipolar 2 disorder ([facility]) [F31.81] 11/17/2011  . Hypertension [I10] 11/17/2011  . Hydrocephalus [G91.9] 11/17/2011  . Aspiration pneumonia ([facility]) [J69.0] 11/17/2011   Subjective Data:  46 yo AAF presented to the ER via EMS. Reports suicidal thoughts and hallucinations. Very irritable and easily agitated. UDS was positive for cocaine and opiates. Malodorous and not willing to engage. Known history of substance use Known history of impulsive behavior.   Continued Clinical Symptoms:  Alcohol Use Disorder Identification Test Final Score (AUDIT): 20 The "Alcohol Use Disorders Identification Test", Guidelines for Use in Primary Care, Second Edition.  World Science writer [facility]). Score between 0-7:  no or low risk or alcohol related problems. Score between 8-15:  moderate risk of alcohol related problems. Score between 16-19:  high risk of alcohol related problems. Score 20 or above:  warrants further diagnostic evaluation for alcohol dependence and treatment.   CLINICAL FACTORS:   Psychosis Substance use disorder   Musculoskeletal: Strength & Muscle Tone: within normal limits Gait & Station: normal Patient leans:  N/A  Psychiatric Specialty Exam: Physical Exam unable to assess  ROS unable to assess  Blood pressure 119/81, pulse 72, temperature 97.7 F (36.5 C), temperature source Oral, resp. rate 16, height  (1.651 m), weight 47.6 kg (105 lb), SpO2 96 %.Body mass index is 17.47 kg/m.  General Appearance: Disheveled and Guarded  Eye Contact:  Poor  Speech:  Increased tone  Volume:  Normal  Mood:  Dysphoric  Affect:  Blunted and mood congruent   Thought Process:  Linear  Orientation:  Unable to assess  Thought Content:  Actively hallucinating   Suicidal Thoughts:  Yes.  with intent/plan  Homicidal Thoughts:  No  Memory:  Immediate;   Poor Recent;   Poor Remote;   Poor  Judgement:  Impaired  Insight:  Shallow  Psychomotor Activity:  Psychomotor Retardation  Concentration:  Concentration: Poor and Attention Span: Poor  Recall:  Unable to assess  Fund of Knowledge:  Poor  Language:  Fair  Akathisia:    Handed:    AIMS (if indicated):     Assets:  Unable to assess  ADL's:  Impaired  Cognition:  Impaired,  Mild  Sleep:  Number of Hours: 6.75      COGNITIVE FEATURES THAT CONTRIBUTE TO RISK:  Loss of executive function    SUICIDE RISK:   Severe:  Frequent, intense, and enduring suicidal ideation, specific plan, no subjective intent, but some objective markers of intent (i.e., choice of lethal method), the method is accessible, some limited preparatory behavior, evidence of impaired self-control, severe dysphoria/symptomatology, multiple risk factors present, and few if any protective factors, particularly a lack of social support.  PLAN OF CARE:   Would continue home medications and reassess  him later.   I certify that inpatient services furnished can reasonably be expected to improve the patient's condition.   Georgiann Cocker, MD 06/13/2016, 2:43 PM

## 2016-06-13 NOTE — Tx Team (Signed)
Interdisciplinary Treatment and Diagnostic Plan Update  06/13/2016 Time of Session: 0930 Brian Lucero MRN: [id number]  Principal Diagnosis: MDD  Secondary Diagnoses: Active Problems:   Major depressive disorder, recurrent severe without psychotic features (HCC)   Current Medications:  Current Facility-Administered Medications  Medication Dose Route Frequency Provider Last Rate Last Dose  . acetaminophen (TYLENOL) tablet 650 mg  650 mg Oral Q4H PRN Charm Rings, NP      . albuterol (PROVENTIL HFA;VENTOLIN HFA) 108 (90 Base) MCG/ACT inhaler 2 puff  2 puff Inhalation Q4H Charm Rings, NP   2 puff at 06/12/16 2008  . asenapine (SAPHRIS) sublingual tablet 5 mg  5 mg Sublingual QHS Charm Rings, NP   5 mg at 06/12/16 2137  . cloNIDine (CATAPRES) tablet 0.1 mg  0.1 mg Oral QID Sanjuana Kava, NP   0.1 mg at 06/12/16 2137   Followed by  . [START ON 06/15/2016] cloNIDine (CATAPRES) tablet 0.1 mg  0.1 mg Oral BH-qamhs Sanjuana Kava, NP       Followed by  . [START ON 06/17/2016] cloNIDine (CATAPRES) tablet 0.1 mg  0.1 mg Oral QAC breakfast Sanjuana Kava, NP      . dicyclomine (BENTYL) tablet 20 mg  20 mg Oral Q6H PRN Sanjuana Kava, NP      . feeding supplement (ENSURE ENLIVE) (ENSURE ENLIVE) liquid 237 mL  237 mL Oral BID BM Sanjuana Kava, NP   237 mL at 06/12/16 1630  . FLUoxetine (PROZAC) capsule 10 mg  10 mg Oral Daily Charm Rings, NP      . gabapentin (NEURONTIN) capsule 200 mg  200 mg Oral BID Charm Rings, NP   200 mg at 06/12/16 1651  . hydrochlorothiazide (HYDRODIURIL) tablet 25 mg  25 mg Oral Daily Charm Rings, NP      . hydrOXYzine (ATARAX/VISTARIL) tablet 25 mg  25 mg Oral Q6H PRN Sanjuana Kava, NP      . levofloxacin (LEVAQUIN) tablet 750 mg  750 mg Oral Daily Charm Rings, NP   750 mg at 06/12/16 1652  . lisinopril (PRINIVIL,ZESTRIL) tablet 20 mg  20 mg Oral Daily Charm Rings, NP      . loperamide (IMODIUM) capsule 2-4 mg  2-4 mg Oral PRN Sanjuana Kava, NP      .  magnesium hydroxide (MILK OF MAGNESIA) suspension 30 mL  30 mL Oral Daily PRN Charm Rings, NP      . methocarbamol (ROBAXIN) tablet 500 mg  500 mg Oral Q8H PRN Sanjuana Kava, NP   500 mg at 06/13/16 4098  . naproxen (NAPROSYN) tablet 500 mg  500 mg Oral BID PRN Sanjuana Kava, NP      . nicotine (NICODERM CQ - dosed in mg/24 hours) patch 21 mg  21 mg Transdermal Daily Sanjuana Kava, NP   21 mg at 06/12/16 1652  . ondansetron (ZOFRAN) tablet 4 mg  4 mg Oral Q8H PRN Charm Rings, NP       PTA Medications: Prescriptions Prior to Admission  Medication Sig Dispense Refill Last Dose  . divalproex (DEPAKOTE) 500 MG DR tablet Take 500 mg by mouth 2 (two) times daily.   Past Week at Unknown time  . hydrochlorothiazide (HYDRODIURIL) 25 MG tablet Take 1 tablet (25 mg total) by mouth daily. 30 tablet 0 Past Week at Unknown time  . levofloxacin (LEVAQUIN) 750 MG tablet Take 1 tablet (750 mg total) by mouth  daily. X 7 days 6 tablet 0   . lisinopril (PRINIVIL,ZESTRIL) 20 MG tablet Take 1 tablet (20 mg total) by mouth daily. 30 tablet 0 Past Week at Unknown time  . sertraline (ZOLOFT) 100 MG tablet Take 100 mg by mouth 3 (three) times daily.   Past Week at Unknown time    Patient Stressors: Substance abuse  Patient Strengths: Wellsite geologist fund of knowledge  Treatment Modalities: Medication Management, Group therapy, Case management,  1 to 1 session with clinician, Psychoeducation, Recreational therapy.   Physician Treatment Plan for Primary Diagnosis: MDD  Medication Management: Evaluate patient's response, side effects, and tolerance of medication regimen.  Therapeutic Interventions: 1 to 1 sessions, Unit Group sessions and Medication administration.  Evaluation of Outcomes: Progressing  Physician Treatment Plan for Secondary Diagnosis: Active Problems:   Major depressive disorder, recurrent severe without psychotic features (HCC)  Long Term Goal(s):     Short Term Goals:        Medication Management: Evaluate patient's response, side effects, and tolerance of medication regimen.  Therapeutic Interventions: 1 to 1 sessions, Unit Group sessions and Medication administration.  Evaluation of Outcomes: Progressing   RN Treatment Plan for Primary Diagnosis: MDD Long Term Goal(s): Knowledge of disease and therapeutic regimen to maintain health will improve  Short Term Goals: Ability to remain free from injury will improve, Ability to verbalize feelings will improve and Ability to disclose and discuss suicidal ideas  Medication Management: RN will administer medications as ordered by provider, will assess and evaluate patient's response and provide education to patient for prescribed medication. RN will report any adverse and/or side effects to prescribing provider.  Therapeutic Interventions: 1 on 1 counseling sessions, Psychoeducation, Medication administration, Evaluate responses to treatment, Monitor vital signs and CBGs as ordered, Perform/monitor CIWA, COWS, AIMS and Fall Risk screenings as ordered, Perform wound care treatments as ordered.  Evaluation of Outcomes: Progressing   LCSW Treatment Plan for Primary Diagnosis: MDD Long Term Goal(s): Safe transition to appropriate next level of care at discharge, Engage patient in therapeutic group addressing interpersonal concerns.  Short Term Goals: Engage patient in aftercare planning with referrals and resources, Facilitate patient progression through stages of change regarding substance use diagnoses and concerns and Identify triggers associated with mental health/substance abuse issues  Therapeutic Interventions: Assess for all discharge needs, 1 to 1 time with Social worker, Explore available resources and support systems, Assess for adequacy in community support network, Educate family and significant other(s) on suicide prevention, Complete Psychosocial Assessment, Interpersonal group therapy.  Evaluation of  Outcomes: Progressing   Progress in Treatment: Attending groups: Yes. Participating in groups: Yes. Taking medication as prescribed: Yes. Toleration medication: Yes. Family/Significant other contact made: No, will contact:  family member if patient consents Patient understands diagnosis: Yes. Discussing patient identified problems/goals with staff: Yes. Medical problems stabilized or resolved: Yes. Denies suicidal/homicidal ideation: No. Passive SI/Able to contract for safety on the unit.  Issues/concerns per patient self-inventory: No. Other: n/a  New problem(s) identified: No, Describe:  n/a  New Short Term/Long Term Goal(s): detox; elimination of AH/SI, medication stabilization; development of comprehensive mental wellness/sobriety plan.   Discharge Plan or Barriers: CSW assessing for appropriate referrals. Pt reports no current mental health providers.   Reason for Continuation of Hospitalization: Depression Hallucinations Medication stabilization Suicidal ideation Withdrawal symptoms  Estimated Length of Stay: 3-5 days   Attendees: Patient: 06/13/2016 8:37 AM  Physician: Dr. Jackquline Berlin MD 06/13/2016 8:37 AM  Nursing:  06/13/2016 8:37 AM  RN Care  Manager: Onnie Boer CM 06/13/2016 8:37 AM  Social Worker: [name], LCSW 06/13/2016 8:37 AM  Recreational Therapist: Juliann Pares 06/13/2016 8:37 AM  Other: Armandina Stammer NP 06/13/2016 8:37 AM  Other:  06/13/2016 8:37 AM  Other: 06/13/2016 8:37 AM    Scribe for Treatment Team: Ledell Peoples Smart, LCSW 06/13/2016 8:37 AM

## 2016-06-13 NOTE — [facility] Group Notes (Signed)
[facility] LCSW Group Therapy  06/13/2016 1:11 PM  Type of Therapy:  Group Therapy  Participation Level:  Did Not Attend-pt invited. Chose to remain in bed.   Summary of Progress/Problems: MHA Speaker came to talk about his personal journey with substance abuse and addiction. The pt processed ways by which to relate to the speaker. MHA speaker provided handouts and educational information pertaining to groups and services offered by the [facility].   Rilee Wendling N Smart LCSW 06/13/2016, 1:11 PM

## 2016-06-13 NOTE — Progress Notes (Signed)
D: Pt was in bed in his room upon initial approach.  Pt presents with flat affect and depressed mood.  He is guarded, interacts with writer cautiously, and forwards very little information.  He endorses SI with a plan to walk into traffic.  Denies HI, denies hallucinations, denies pain.  Pt has been isolative to his room for the majority of the night.  He denies withdrawal symptoms.  Pt has unsteady gait.    A: Introduced self to pt.  Offered support and encouragement. Medications administered per order.  Q15 minute safety checks maintained.  R: Pt is safe on the unit.  Pt is compliant with medications.  Pt verbally contracts for safety.  Will continue to monitor and assess.

## 2016-06-13 NOTE — Progress Notes (Signed)
Recreation Therapy Notes  Animal-Assisted Activity (AAA) Program Checklist/Progress Notes Patient Eligibility Criteria Checklist & Daily Group note for Rec TxIntervention  Date: 04.10.2018 Time: 2:45pm Location: [address] Dayroom   AAA/T Program Assumption of Risk Form signed by Patient/ or Parent Legal Guardian Yes  Patient is free of allergies or sever asthma Yes  Patient reports no fear of animals Yes  Patient reports no history of cruelty to animals Yes  Patient understands his/her participation is voluntary Yes  Patient washes hands before animal contact Yes  Patient washes hands after animal contact Yes  Behavioral Response: Did not attend.   Karen Kinnard L Galen Russman, LRT/CTRS       Brian Lucero L 06/13/2016 3:06 PM 

## 2016-06-13 NOTE — Progress Notes (Signed)
D: Patient has been in bed asleep most of the day. Did get up late afternoon, went to dinner and is presently in the dayroom. Spoke with patient 1:1. Patient's affect irritable with congruent mood. "Why do you keep asking me all of these questions?"  Denies pain, however productive cough observed.  A: Asked patient to get up 3 separate times for AM meds however he refused. Meds were then brought to him at the bedside. No prns required or requested. Emotional support offered and self inventory reviewed. Encouraged completion of Suicide Safety Plan. Discussed POC with MD, SW.  Fall precautions in place and reviewed.   R: Patient verbalizes understanding of POC, falls teaching. When asked about AVH patient states, "I just woke up. I haven't had any yet." Patient reporting passive SI but makes a verbal contract with this Clinical research associate. No HI and remains safe on level III obs.

## 2016-06-13 NOTE — Social Work (Signed)
Referred to [facility], is [place] resident.  Anne Cunningham, LCSW Lead Clinical Social Worker Phone:  [phone]  

## 2016-06-14 DIAGNOSIS — F19951 Other psychoactive substance use, unspecified with psychoactive substance-induced psychotic disorder with hallucinations: Secondary | ICD-10-CM

## 2016-06-14 MED ORDER — PRAZOSIN HCL 2 MG PO CAPS
2.0000 mg | ORAL_CAPSULE | Freq: Every day | ORAL | Status: DC
  Filled 2016-06-14: qty 1

## 2016-06-14 MED ORDER — FLUOXETINE HCL 20 MG PO CAPS
20.0000 mg | ORAL_CAPSULE | Freq: Every day | ORAL | Status: DC
  Administered 2016-06-15 – 2016-06-26 (×12): 20 mg via ORAL
  Filled 2016-06-14 (×7): qty 1
  Filled 2016-06-14 (×3): qty 14
  Filled 2016-06-14 (×3): qty 1
  Filled 2016-06-14: qty 14
  Filled 2016-06-14: qty 1

## 2016-06-14 MED ORDER — RISPERIDONE 0.5 MG PO TABS
0.5000 mg | ORAL_TABLET | Freq: Two times a day (BID) | ORAL | Status: DC
  Administered 2016-06-14 – 2016-06-22 (×16): 0.5 mg via ORAL
  Filled 2016-06-14 (×2): qty 1
  Filled 2016-06-14 (×3): qty 28
  Filled 2016-06-14 (×12): qty 1
  Filled 2016-06-14 (×2): qty 28
  Filled 2016-06-14 (×2): qty 1
  Filled 2016-06-14: qty 28
  Filled 2016-06-14: qty 1

## 2016-06-14 MED ORDER — ALUM & MAG HYDROXIDE-SIMETH 200-200-20 MG/5ML PO SUSP
30.0000 mL | Freq: Four times a day (QID) | ORAL | Status: DC | PRN
  Administered 2016-06-14 – 2016-06-16 (×2): 30 mL via ORAL
  Filled 2016-06-14 (×2): qty 30

## 2016-06-14 NOTE — Progress Notes (Signed)
Nursing Progress Note: 7p-7a D: Pt currently presents with a pleasant/anxious/brightens on approach affect and behavior. Pt states "I feel good today. I ate better today than I have in years." Interacting appropriately with milieu. Pt reports good sleep with current medication regimen.   A: Pt provided with medications per providers orders. Pt's labs and vitals were monitored throughout the night. Pt supported emotionally and encouraged to express concerns and questions. Pt educated on medications.  R: Pt's safety ensured with 15 minute and environmental checks. Pt currently denies SI/HI/Self Harm and AVH. Pt verbally contracts to seek staff if SI/HI or A/VH occurs and to consult with staff before acting on any harmful thoughts. Will continue to monitor.

## 2016-06-14 NOTE — [facility] Group Notes (Signed)
[facility] LCSW Group Therapy  06/14/2016 10:39 AM  Type of Therapy:  Group Therapy  Participation Level:  Active  Participation Quality:  Attentive  Affect:  Appropriate  Cognitive:  Alert and Oriented  Insight:  Improving  Engagement in Therapy:  Improving  Modes of Intervention:  Discussion, Education, Exploration, Problem-solving, Socialization and Support  Summary of Progress/Problems: Emotion Regulation: This group focused on both positive and negative emotion identification and allowed group members to process ways to identify feelings, regulate negative emotions, and find healthy ways to manage internal/external emotions. Group members were asked to reflect on a time when their reaction to an emotion led to a negative outcome and explored how alternative responses using emotion regulation would have benefited them. Group members were also asked to discuss a time when emotion regulation was utilized when a negative emotion was experienced.   Rahil Passey N Smart LCSW 06/14/2016, 10:39 AM

## 2016-06-14 NOTE — Progress Notes (Signed)
Nursing Progress Note: 7p-7a D: Pt currently presents with a irritable/depressed affect and behavior. Pt states "I'm tired. I'm not taking any meds right now. I'll get you if I need anything." Not interacting with milieu. Pt reports good sleep with current medication regimen.   A: Pt provided with medications per providers orders. Pt's labs and vitals were monitored throughout the night. Pt supported emotionally and encouraged to express concerns and questions. Pt educated on medications.  R: Pt's safety ensured with 15 minute and environmental checks. Pt currently denies HI and AVH and endorses Passive SI. Pt verbally contracts to seek staff if SI/HI or A/VH occurs and to consult with staff before acting on any harmful thoughts. Will continue to monitor.

## 2016-06-14 NOTE — Progress Notes (Addendum)
[facility] MD Progress Note  06/14/2016 1:19 PM Brian Lucero  MRN:  [id number] Subjective:   46 yo AAF presented to the ER via EMS. Background history of SUD. Reports suicidal thoughts and hallucinations. Very irritable and easily agitated. UDS was positive for cocaine and opiates. Malodorous and not willing to engage.  Nursing staff reports that he has been attending some of the unit groups. He is withdrawn. He has been reporting hallucinations. He refused Asenapine last night. He has not had any behavioral issues.  Seen today. Chart reviewed prior to interview. States that he quit taking his medications years ago. In the past couple of months he has been hearing voices. Says he tried to use street drugs to drown the voices. Found out that it made it worse. Says the voices are less since he has been here. They are no longer telling him to kill himself. They are critical of the way he has led his life. They tell him that he is worthless. No associated delusion. No associated passivity of will. No associated passivity of thought. Patient states that he had not been functioning well at home. Says he lost weight because he was not taking good care of himself. Says he wants to get better. Patient reports nightmares around his traumatic experience years ago. We explored treatment options. We discussed switching his antipsychotic agent to Risperidone. Patient consented to treatment after we reviewed the risks and benefits. Says he is now expectorating his cough. Slight chest pain while coughing. No chest pain with breathing. No fever. He is tolerating  antibiotics well.   Principal Problem: Substance Induced Psychotic Disorder Diagnosis:   Patient Active Problem List   Diagnosis Date Noted  . Major depressive disorder, recurrent episode, severe, with psychosis (HCC) [F33.3] 06/12/2016  . Cocaine use disorder, severe, dependence (HCC) [F14.20] 06/12/2016  . Major depressive disorder, recurrent severe without  psychotic features (HCC) [F33.2] 06/12/2016  . Seizure (HCC) [R56.9] 11/17/2011  . Tobacco abuse [Z72.0] 11/17/2011  . Alcohol abuse [F10.10] 11/17/2011  . Cocaine abuse [F14.10] 11/17/2011  . Bipolar 2 disorder (HCC) [F31.81] 11/17/2011  . Hypertension [I10] 11/17/2011  . Hydrocephalus [G91.9] 11/17/2011  . Aspiration pneumonia (HCC) [J69.0] 11/17/2011   Total Time spent with patient: 20 minutes  Past Psychiatric History: As in H&P  Past Medical History:  Past Medical History:  Diagnosis Date  . Anxiety   . Bipolar 2 disorder (HCC)   . Depression   . Hypertension   . Pneumothorax    as a baby in the 73's  . Seizures (HCC)    History reviewed. No pertinent surgical history. Family History:  Family History  Problem Relation Age of Onset  . Diabetes type II Mother   . Diabetes type II     Family Psychiatric  History: As in H&P Social History:  History  Alcohol Use  . 1.2 oz/week  . 2 Cans of beer per week    Comment: 1 40oz Fri. and Sat.     History  Drug Use  . Types: Cocaine    Comment: Used yesterday    Social History   Social History  . Marital status: Single    Spouse name: N/A  . Number of children: N/A  . Years of education: N/A   Social History Main Topics  . Smoking status: Current Every Day Smoker    Packs/day: 0.20    Types: Cigarettes  . Smokeless tobacco: Never Used  . Alcohol use 1.2 oz/week    2 Cans  of beer per week     Comment: 1 40oz Fri. and Sat.  . Drug use: Yes    Types: Cocaine     Comment: Used yesterday  . Sexual activity: Yes   Other Topics Concern  . None   Social History Narrative  . None   Additional Social History:   Single, lives alone. Never married. No kids. Not in any relationship. No support from his family. On disability. No legal issues. No other stressors.   Sleep: Fair  Appetite:  better  Current Medications: Current Facility-Administered Medications  Medication Dose Route Frequency Provider Last Rate  Last Dose  . acetaminophen (TYLENOL) tablet 650 mg  650 mg Oral Q4H PRN Charm Rings, NP      . albuterol (PROVENTIL HFA;VENTOLIN HFA) 108 (90 Base) MCG/ACT inhaler 2 puff  2 puff Inhalation Q4H Charm Rings, NP   2 puff at 06/14/16 1157  . asenapine (SAPHRIS) sublingual tablet 5 mg  5 mg Sublingual BID Georgiann Cocker, MD   5 mg at 06/13/16 1706  . cloNIDine (CATAPRES) tablet 0.1 mg  0.1 mg Oral QID Sanjuana Kava, NP   0.1 mg at 06/14/16 1157   Followed by  . [START ON 06/15/2016] cloNIDine (CATAPRES) tablet 0.1 mg  0.1 mg Oral BH-qamhs Sanjuana Kava, NP       Followed by  . [START ON 06/17/2016] cloNIDine (CATAPRES) tablet 0.1 mg  0.1 mg Oral QAC breakfast Sanjuana Kava, NP      . dicyclomine (BENTYL) tablet 20 mg  20 mg Oral Q6H PRN Sanjuana Kava, NP      . feeding supplement (ENSURE ENLIVE) (ENSURE ENLIVE) liquid 237 mL  237 mL Oral BID BM Sanjuana Kava, NP   237 mL at 06/14/16 1016  . FLUoxetine (PROZAC) capsule 10 mg  10 mg Oral Daily Charm Rings, NP   10 mg at 06/14/16 0851  . gabapentin (NEURONTIN) capsule 200 mg  200 mg Oral BID Charm Rings, NP   200 mg at 06/14/16 0851  . hydrochlorothiazide (HYDRODIURIL) tablet 25 mg  25 mg Oral Daily Charm Rings, NP   25 mg at 06/14/16 0851  . hydrOXYzine (ATARAX/VISTARIL) tablet 25 mg  25 mg Oral Q6H PRN Sanjuana Kava, NP      . levofloxacin (LEVAQUIN) tablet 750 mg  750 mg Oral Daily Charm Rings, NP   750 mg at 06/14/16 0851  . lisinopril (PRINIVIL,ZESTRIL) tablet 20 mg  20 mg Oral Daily Charm Rings, NP   20 mg at 06/14/16 0851  . loperamide (IMODIUM) capsule 2-4 mg  2-4 mg Oral PRN Sanjuana Kava, NP      . magnesium hydroxide (MILK OF MAGNESIA) suspension 30 mL  30 mL Oral Daily PRN Charm Rings, NP      . methocarbamol (ROBAXIN) tablet 500 mg  500 mg Oral Q8H PRN Sanjuana Kava, NP   500 mg at 06/13/16 1610  . naproxen (NAPROSYN) tablet 500 mg  500 mg Oral BID PRN Sanjuana Kava, NP      . nicotine (NICODERM CQ - dosed in  mg/24 hours) patch 21 mg  21 mg Transdermal Daily Sanjuana Kava, NP   21 mg at 06/14/16 0851  . ondansetron (ZOFRAN) tablet 4 mg  4 mg Oral Q8H PRN Charm Rings, NP        Lab Results: No results found for this or any previous visit (from  the past 48 hour(s)).  Blood Alcohol level:  Lab Results  Component Value Date   ETH <5 06/11/2016   ETH <11 11/17/2011    Metabolic Disorder Labs: No results found for: HGBA1C, MPG No results found for: PROLACTIN Lab Results  Component Value Date   CHOL 209 (H) 07/15/2008   TRIG 106 07/15/2008   HDL 89 07/15/2008   CHOLHDL 2.3 Ratio 07/15/2008   VLDL 21 07/15/2008   LDLCALC 99 07/15/2008    Physical Findings: AIMS: Facial and Oral Movements Muscles of Facial Expression: None, normal Lips and Perioral Area: None, normal Jaw: None, normal Tongue: None, normal,Extremity Movements Upper (arms, wrists, hands, fingers): None, normal Lower (legs, knees, ankles, toes): None, normal, Trunk Movements Neck, shoulders, hips: None, normal, Overall Severity Severity of abnormal movements (highest score from questions above): None, normal Incapacitation due to abnormal movements: None, normal Patient's awareness of abnormal movements (rate only patient's report): No Awareness, Dental Status Current problems with teeth and/or dentures?: No Does patient usually wear dentures?: No  CIWA:  CIWA-Ar Total: 1 COWS:  COWS Total Score: 1  Musculoskeletal: Strength & Muscle Tone: within normal limits Gait & Station: normal Patient leans: N/A  Psychiatric Specialty Exam: Physical Exam  Constitutional: He is oriented to person, place, and time. He appears well-developed and well-nourished.  HENT:  Head: Normocephalic and atraumatic.  Eyes: Conjunctivae and EOM are normal. Pupils are equal, round, and reactive to light.  Neck: Normal range of motion.  Cardiovascular: Normal rate, regular rhythm and normal heart sounds.   Respiratory: Effort normal and  breath sounds normal.  GI: Soft. Bowel sounds are normal.  Musculoskeletal: Normal range of motion.  Neurological: He is alert and oriented to person, place, and time. He has normal reflexes.  Skin: Skin is warm and dry.  Psychiatric:  As above.     ROS  Blood pressure (!) 142/110, pulse 92, temperature 98.5 F (36.9 C), temperature source Oral, resp. rate 18, height  (1.651 m), weight 47.6 kg (105 lb), SpO2 96 %.Body mass index is 17.47 kg/m.  General Appearance:  Better personal hygiene today. Wearing a hoody. Psychomotor retardation. Moderate rapport.    Eye Contact:  Minimal  Speech:  Normal Rate  Volume:  Decreased  Mood:  Overwhelmed and irritable   Affect:  Blunted and mood congruent.   Thought Process:  Linear  Orientation:  Full (Time, Place, and Person)  Thought Content:  Wants to get better. No delusional theme. No preoccupation with violent thoughts. No negative ruminations. No obsession.  No hallucination in any modality.   Suicidal Thoughts:  No  Homicidal Thoughts:  No  Memory:  Immediate;   Good Recent;   Fair Remote;   Fair  Judgement:  Impaired  Insight:  Good  Psychomotor Activity:  Decreased  Concentration:  Concentration: Poor and Attention Span: Fair  Recall:  [employer] of Knowledge:  Poor  Language:  Good  Akathisia:  No  Handed:    AIMS (if indicated):     Assets:  Desire for Improvement Financial Resources/Insurance Housing  ADL's:  Impaired  Cognition:  WNL  Sleep:  Number of Hours: 6.75     Treatment Plan Summary: Patient is still psychotic and irritable. He is still a risk to self and potentially others. We have agreed to adjust his medications as listed below.  Psychiatric: Substance Induced Psychotic Disorder  Medical: Pneumonia HTN Seizure disorder  Psychosocial:  Lives alone No supportive relationship.   PLAN: 1. Discontinue  Asenapine 2. Risperidone 0.5 mg BID 3. Continue other medications at current dose 4.  Encourage unit groups and activities 5. Continue to monitor mood, behavior and interaction with peers 6. SW would coordinate aftercare.    Georgiann Cocker, MD 06/14/2016, 1:19 PM

## 2016-06-14 NOTE — Progress Notes (Signed)
Recreation Therapy Notes  Date: 06/14/16 Time: 0930 Location: [address] Dayroom  Group Topic: Stress Management  Goal Area(s) Addresses:  Patient will verbalize importance of using healthy stress management.  Patient will identify positive emotions associated with healthy stress management.   Behavioral Response: Engaged  Intervention: Stress Management  Activity :  Body Scan Meditation.  LRT introduced the stress management technique of meditation.  LRT played a meditation from the Calm app that focused on body scan to examine the feelings and tension within the body.  Patients were to follow along with the meditation to played to engage in the technique.  Education:  Stress Management, Discharge Planning.   Education Outcome: Acknowledges edcuation/In group clarification offered/Needs additional education  Clinical Observations/Feedback: Pt attended group.   Caroll Rancher, LRT/CTRS        Caroll Rancher A 06/14/2016 11:14 AM

## 2016-06-14 NOTE — Progress Notes (Signed)
D: Patient up and visible in the milieu today after receiving morning meds. Much less isolative though continues to forward minimal information. Rating depression at a 7/10, hopelessness at an 8/10 and anxiety at a 6/10. Rates sleep as good, appetite as good, and concentration as good. Patient's affect blunted, mood depressed. Patient can be irritable at times though cooperative thus far this morning. States goal for today is to "get the voices to stop and go to some groups." Denies pain, physical problems. Gait remains unsteady due to musculoskeletal deformities.  A: Medicated per orders, no prns requested or needed. Emotional support offered and self inventory reviewed. Encouraged completion of Suicide Safety Plan. Discussed POC with MD, SW.  Fall p/c in place and reviewed.   R: Patient verbalizes understanding of POC, fall prevention plan. Endorsing passive SI but states, "don't worry, I will come tell you if it becomes a problem." Patient denies HI and remains safe on level III obs.

## 2016-06-14 NOTE — Progress Notes (Signed)
Patient refusing to wear yellow socks and yellow arm band. Says, "the doctor is taking me off of them." Chart reviewed and no order to support statement. Explained and strongly encouraged patient to follow unit protocol and pointed out obvious unsteady gait. Patient continued to argue. Blue socks provided for patient to have nonstick footwear as he will not wear yellow socks.

## 2016-06-15 NOTE — Progress Notes (Signed)
Patient ID: Brian Lucero, male   DOB: 05-10-1970, 46 y.o.   MRN: [id number] D   ---   Pt agrees to contract for safety and denies pain.  He is friendly and assertive and said he slept well last night and looks forward to having a good day.  His behavior at this time is APP/COOP .  He takes medications as asked with no sign of adverse effects.  Pt spends time in day room interacting with peers.  --- A  ---  Provide support  ---  R --  Pt safe on unit

## 2016-06-15 NOTE — Progress Notes (Signed)
[facility] MD Progress Note  06/15/2016 2:08 PM Hanley Woerner  MRN:  [id number] Subjective:   46 yo AAF presented to the ER via EMS. Background history of SUD. Reports suicidal thoughts and hallucinations. Very irritable and easily agitated. UDS was positive for cocaine and opiates. Malodorous and not willing to engage.  Seen today. Chart reviewed prior to interview. Feels better on risperidone. Says the voices are much less. He is coming out more and he is now able to interact with peers. Suicidal thoughts are less intense. Able to distract self. Would talk to staff if it becomes overwhelming. He has been accepted into a program. Bed would become available on Wednesday. Patient wants to go there from here. Says he would relapse if he goes home from here. Says his goal is to stay sober and complete a program on peer support. Says he wants to help others who are suffering from addiction.  Staff reports that he is more interactive and more pleasant. Has not been observed to be internally distracted. Tolerating his medications well. Has been attending groups.   SW is coordinating inpatient rehab placement.  Principal Problem: Substance Induced Psychotic Disorder Diagnosis:   Patient Active Problem List   Diagnosis Date Noted  . 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 Major depressive disorder, recurrent episode, severe, with psychosis (HCC) [F33.3] 06/12/2016  . Cocaine use disorder, severe, dependence (HCC) [F14.20] 06/12/2016  . Major depressive disorder, recurrent severe without psychotic features (HCC) [F33.2] 06/12/2016  . Seizure (HCC) [R56.9] 11/17/2011  . Tobacco abuse [Z72.0] 11/17/2011  . Alcohol abuse [F10.10] 11/17/2011  . Cocaine abuse [F14.10] 11/17/2011  . Bipolar 2 disorder (HCC) [F31.81] 11/17/2011  . Hypertension [I10] 11/17/2011  . Hydrocephalus [G91.9] 11/17/2011  . Aspiration pneumonia (HCC) [J69.0] 11/17/2011   Total Time spent  with patient: 20 minutes  Past Psychiatric History: As in H&P  Past Medical History:  Past Medical History:  Diagnosis Date  . Anxiety   . Bipolar 2 disorder (HCC)   . Depression   . Hypertension   . Pneumothorax    as a baby in the 107's  . Seizures (HCC)    History reviewed. No pertinent surgical history. Family History:  Family History  Problem Relation Age of Onset  . Diabetes type II Mother   . Diabetes type II     Family Psychiatric  History: As in H&P Social History:  History  Alcohol Use  . 1.2 oz/week  . 2 Cans of beer per week    Comment: 1 40oz Fri. and Sat.     History  Drug Use  . Types: Cocaine    Comment: Used yesterday    Social History   Social History  . Marital status: Single    Spouse name: N/A  . Number of children: N/A  . Years of education: N/A   Social History Main Topics  . Smoking status: Current Every Day Smoker    Packs/day: 0.20    Types: Cigarettes  . Smokeless tobacco: Never Used  . Alcohol use 1.2 oz/week    2 Cans of beer per week     Comment: 1 40oz Fri. and Sat.  . Drug use: Yes    Types: Cocaine     Comment: Used yesterday  . Sexual activity: Yes   Other Topics Concern  . None   Social History Narrative  . None   Additional Social History:   Single, lives alone. Never married. No kids. Not in any  relationship. No support from his family. On disability. No legal issues. No other stressors.   Sleep: Fair  Appetite:  better  Current Medications: Current Facility-Administered Medications  Medication Dose Route Frequency Provider Last Rate Last Dose  . acetaminophen (TYLENOL) tablet 650 mg  650 mg Oral Q4H PRN Charm Rings, NP      . albuterol (PROVENTIL HFA;VENTOLIN HFA) 108 (90 Base) MCG/ACT inhaler 2 puff  2 puff Inhalation Q4H Charm Rings, NP   2 puff at 06/15/16 0800  . alum & mag hydroxide-simeth (MAALOX/MYLANTA) 200-200-20 MG/5ML suspension 30 mL  30 mL Oral Q6H PRN Kerry Hough, PA-C   30 mL at  06/14/16 2226  . cloNIDine (CATAPRES) tablet 0.1 mg  0.1 mg Oral BH-qamhs Sanjuana Kava, NP   0.1 mg at 06/15/16 0816   Followed by  . [START ON 06/17/2016] cloNIDine (CATAPRES) tablet 0.1 mg  0.1 mg Oral QAC breakfast Sanjuana Kava, NP      . dicyclomine (BENTYL) tablet 20 mg  20 mg Oral Q6H PRN Sanjuana Kava, NP      . feeding supplement (ENSURE ENLIVE) (ENSURE ENLIVE) liquid 237 mL  237 mL Oral BID BM Sanjuana Kava, NP   237 mL at 06/15/16 0818  . FLUoxetine (PROZAC) capsule 20 mg  20 mg Oral Daily Georgiann Cocker, MD   20 mg at 06/15/16 0811  . gabapentin (NEURONTIN) capsule 200 mg  200 mg Oral BID Charm Rings, NP   200 mg at 06/15/16 4098  . hydrochlorothiazide (HYDRODIURIL) tablet 25 mg  25 mg Oral Daily Charm Rings, NP   25 mg at 06/15/16 0813  . hydrOXYzine (ATARAX/VISTARIL) tablet 25 mg  25 mg Oral Q6H PRN Sanjuana Kava, NP      . levofloxacin (LEVAQUIN) tablet 750 mg  750 mg Oral Daily Charm Rings, NP   750 mg at 06/15/16 0811  . lisinopril (PRINIVIL,ZESTRIL) tablet 20 mg  20 mg Oral Daily Charm Rings, NP   20 mg at 06/15/16 0813  . loperamide (IMODIUM) capsule 2-4 mg  2-4 mg Oral PRN Sanjuana Kava, NP      . magnesium hydroxide (MILK OF MAGNESIA) suspension 30 mL  30 mL Oral Daily PRN Charm Rings, NP      . methocarbamol (ROBAXIN) tablet 500 mg  500 mg Oral Q8H PRN Sanjuana Kava, NP   500 mg at 06/14/16 2158  . naproxen (NAPROSYN) tablet 500 mg  500 mg Oral BID PRN Sanjuana Kava, NP      . nicotine (NICODERM CQ - dosed in mg/24 hours) patch 21 mg  21 mg Transdermal Daily Sanjuana Kava, NP   21 mg at 06/14/16 0851  . ondansetron (ZOFRAN) tablet 4 mg  4 mg Oral Q8H PRN Charm Rings, NP      . risperiDONE (RISPERDAL) tablet 0.5 mg  0.5 mg Oral BID Georgiann Cocker, MD   0.5 mg at 06/15/16 1191    Lab Results: No results found for this or any previous visit (from the past 48 hour(s)).  Blood Alcohol level:  Lab Results  Component Value Date   [facility] <5 06/11/2016    ETH <11 11/17/2011    Metabolic Disorder Labs: No results found for: HGBA1C, MPG No results found for: PROLACTIN Lab Results  Component Value Date   CHOL 209 (H) 07/15/2008   TRIG 106 07/15/2008   HDL 89 07/15/2008   CHOLHDL 2.3  Ratio 07/15/2008   VLDL 21 07/15/2008   LDLCALC 99 07/15/2008    Physical Findings: AIMS: Facial and Oral Movements Muscles of Facial Expression: None, normal Lips and Perioral Area: None, normal Jaw: None, normal Tongue: None, normal,Extremity Movements Upper (arms, wrists, hands, fingers): None, normal Lower (legs, knees, ankles, toes): None, normal, Trunk Movements Neck, shoulders, hips: None, normal, Overall Severity Severity of abnormal movements (highest score from questions above): None, normal Incapacitation due to abnormal movements: None, normal Patient's awareness of abnormal movements (rate only patient's report): No Awareness, Dental Status Current problems with teeth and/or dentures?: No Does patient usually wear dentures?: No  CIWA:  CIWA-Ar Total: 1 COWS:  COWS Total Score: 1  Musculoskeletal: Strength & Muscle Tone: within normal limits Gait & Station: normal Patient leans: N/A  Psychiatric Specialty Exam: Physical Exam  Constitutional: He is oriented to person, place, and time. He appears well-developed and well-nourished.  HENT:  Head: Normocephalic and atraumatic.  Eyes: Conjunctivae and EOM are normal. Pupils are equal, round, and reactive to light.  Neck: Normal range of motion.  Cardiovascular: Normal rate, regular rhythm and normal heart sounds.   Respiratory: Effort normal and breath sounds normal.  GI: Soft. Bowel sounds are normal.  Musculoskeletal: Normal range of motion.  Neurological: He is alert and oriented to person, place, and time. He has normal reflexes.  Skin: Skin is warm and dry.  Psychiatric:  As above.     ROS  Blood pressure (!) 136/99, pulse 94, temperature 98.3 F (36.8 C), temperature source  Oral, resp. rate 16, height  (1.651 m), weight 47.6 kg (105 lb), SpO2 96 %.Body mass index is 17.47 kg/m.  General Appearance:  Neat, in hospital clothing. Pleasant and engaging well. Normal psychomotor activity. Mobilizing affect appropriately    Eye Contact:  Good  Speech:  Spontaneous. Normal tone and rate   Volume:  Normal   Mood:  Feels better  Affect:  Restricted mostly but able to mobilize positive affect.    Thought Process:  Linear  Orientation:  Full (Time, Place, and Person)  Thought Content:  Future oriented. No delusional theme. No preoccupation with violent thoughts. No negative ruminations. No obsession.  No hallucination in any modality.   Suicidal Thoughts:  No  Homicidal Thoughts:  No  Memory:  WNL  Judgement:  Better  Insight:  Good  Psychomotor Activity:  Normal   Concentration:  Good  Recall:  Good  Fund of Knowledge:  Good  Language:  Good  Akathisia:  No  Handed:    AIMS (if indicated):     Assets:  Desire for Improvement Financial Resources/Insurance Housing  ADL's:  Impaired  Cognition:  WNL  Sleep:  Number of Hours: 6.75     Treatment Plan Summary: Patient is still coming off psychoactive substances. He is engaging in a constructive manner. We hope to transition patient into inpatient addiction treatment from here.   Psychiatric: Substance Induced Psychotic Disorder  Medical: Pneumonia HTN Seizure disorder  Psychosocial:  Lives alone No supportive relationship.   PLAN: 1. Continue current regimen 2. Continue to monitor mood, behavior and interaction with peers.   Georgiann Cocker, MD 06/15/2016, 2:08 PMPatient ID: Brian Lucero, male   DOB: 1970/09/19, 46 y.o.   MRN: [id number]

## 2016-06-15 NOTE — Progress Notes (Signed)
D   Pt mostly isolates to himself but he did attend wrap up group this evening     Pt refused his HS medications    He has no complaints or requests    He does appear depressed and sad    He endorses voices but does not appear to be responding to internal stimuli  His interactions are minimal and he has some irritability  A    Verbal support given   Medications offered    Q 15 min checks R    Pt is safe at present

## 2016-06-15 NOTE — [facility] Group Notes (Signed)
[facility] LCSW Group Therapy  06/15/2016 12:00 PM  Type of Therapy:  Group Therapy  Participation Level:  Active  Participation Quality:  Attentive  Affect:  Appropriate  Cognitive:  Alert and Oriented  Insight:  Improving  Engagement in Therapy:  Improving  Modes of Intervention:  Confrontation, Discussion, Education, Problem-solving, Socialization and Support  Summary of Progress/Problems: Self Sabotage: Group Members were asked to identify the meaning of self sabotage and how this applies in their lives. Group members were asked to explore what types of self sabotage are getting in their way and processed plans for change in order to address these obstacles.   Brian Lucero N Smart LCSW 06/15/2016, 12:00 PM

## 2016-06-15 NOTE — [facility] Suicide Risk Assessment (Signed)
[facility] INPATIENT:  Family/Significant Other Suicide Prevention Education  Suicide Prevention Education:  Patient Refusal for Family/Significant Other Suicide Prevention Education: The patient Brian Lucero has refused to provide written consent for family/significant other to be provided Family/Significant Other Suicide Prevention Education during admission and/or prior to discharge.  Physician notified.  SPE completed with pt, as pt refused to consent to family contact. SPI pamphlet provided to pt and pt was encouraged to share information with support network, ask questions, and talk about any concerns relating to SPE. Pt denies access to guns/firearms and verbalized understanding of information provided. Mobile Crisis information also provided to pt.   Nai Dasch N Smart LCSW 06/15/2016, 11:44 AM

## 2016-06-16 NOTE — Progress Notes (Signed)
[facility] MD Progress Note  06/16/2016 2:33 PM Brian Lucero  MRN:  [id number] Subjective:   46 yo AAF presented to the ER via EMS. Background history of SUD. Reports suicidal thoughts and hallucinations. Very irritable and easily agitated. UDS was positive for cocaine and opiates. Malodorous and not willing to engage.  Nursing staff reports that he has been pleasant and cooperative. He has been participating well at unit groups and activities. No behavioral issues. Has not required any PRN lately. Tolerating his medications well. Has not been observed to be internally stimulated.   Seen today. Chart reviewed prior to interview. In good spirits. Says he has been feeling good today. He out at the court yard earlier. He is looking forward to being out again. Not feeling irritable any more. No hallucination in any modality. No thoughts of violence. No suicidal or homicidal thoughts. He is still committed to inpatient rehab.    Principal Problem: Substance Induced Psychotic Disorder Diagnosis:   Patient Active Problem List   Diagnosis Date Noted  . 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 Major depressive disorder, recurrent episode, severe, with psychosis (HCC) [F33.3] 06/12/2016  . Cocaine use disorder, severe, dependence (HCC) [F14.20] 06/12/2016  . Major depressive disorder, recurrent severe without psychotic features (HCC) [F33.2] 06/12/2016  . Seizure (HCC) [R56.9] 11/17/2011  . Tobacco abuse [Z72.0] 11/17/2011  . Alcohol abuse [F10.10] 11/17/2011  . Cocaine abuse [F14.10] 11/17/2011  . Bipolar 2 disorder (HCC) [F31.81] 11/17/2011  . Hypertension [I10] 11/17/2011  . Hydrocephalus [G91.9] 11/17/2011  . Aspiration pneumonia (HCC) [J69.0] 11/17/2011   Total Time spent with patient: 20 minutes  Past Psychiatric History: As in H&P  Past Medical History:  Past Medical History:  Diagnosis Date  . Anxiety   . Bipolar 2 disorder (HCC)   .  Depression   . Hypertension   . Pneumothorax    as a baby in the 46's  . Seizures (HCC)    History reviewed. No pertinent surgical history. Family History:  Family History  Problem Relation Age of Onset  . Diabetes type II Mother   . Diabetes type II     Family Psychiatric  History: As in H&P Social History:  History  Alcohol Use  . 1.2 oz/week  . 2 Cans of beer per week    Comment: 1 40oz Fri. and Sat.     History  Drug Use  . Types: Cocaine    Comment: Used yesterday    Social History   Social History  . Marital status: Single    Spouse name: N/A  . Number of children: N/A  . Years of education: N/A   Social History Main Topics  . Smoking status: Current Every Day Smoker    Packs/day: 0.20    Types: Cigarettes  . Smokeless tobacco: Never Used  . Alcohol use 1.2 oz/week    2 Cans of beer per week     Comment: 1 40oz Fri. and Sat.  . Drug use: Yes    Types: Cocaine     Comment: Used yesterday  . Sexual activity: Yes   Other Topics Concern  . None   Social History Narrative  . None   Additional Social History:   Single, lives alone. Never married. No kids. Not in any relationship. No support from his family. On disability. No legal issues. No other stressors.   Sleep: Fair  Appetite:  better  Current Medications: Current Facility-Administered Medications  Medication Dose Route Frequency Provider Last  Rate Last Dose  . acetaminophen (TYLENOL) tablet 650 mg  650 mg Oral Q4H PRN Charm Rings, NP      . albuterol (PROVENTIL HFA;VENTOLIN HFA) 108 (90 Base) MCG/ACT inhaler 2 puff  2 puff Inhalation Q4H Charm Rings, NP   2 puff at 06/15/16 1537  . alum & mag hydroxide-simeth (MAALOX/MYLANTA) 200-200-20 MG/5ML suspension 30 mL  30 mL Oral Q6H PRN Kerry Hough, PA-C   30 mL at 06/14/16 2226  . cloNIDine (CATAPRES) tablet 0.1 mg  0.1 mg Oral BH-qamhs Sanjuana Kava, NP   0.1 mg at 06/16/16 4098   Followed by  . [START ON 06/17/2016] cloNIDine (CATAPRES)  tablet 0.1 mg  0.1 mg Oral QAC breakfast Sanjuana Kava, NP      . dicyclomine (BENTYL) tablet 20 mg  20 mg Oral Q6H PRN Sanjuana Kava, NP      . feeding supplement (ENSURE ENLIVE) (ENSURE ENLIVE) liquid 237 mL  237 mL Oral BID BM Sanjuana Kava, NP   237 mL at 06/16/16 1346  . FLUoxetine (PROZAC) capsule 20 mg  20 mg Oral Daily Georgiann Cocker, MD   20 mg at 06/16/16 0827  . gabapentin (NEURONTIN) capsule 200 mg  200 mg Oral BID Charm Rings, NP   200 mg at 06/16/16 1191  . hydrochlorothiazide (HYDRODIURIL) tablet 25 mg  25 mg Oral Daily Charm Rings, NP   25 mg at 06/16/16 0827  . hydrOXYzine (ATARAX/VISTARIL) tablet 25 mg  25 mg Oral Q6H PRN Sanjuana Kava, NP      . lisinopril (PRINIVIL,ZESTRIL) tablet 20 mg  20 mg Oral Daily Charm Rings, NP   20 mg at 06/16/16 4782  . loperamide (IMODIUM) capsule 2-4 mg  2-4 mg Oral PRN Sanjuana Kava, NP      . magnesium hydroxide (MILK OF MAGNESIA) suspension 30 mL  30 mL Oral Daily PRN Charm Rings, NP      . methocarbamol (ROBAXIN) tablet 500 mg  500 mg Oral Q8H PRN Sanjuana Kava, NP   500 mg at 06/14/16 2158  . naproxen (NAPROSYN) tablet 500 mg  500 mg Oral BID PRN Sanjuana Kava, NP      . nicotine (NICODERM CQ - dosed in mg/24 hours) patch 21 mg  21 mg Transdermal Daily Sanjuana Kava, NP   21 mg at 06/16/16 0829  . ondansetron (ZOFRAN) tablet 4 mg  4 mg Oral Q8H PRN Charm Rings, NP      . risperiDONE (RISPERDAL) tablet 0.5 mg  0.5 mg Oral BID Georgiann Cocker, MD   0.5 mg at 06/16/16 9562    Lab Results: No results found for this or any previous visit (from the past 48 hour(s)).  Blood Alcohol level:  Lab Results  Component Value Date   ETH <5 06/11/2016   ETH <11 11/17/2011    Metabolic Disorder Labs: No results found for: HGBA1C, MPG No results found for: PROLACTIN Lab Results  Component Value Date   CHOL 209 (H) 07/15/2008   TRIG 106 07/15/2008   HDL 89 07/15/2008   CHOLHDL 2.3 Ratio 07/15/2008   VLDL 21 07/15/2008    LDLCALC 99 07/15/2008    Physical Findings: AIMS: Facial and Oral Movements Muscles of Facial Expression: None, normal Lips and Perioral Area: None, normal Jaw: None, normal Tongue: None, normal,Extremity Movements Upper (arms, wrists, hands, fingers): None, normal Lower (legs, knees, ankles, toes): None, normal, Trunk Movements Neck,  shoulders, hips: None, normal, Overall Severity Severity of abnormal movements (highest score from questions above): None, normal Incapacitation due to abnormal movements: None, normal Patient's awareness of abnormal movements (rate only patient's report): No Awareness, Dental Status Current problems with teeth and/or dentures?: No Does patient usually wear dentures?: No  CIWA:  CIWA-Ar Total: 1 COWS:  COWS Total Score: 1  Musculoskeletal: Strength & Muscle Tone: within normal limits Gait & Station: normal Patient leans: N/A  Psychiatric Specialty Exam: Physical Exam  Constitutional: He is oriented to person, place, and time. He appears well-developed and well-nourished.  HENT:  Head: Normocephalic and atraumatic.  Eyes: Conjunctivae and EOM are normal. Pupils are equal, round, and reactive to light.  Neck: Normal range of motion.  Cardiovascular: Normal rate, regular rhythm and normal heart sounds.   Respiratory: Effort normal and breath sounds normal.  GI: Soft. Bowel sounds are normal.  Musculoskeletal: Normal range of motion.  Neurological: He is alert and oriented to person, place, and time. He has normal reflexes.  Skin: Skin is warm and dry.  Psychiatric:  As above.     ROS  Blood pressure (!) 148/112, pulse (!) 105, temperature 98.5 F (36.9 C), temperature source Oral, resp. rate 18, height  (1.651 m), weight 47.6 kg (105 lb), SpO2 96 %.Body mass index is 17.47 kg/m.  General Appearance:  Neatly dressed. Has own clothing now.  Pleasant and engaging well. Normal psychomotor activity. Good relatedness. Appropriate behavior. Not  internally distracted.   Eye Contact:  Good  Speech:  Spontaneous. Normal tone and rate   Volume:  Normal   Mood:  Euthymic  Affect:  Full range and appropriate     Thought Process:  Linear  Orientation:  Full (Time, Place, and Person)  Thought Content:  Future oriented. No delusional theme. No preoccupation with violent thoughts. No negative ruminations. No obsession.  No hallucination in any modality.   Suicidal Thoughts:  No  Homicidal Thoughts:  No  Memory:  WNL  Judgement:  Good  Insight:  Good  Psychomotor Activity:  Normal   Concentration:  Good  Recall:  Good  Fund of Knowledge:  Good  Language:  Good  Akathisia:  No  Handed:    AIMS (if indicated):     Assets:  Desire for Improvement Financial Resources/Insurance Housing  ADL's:  Impaired  Cognition:  WNL  Sleep:  Number of Hours: 6     Treatment Plan Summary: Patient is at baseline. He is not a danger to self or others. He has been accepted into inpatient rehab. Bed is available on Wednesday. Patient feels he would relapse if he is in an unsupervised setting.  Psychiatric: Substance Induced Psychotic Disorder  Medical: Pneumonia HTN Seizure disorder  Psychosocial:  Lives alone No supportive relationship.   PLAN: 1. Continue current regimen 2. Continue to monitor mood, behavior and interaction with peers.   Georgiann Cocker, MD 06/16/2016, 2:33 PMPatient ID: Brian Lucero, male   DOB: [birth year], 46 y.o.   MRN: [id number] Patient ID: Brian Lucero, male   DOB: 1970-09-04, 46 y.o.   MRN: [id number]

## 2016-06-16 NOTE — Progress Notes (Signed)
Recreation Therapy Notes  Date: 06/16/16 Time: 0930 Location: [address] Dayroom  Group Topic: Stress Management  Goal Area(s) Addresses:  Patient will verbalize importance of using healthy stress management.  Patient will identify positive emotions associated with healthy stress management.   Behavioral Response: Engaged  Intervention: Stress Management  Activity :  [place].  LRT introduced the stress management technique of guided imagery.  LRT read a script to allow patients to engage in the activity.  Patients were to follow along as LRT read the script to participate in activity.  Education:  Stress Management, Discharge Planning.   Education Outcome: Acknowledges edcuation/In group clarification offered/Needs additional education  Clinical Observations/Feedback: Pt attended group.   Caroll Rancher, LRT/CTRS         Caroll Rancher A 06/16/2016 11:53 AM

## 2016-06-16 NOTE — Progress Notes (Signed)
DAR NOTE: Patient presents with bright affect and jovial mood.  Denies pain, auditory and visual hallucinations.  Rates depression at 4, hopelessness at 4, and anxiety at 6.  Maintained on routine safety checks.  Medications given as prescribed.  Support and encouragement offered as needed.  Attended group and participated.  States goal for today is " trying to stay in a positive mood today."  Patient observed socializing with peers in the dayroom.  Offered no complaint.

## 2016-06-16 NOTE — [facility] Group Notes (Signed)
[facility] LCSW Group Therapy  06/16/2016 3:04 PM  Type of Therapy:  Group Therapy  Participation Level:  Active  Participation Quality:  Attentive  Affect:  Appropriate  Cognitive:  Alert and Oriented  Insight:  Improving  Engagement in Therapy:  Improving  Modes of Intervention:  Confrontation, Discussion, Education, Problem-solving, Socialization and Support  Summary of Progress/Problems: Feelings around Relapse. Group members discussed the meaning of relapse and shared personal stories of relapse, how it affected them and others, and how they perceived themselves during this time. Group members were encouraged to identify triggers, warning signs and coping skills used when facing the possibility of relapse. Social supports were discussed and explored in detail.   Anasia Agro N Smart LCSW 06/16/2016, 3:04 PM

## 2016-06-16 NOTE — Progress Notes (Signed)
D   Pt is pleasant on approach   He smiles and is jovial  He is interacting well with others    He got a little SOB and used his inhaler   He said he likes to use it PRN and not scheduled as using it more often makes the roof of his mouth burn A    Verbal support given   Medications administered and effectiveness monitored  Q 15 min checks R    Pt safe at present

## 2016-06-16 NOTE — Tx Team (Signed)
Interdisciplinary Treatment and Diagnostic Plan Update  06/16/2016 Time of Session: 0930 Brian Lucero MRN: [id number]  Principal Diagnosis: MDD  Secondary Diagnoses: Principal Problem:   Substance-induced psychotic disorder with hallucinations (HCC) Active Problems:   Major depressive disorder, recurrent severe without psychotic features (HCC)   Current Medications:  Current Facility-Administered Medications  Medication Dose Route Frequency Provider Last Rate Last Dose  . acetaminophen (TYLENOL) tablet 650 mg  650 mg Oral Q4H PRN Charm Rings, NP      . albuterol (PROVENTIL HFA;VENTOLIN HFA) 108 (90 Base) MCG/ACT inhaler 2 puff  2 puff Inhalation Q4H Charm Rings, NP   2 puff at 06/15/16 1537  . alum & mag hydroxide-simeth (MAALOX/MYLANTA) 200-200-20 MG/5ML suspension 30 mL  30 mL Oral Q6H PRN Kerry Hough, PA-C   30 mL at 06/14/16 2226  . cloNIDine (CATAPRES) tablet 0.1 mg  0.1 mg Oral BH-qamhs Sanjuana Kava, NP   0.1 mg at 06/16/16 4098   Followed by  . [START ON 06/17/2016] cloNIDine (CATAPRES) tablet 0.1 mg  0.1 mg Oral QAC breakfast Sanjuana Kava, NP      . dicyclomine (BENTYL) tablet 20 mg  20 mg Oral Q6H PRN Sanjuana Kava, NP      . feeding supplement (ENSURE ENLIVE) (ENSURE ENLIVE) liquid 237 mL  237 mL Oral BID BM Sanjuana Kava, NP   237 mL at 06/16/16 1346  . FLUoxetine (PROZAC) capsule 20 mg  20 mg Oral Daily Georgiann Cocker, MD   20 mg at 06/16/16 0827  . gabapentin (NEURONTIN) capsule 200 mg  200 mg Oral BID Charm Rings, NP   200 mg at 06/16/16 1191  . hydrochlorothiazide (HYDRODIURIL) tablet 25 mg  25 mg Oral Daily Charm Rings, NP   25 mg at 06/16/16 0827  . hydrOXYzine (ATARAX/VISTARIL) tablet 25 mg  25 mg Oral Q6H PRN Sanjuana Kava, NP      . lisinopril (PRINIVIL,ZESTRIL) tablet 20 mg  20 mg Oral Daily Charm Rings, NP   20 mg at 06/16/16 4782  . loperamide (IMODIUM) capsule 2-4 mg  2-4 mg Oral PRN Sanjuana Kava, NP      . magnesium hydroxide (MILK OF  MAGNESIA) suspension 30 mL  30 mL Oral Daily PRN Charm Rings, NP      . methocarbamol (ROBAXIN) tablet 500 mg  500 mg Oral Q8H PRN Sanjuana Kava, NP   500 mg at 06/14/16 2158  . naproxen (NAPROSYN) tablet 500 mg  500 mg Oral BID PRN Sanjuana Kava, NP      . nicotine (NICODERM CQ - dosed in mg/24 hours) patch 21 mg  21 mg Transdermal Daily Sanjuana Kava, NP   21 mg at 06/16/16 0829  . ondansetron (ZOFRAN) tablet 4 mg  4 mg Oral Q8H PRN Charm Rings, NP      . risperiDONE (RISPERDAL) tablet 0.5 mg  0.5 mg Oral BID Georgiann Cocker, MD   0.5 mg at 06/16/16 9562   PTA Medications: Prescriptions Prior to Admission  Medication Sig Dispense Refill Last Dose  . divalproex (DEPAKOTE) 500 MG DR tablet Take 500 mg by mouth 2 (two) times daily.   Past Week at Unknown time  . hydrochlorothiazide (HYDRODIURIL) 25 MG tablet Take 1 tablet (25 mg total) by mouth daily. 30 tablet 0 Past Week at Unknown time  . levofloxacin (LEVAQUIN) 750 MG tablet Take 1 tablet (750 mg total) by mouth daily. X 7  days 6 tablet 0   . lisinopril (PRINIVIL,ZESTRIL) 20 MG tablet Take 1 tablet (20 mg total) by mouth daily. 30 tablet 0 Past Week at Unknown time  . sertraline (ZOLOFT) 100 MG tablet Take 100 mg by mouth 3 (three) times daily.   Past Week at Unknown time    Patient Stressors: Substance abuse  Patient Strengths: Wellsite geologist fund of knowledge  Treatment Modalities: Medication Management, Group therapy, Case management,  1 to 1 session with clinician, Psychoeducation, Recreational therapy.   Physician Treatment Plan for Primary Diagnosis: MDD  Medication Management: Evaluate patient's response, side effects, and tolerance of medication regimen.  Therapeutic Interventions: 1 to 1 sessions, Unit Group sessions and Medication administration.  Evaluation of Outcomes: Progressing  Physician Treatment Plan for Secondary Diagnosis: Principal Problem:   Substance-induced psychotic disorder with  hallucinations (HCC) Active Problems:   Major depressive disorder, recurrent severe without psychotic features (HCC)  Long Term Goal(s): Improvement in symptoms so as ready for discharge Improvement in symptoms so as ready for discharge   Short Term Goals: Ability to identify changes in lifestyle to reduce recurrence of condition will improve Ability to verbalize feelings will improve Ability to disclose and discuss suicidal ideas Ability to demonstrate self-control will improve Ability to identify and develop effective coping behaviors will improve Compliance with prescribed medications will improve Ability to identify triggers associated with substance abuse/mental health issues will improve     Medication Management: Evaluate patient's response, side effects, and tolerance of medication regimen.  Therapeutic Interventions: 1 to 1 sessions, Unit Group sessions and Medication administration.  Evaluation of Outcomes: Progressing   RN Treatment Plan for Primary Diagnosis: MDD Long Term Goal(s): Knowledge of disease and therapeutic regimen to maintain health will improve  Short Term Goals: Ability to remain free from injury will improve, Ability to verbalize feelings will improve and Ability to disclose and discuss suicidal ideas  Medication Management: RN will administer medications as ordered by provider, will assess and evaluate patient's response and provide education to patient for prescribed medication. RN will report any adverse and/or side effects to prescribing provider.  Therapeutic Interventions: 1 on 1 counseling sessions, Psychoeducation, Medication administration, Evaluate responses to treatment, Monitor vital signs and CBGs as ordered, Perform/monitor CIWA, COWS, AIMS and Fall Risk screenings as ordered, Perform wound care treatments as ordered.  Evaluation of Outcomes: Progressing   LCSW Treatment Plan for Primary Diagnosis: MDD Long Term Goal(s): Safe transition to  appropriate next level of care at discharge, Engage patient in therapeutic group addressing interpersonal concerns.  Short Term Goals: Engage patient in aftercare planning with referrals and resources, Facilitate patient progression through stages of change regarding substance use diagnoses and concerns and Identify triggers associated with mental health/substance abuse issues  Therapeutic Interventions: Assess for all discharge needs, 1 to 1 time with Social worker, Explore available resources and support systems, Assess for adequacy in community support network, Educate family and significant other(s) on suicide prevention, Complete Psychosocial Assessment, Interpersonal group therapy.  Evaluation of Outcomes: Progressing   Progress in Treatment: Attending groups: Yes. Participating in groups: Yes. Taking medication as prescribed: Yes. Toleration medication: Yes. Family/Significant other contact made: SPE completed with pt; pt declines to consent to family contact.  Patient understands diagnosis: Yes. Discussing patient identified problems/goals with staff: Yes. Medical problems stabilized or resolved: Yes. Denies suicidal/homicidal ideation: yes, per self report.  Issues/concerns per patient self-inventory: No. Other: n/a  New problem(s) identified: No, Describe:  n/a  New Short Term/Long Term Goal(s):  detox; elimination of AH/SI, medication stabilization; development of comprehensive mental wellness/sobriety plan.   Discharge Plan or Barriers: Pt has a screening for possible admission scheduled on Wed at 7:45AM. [facility] for outpatient services.   Reason for Continuation of Hospitalization: Depression Hallucinations Medication stabilization Withdrawal symptoms  Estimated Length of Stay: 3-4 days (likely discharge on Wed morning).  Attendees: Patient: 06/16/2016 3:05 PM  Physician: Dr. Jackquline Berlin MD 06/16/2016 3:05 PM  Nursing: Louis Matte RN 06/16/2016 3:05 PM  RN Care Manager:  Onnie Boer CM 06/16/2016 3:05 PM  Social Worker: [name], LCSW 06/16/2016 3:05 PM  Recreational Therapist: Juliann Pares 06/16/2016 3:05 PM  Other: Armandina Stammer NP 06/16/2016 3:05 PM  Other:  06/16/2016 3:05 PM  Other: 06/16/2016 3:05 PM    Scribe for Treatment Team: Ledell Peoples Smart, LCSW 06/16/2016 3:05 PM

## 2016-06-16 NOTE — Progress Notes (Signed)
Date:  06/16/2016 Time:  1:09 AM  Group Topic/Focus:  Wrap-Up Group:   The focus of this group is to help patients review their daily goal of treatment and discuss progress on daily workbooks.  Participation Level:  Active  Participation Quality:  Appropriate and Attentive  Affect:  Appropriate  Cognitive:  Appropriate  Insight: Appropriate  Engagement in Group:  Engaged  Modes of Intervention:  Discussion  Additional Comments:  Pt stated his goal was to work on his discharge plan. Pt stated he will be leaving here to go to [facility] on Wednesday morning.Pt stated one positive thing that happened today is that he gets to leave straight from here to go to rehab instead of going home first.  Brian Lucero 06/16/2016, 1:09 AM

## 2016-06-16 NOTE — Progress Notes (Signed)
Pt attended the evening AA speaker meeting. Pt was engaged and appropriate. Brian Lucero C, NT 06/16/16 10:00 PM  

## 2016-06-16 NOTE — [facility] Group Notes (Signed)
Pt participated in Psychoeducational group. Pt discussed their relapse prevention plan. During this group pt's discussed behaviors they would like to change. Pt's discussed their triggers, warning signs of relapse behavior, and coping skills they can use to prevent relapse.  

## 2016-06-16 NOTE — Progress Notes (Signed)
Pt more social and receptive to conversation and socialization this morning   He was very pleasant to talk to   He said he didn't sleep the best last night because his roommate snores and even though he had been given ear plugs they fell out and were not effective   He said when the patient rolled to his side he stopped snoring and pt was able to go back to sleep

## 2016-06-17 DIAGNOSIS — I10 Essential (primary) hypertension: Secondary | ICD-10-CM

## 2016-06-17 DIAGNOSIS — R569 Unspecified convulsions: Secondary | ICD-10-CM

## 2016-06-17 MED ORDER — AMLODIPINE BESYLATE 5 MG PO TABS
5.0000 mg | ORAL_TABLET | Freq: Every day | ORAL | Status: DC
  Administered 2016-06-18 – 2016-06-20 (×3): 5 mg via ORAL
  Filled 2016-06-17 (×3): qty 1
  Filled 2016-06-17: qty 14
  Filled 2016-06-17 (×2): qty 1

## 2016-06-17 NOTE — Plan of Care (Signed)
Problem: Education: Goal: Knowledge of the prescribed therapeutic regimen will improve Outcome: Progressing PAtient has been taking his medications, when requested to, but does not initiate administration.

## 2016-06-17 NOTE — Progress Notes (Signed)
[facility] Group Notes:  (Nursing/MHT/Case Management/Adjunct)  Date:  06/17/2016  Time:  2100   Type of Therapy:  wrap up group  Participation Level:  Active  Participation Quality:  Appropriate, Attentive, Sharing and Supportive  Affect:  Appropriate  Cognitive:  Appropriate  Insight:  Improving  Engagement in Group:  Engaged  Modes of Intervention:  Clarification, Education and Support  Summary of Progress/Problems: Pt reports his mental and emotional health have been very poor. Pt shares that "I can't be by myself, it isnt good for me"  Pt wants to go to treatment at [facility] and them maybe move into an House house. Pt reports having the desire not to use.   Marcille Buffy 06/17/2016, 10:34 PM

## 2016-06-17 NOTE — Progress Notes (Signed)
D   Pt is pleasant on approach   He smiles and is jovial  He is interacting well with others    He attended group and interacts well with others A    Verbal support given   Medications administered and effectiveness monitored  Q 15 min checks R    Pt safe at present

## 2016-06-17 NOTE — [facility] Group Notes (Signed)
Adult Therapy Group Note (Clinical Social Work)  Date:  06/17/2016  Time:  11:00AM-12:00PM  Group Topic/Focus: Unhealthy vs Healthy Coping Techniques  Building Self Esteem:    The focus of this group was to determine what unhealthy coping techniques typically are used by various patients in the room and what healthy coping techniques would be more helpful instead.  The whiteboard was used to record responses, and a group discussion was then held about the reasons people tend to use the unhealthy skills more often and more readily than the healthy ones.  The effort that would be needed to learn and to use healthier coping techniques was discussed with an emphasis on the potential positive outcome.  Participation Level:  Active  Participation Quality:  Appropriate, Attentive and Sharing  Affect:  Blunted and Depressed  Cognitive:  Alert and Appropriate  Insight: Good  Engagement in Group:  Engaged  Modes of Intervention:  Exercise, Discussion and Support  Additional Comments:  The patient expressed himself freely throughout group until about 10 minutes before the end, when he put his head down and seemed to "check out" prior to actually leaving the room.  Until then, he was showing significant insight into the difficulty that can be faced in learning new coping skills, as well as a willingness to do what is necessary to have a positive change in is life.  Brian Mantle, LCSW 06/17/2016   1:08 PM

## 2016-06-17 NOTE — Progress Notes (Signed)
[facility] MD Progress Note  06/17/2016 1:11 PM Brian Lucero  MRN:  [id number] Subjective:   46 yo AAF presented to the ER via EMS. Background history of SUD. Reports suicidal thoughts and hallucinations. Very irritable and easily agitated. UDS was positive for cocaine and opiates. Malodorous and not willing to engage.  Per nursing: Pt went to bed and forgot to come get his medications   He was already asleep so he didn't get his clonidine at HS last night   His BP was high this morning so he received his dose that he missed last night   Pt reports no symptoms and said he slept well   He is pleasant on approach this morning   Seen today. Chart reviewed prior to interview. He reports that he is very anxious at this time and worry about discharge an this future. He currently rates his depression and anxiety 6/10 with 0/10 being the least and 10 being the worse. He reports that the suicidal thoughts came back yesterday ( leaving here  and walking out in front of traffic [place]. I was over thinking those thoughts and that is what I was going to do. )  He continues to ruminate about being alone " by myself". He is encouraged to get out and be more active. In good spirits. He states he has a problem trusting people which contributes to his poor relationships. No hallucination in any modality. No thoughts of violence. No suicidal or homicidal thoughts. He is still committed to inpatient rehab.    Principal Problem: Substance Induced Psychotic Disorder Diagnosis:   Patient Active Problem List   Diagnosis Date Noted  . 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 Major depressive disorder, recurrent episode, severe, with psychosis (HCC) [F33.3] 06/12/2016  . Cocaine use disorder, severe, dependence (HCC) [F14.20] 06/12/2016  . Major depressive disorder, recurrent severe without psychotic features (HCC) [F33.2] 06/12/2016  . Seizure (HCC) [R56.9] 11/17/2011  .  Tobacco abuse [Z72.0] 11/17/2011  . Alcohol abuse [F10.10] 11/17/2011  . Cocaine abuse [F14.10] 11/17/2011  . Bipolar 2 disorder (HCC) [F31.81] 11/17/2011  . Hypertension [I10] 11/17/2011  . Hydrocephalus [G91.9] 11/17/2011  . Aspiration pneumonia (HCC) [J69.0] 11/17/2011   Total Time spent with patient: 20 minutes  Past Psychiatric History: As in H&P  Past Medical History:  Past Medical History:  Diagnosis Date  . Anxiety   . Bipolar 2 disorder (HCC)   . Depression   . Hypertension   . Pneumothorax    as a baby in the 11's  . Seizures (HCC)    History reviewed. No pertinent surgical history. Family History:  Family History  Problem Relation Age of Onset  . Diabetes type II Mother   . Diabetes type II     Family Psychiatric  History: As in H&P Social History:  History  Alcohol Use  . 1.2 oz/week  . 2 Cans of beer per week    Comment: 1 40oz Fri. and Sat.     History  Drug Use  . Types: Cocaine    Comment: Used yesterday    Social History   Social History  . Marital status: Single    Spouse name: N/A  . Number of children: N/A  . Years of education: N/A   Social History Main Topics  . Smoking status: Current Every Day Smoker    Packs/day: 0.20    Types: Cigarettes  . Smokeless tobacco: Never Used  . Alcohol use 1.2 oz/week  2 Cans of beer per week     Comment: 1 40oz Fri. and Sat.  . Drug use: Yes    Types: Cocaine     Comment: Used yesterday  . Sexual activity: Yes   Other Topics Concern  . None   Social History Narrative  . None   Additional Social History:   Single, lives alone. Never married. No kids. Not in any relationship. No support from his family. On disability. No legal issues. No other stressors.   Sleep: Fair  Appetite:  better  Current Medications: Current Facility-Administered Medications  Medication Dose Route Frequency Provider Last Rate Last Dose  . acetaminophen (TYLENOL) tablet 650 mg  650 mg Oral Q4H PRN Charm Rings, NP      . albuterol (PROVENTIL HFA;VENTOLIN HFA) 108 (90 Base) MCG/ACT inhaler 2 puff  2 puff Inhalation Q4H Charm Rings, NP   2 puff at 06/17/16 1056  . alum & mag hydroxide-simeth (MAALOX/MYLANTA) 200-200-20 MG/5ML suspension 30 mL  30 mL Oral Q6H PRN Kerry Hough, PA-C   30 mL at 06/16/16 1859  . cloNIDine (CATAPRES) tablet 0.1 mg  0.1 mg Oral QAC breakfast Sanjuana Kava, NP      . dicyclomine (BENTYL) tablet 20 mg  20 mg Oral Q6H PRN Sanjuana Kava, NP      . feeding supplement (ENSURE ENLIVE) (ENSURE ENLIVE) liquid 237 mL  237 mL Oral BID BM Sanjuana Kava, NP   237 mL at 06/17/16 1053  . FLUoxetine (PROZAC) capsule 20 mg  20 mg Oral Daily Georgiann Cocker, MD   20 mg at 06/17/16 1050  . gabapentin (NEURONTIN) capsule 200 mg  200 mg Oral BID Charm Rings, NP   200 mg at 06/17/16 1050  . hydrochlorothiazide (HYDRODIURIL) tablet 25 mg  25 mg Oral Daily Charm Rings, NP   25 mg at 06/17/16 1050  . hydrOXYzine (ATARAX/VISTARIL) tablet 25 mg  25 mg Oral Q6H PRN Sanjuana Kava, NP      . lisinopril (PRINIVIL,ZESTRIL) tablet 20 mg  20 mg Oral Daily Charm Rings, NP   20 mg at 06/17/16 1052  . loperamide (IMODIUM) capsule 2-4 mg  2-4 mg Oral PRN Sanjuana Kava, NP      . magnesium hydroxide (MILK OF MAGNESIA) suspension 30 mL  30 mL Oral Daily PRN Charm Rings, NP      . methocarbamol (ROBAXIN) tablet 500 mg  500 mg Oral Q8H PRN Sanjuana Kava, NP   500 mg at 06/14/16 2158  . naproxen (NAPROSYN) tablet 500 mg  500 mg Oral BID PRN Sanjuana Kava, NP      . nicotine (NICODERM CQ - dosed in mg/24 hours) patch 21 mg  21 mg Transdermal Daily Sanjuana Kava, NP   21 mg at 06/17/16 1052  . ondansetron (ZOFRAN) tablet 4 mg  4 mg Oral Q8H PRN Charm Rings, NP      . risperiDONE (RISPERDAL) tablet 0.5 mg  0.5 mg Oral BID Georgiann Cocker, MD   0.5 mg at 06/17/16 1052    Lab Results: No results found for this or any previous visit (from the past 48 hour(s)).  Blood Alcohol level:  Lab  Results  Component Value Date   [facility] <5 06/11/2016   ETH <11 11/17/2011    Metabolic Disorder Labs: No results found for: HGBA1C, MPG No results found for: PROLACTIN Lab Results  Component Value Date  CHOL 209 (H) 07/15/2008   TRIG 106 07/15/2008   HDL 89 07/15/2008   CHOLHDL 2.3 Ratio 07/15/2008   VLDL 21 07/15/2008   LDLCALC 99 07/15/2008    Physical Findings: AIMS: Facial and Oral Movements Muscles of Facial Expression: None, normal Lips and Perioral Area: None, normal Jaw: None, normal Tongue: None, normal,Extremity Movements Upper (arms, wrists, hands, fingers): None, normal Lower (legs, knees, ankles, toes): None, normal, Trunk Movements Neck, shoulders, hips: None, normal, Overall Severity Severity of abnormal movements (highest score from questions above): None, normal Incapacitation due to abnormal movements: None, normal Patient's awareness of abnormal movements (rate only patient's report): No Awareness, Dental Status Current problems with teeth and/or dentures?: No Does patient usually wear dentures?: No  CIWA:  CIWA-Ar Total: 1 COWS:  COWS Total Score: 1  Musculoskeletal: Strength & Muscle Tone: within normal limits Gait & Station: normal Patient leans: N/A  Psychiatric Specialty Exam: Physical Exam  Constitutional: He is oriented to person, place, and time. He appears well-developed and well-nourished.  HENT:  Head: Normocephalic and atraumatic.  Eyes: Conjunctivae and EOM are normal. Pupils are equal, round, and reactive to light.  Neck: Normal range of motion.  Cardiovascular: Normal rate, regular rhythm and normal heart sounds.   Respiratory: Effort normal and breath sounds normal.  GI: Soft. Bowel sounds are normal.  Musculoskeletal: Normal range of motion.  Neurological: He is alert and oriented to person, place, and time. He has normal reflexes.  Skin: Skin is warm and dry.  Psychiatric:  As above.     ROS   Blood pressure (!) 144/78,  pulse 91, temperature 98.4 F (36.9 C), temperature source Oral, resp. rate 18, height  (1.651 m), weight 47.6 kg (105 lb), SpO2 96 %.Body mass index is 17.47 kg/m.  General Appearance:  Neatly dressed. Has own clothing now.  Pleasant and engaging well. Normal psychomotor activity. Good relatedness. Appropriate behavior. Not internally distracted.   Eye Contact:  Good  Speech:  Spontaneous. Normal tone and rate   Volume:  Normal   Mood:  Euthymic  Affect:  Full range and appropriate     Thought Process:  Linear  Orientation:  Full (Time, Place, and Person)  Thought Content:  Future oriented. No delusional theme. No preoccupation with violent thoughts. No negative ruminations. No obsession.  No hallucination in any modality.   Suicidal Thoughts:  No  Homicidal Thoughts:  No  Memory:  WNL  Judgement:  Good  Insight:  Good  Psychomotor Activity:  Normal   Concentration:  Good  Recall:  Good  Fund of Knowledge:  Good  Language:  Good  Akathisia:  No  Handed:    AIMS (if indicated):     Assets:  Desire for Improvement Financial Resources/Insurance Housing  ADL's:  Impaired  Cognition:  WNL  Sleep:  Number of Hours: 6     Treatment Plan Summary: Patient is at baseline. He is not a danger to self or others. He has been accepted into inpatient rehab. Bed is available on Wednesday. Patient feels he would relapse if he is in an unsupervised setting.  Psychiatric: Substance Induced Psychotic Disorder  Medical: Pneumonia HTN Seizure disorder  Psychosocial:  Lives alone No supportive relationship.   PLAN: 1. Continue current regimen 2. Continue to monitor mood, behavior and interaction with peers. 3. Consider cardiology referral after discharge to help control BP readings. Will start amlodipine  po daily for hypertension tomorrow 06/18/2016.Truman Hayward, FNP 06/17/2016, 1:11 PM

## 2016-06-17 NOTE — Progress Notes (Signed)
Pt went to bed and forgot to come get his medications   He was already asleep so he didn't get his clonidine at HS last night   His BP was high this morning so he received his dose that he missed last night   Pt reports no symptoms and said he slept well   He is pleasant on approach this morning

## 2016-06-17 NOTE — Progress Notes (Signed)
Data. Patient denies SI/HI/AVH. Patient spent the first of the morning in bed sleeping and took his medications late. When up patient interacting well with staff and other patients. Patient noted sitting in the dayroom, with the male patients in a circle around him and patient laughing and joking with them. Patient stated two two male nurses, "How do I get to have two pretty women looking after me?"  Action. Emotional support and encouragement offered. Education provided on medication, indications and side effect. Q 15 minute checks done for safety. Response. Safety on the unit maintained through 15 minute checks.  Medications taken as prescribed. Attended groups. Remained calm and appropriate through out shift.

## 2016-06-18 LAB — TSH: TSH: 4.11 u[IU]/mL (ref 0.350–4.500)

## 2016-06-18 LAB — LIPID PANEL
Cholesterol: 216 mg/dL — ABNORMAL HIGH (ref 0–200)
HDL: 76 mg/dL (ref >40)
LDL Cholesterol: 131 mg/dL — ABNORMAL HIGH (ref 0–99)
TRIGLYCERIDES: 45 mg/dL (ref <150)
Total CHOL/HDL Ratio: 2.8 RATIO
VLDL: 9 mg/dL (ref 0–40)

## 2016-06-18 MED ORDER — TRAZODONE HCL 50 MG PO TABS
50.0000 mg | ORAL_TABLET | Freq: Every evening | ORAL | Status: DC | PRN
  Administered 2016-06-18 – 2016-06-19 (×2): 50 mg via ORAL
  Filled 2016-06-18 (×9): qty 1

## 2016-06-18 NOTE — Plan of Care (Signed)
Problem: Education: Goal: Utilization of techniques to improve thought processes will improve Outcome: Progressing Nurse discussed depression/anxiety/coping skills with patient.    

## 2016-06-18 NOTE — Plan of Care (Signed)
Problem: Education: Goal: Knowledge of the prescribed therapeutic regimen will improve Outcome: Progressing Nurse discussed depression/anxiety/coping skills with patient.    

## 2016-06-18 NOTE — Progress Notes (Signed)
Patient requested shoes from locker.  NP approved.  Shoe laces removed and left in locker 32.  Patient given tennis shoes per request. Patient stated his feet were hurting walking on these floors without shoes.

## 2016-06-18 NOTE — Progress Notes (Signed)
[facility] MD Progress Note  06/18/2016 3:59 PM Brian Lucero  MRN:  [id number] Subjective:   Patient states that he still has side pain.  Otherwise, he states that his moods are ok but does need help with sleep. Objective:  Patient was admitted with suicidal thoughts and hallucinations.  He is better dressed today and is street clothes.  He is not isolating today but seen in groups, interacting with others and with staff.  He is compliant with meds and is not complaining os any ADR's.    Principal Problem: Substance Induced Psychotic Disorder Diagnosis:   Patient Active Problem List   Diagnosis Date Noted  . 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 Major depressive disorder, recurrent episode, severe, with psychosis (HCC) [F33.3] 06/12/2016  . Cocaine use disorder, severe, dependence (HCC) [F14.20] 06/12/2016  . Major depressive disorder, recurrent severe without psychotic features (HCC) [F33.2] 06/12/2016  . Seizure (HCC) [R56.9] 11/17/2011  . Tobacco abuse [Z72.0] 11/17/2011  . Alcohol abuse [F10.10] 11/17/2011  . Cocaine abuse [F14.10] 11/17/2011  . Bipolar 2 disorder (HCC) [F31.81] 11/17/2011  . Hypertension [I10] 11/17/2011  . Hydrocephalus [G91.9] 11/17/2011  . Aspiration pneumonia (HCC) [J69.0] 11/17/2011   Total Time spent with patient: 20 minutes  Past Psychiatric History: As in H&P  Past Medical History:  Past Medical History:  Diagnosis Date  . Anxiety   . Bipolar 2 disorder (HCC)   . Depression   . Hypertension   . Pneumothorax    as a baby in the 74's  . Seizures (HCC)    History reviewed. No pertinent surgical history. Family History:  Family History  Problem Relation Age of Onset  . Diabetes type II Mother   . Diabetes type II     Family Psychiatric  History: As in H&P Social History:  History  Alcohol Use  . 1.2 oz/week  . 2 Cans of beer per week    Comment: 1 40oz Fri. and Sat.     History  Drug Use  .  Types: Cocaine    Comment: Used yesterday    Social History   Social History  . Marital status: Single    Spouse name: N/A  . Number of children: N/A  . Years of education: N/A   Social History Main Topics  . Smoking status: Current Every Day Smoker    Packs/day: 0.20    Types: Cigarettes  . Smokeless tobacco: Never Used  . Alcohol use 1.2 oz/week    2 Cans of beer per week     Comment: 1 40oz Fri. and Sat.  . Drug use: Yes    Types: Cocaine     Comment: Used yesterday  . Sexual activity: Yes   Other Topics Concern  . None   Social History Narrative  . None   Additional Social History:   Single, lives alone. Never married. No kids. Not in any relationship. No support from his family. On disability. No legal issues. No other stressors.   Sleep: Fair  Appetite:  better  Current Medications: Current Facility-Administered Medications  Medication Dose Route Frequency Provider Last Rate Last Dose  . acetaminophen (TYLENOL) tablet 650 mg  650 mg Oral Q4H PRN Charm Rings, NP   650 mg at 06/18/16 0503  . albuterol (PROVENTIL HFA;VENTOLIN HFA) 108 (90 Base) MCG/ACT inhaler 2 puff  2 puff Inhalation Q4H Charm Rings, NP   2 puff at 06/17/16 1634  . alum & mag hydroxide-simeth (MAALOX/MYLANTA) 200-200-20 MG/5ML suspension  30 mL  30 mL Oral Q6H PRN Kerry Hough, PA-C   30 mL at 06/16/16 1859  . amLODipine (NORVASC) tablet 5 mg  5 mg Oral Daily Truman Hayward, FNP   5 mg at 06/18/16 0835  . cloNIDine (CATAPRES) tablet 0.1 mg  0.1 mg Oral QAC breakfast Sanjuana Kava, NP   0.1 mg at 06/18/16 0640  . feeding supplement (ENSURE ENLIVE) (ENSURE ENLIVE) liquid 237 mL  237 mL Oral BID BM Sanjuana Kava, NP   237 mL at 06/18/16 1445  . FLUoxetine (PROZAC) capsule 20 mg  20 mg Oral Daily Georgiann Cocker, MD   20 mg at 06/18/16 0835  . gabapentin (NEURONTIN) capsule 200 mg  200 mg Oral BID Charm Rings, NP   200 mg at 06/18/16 0836  . hydrochlorothiazide (HYDRODIURIL) tablet 25  mg  25 mg Oral Daily Charm Rings, NP   25 mg at 06/18/16 0836  . lisinopril (PRINIVIL,ZESTRIL) tablet 20 mg  20 mg Oral Daily Charm Rings, NP   20 mg at 06/18/16 0836  . magnesium hydroxide (MILK OF MAGNESIA) suspension 30 mL  30 mL Oral Daily PRN Charm Rings, NP      . nicotine (NICODERM CQ - dosed in mg/24 hours) patch 21 mg  21 mg Transdermal Daily Sanjuana Kava, NP   21 mg at 06/18/16 0836  . ondansetron (ZOFRAN) tablet 4 mg  4 mg Oral Q8H PRN Charm Rings, NP      . risperiDONE (RISPERDAL) tablet 0.5 mg  0.5 mg Oral BID Georgiann Cocker, MD   0.5 mg at 06/18/16 1610    Lab Results:  Results for orders placed or performed during the hospital encounter of 06/12/16 (from the past 48 hour(s))  TSH     Status: None   Collection Time: 06/18/16  6:18 AM  Result Value Ref Range   TSH 4.110 0.350 - 4.500 uIU/mL    Comment: Performed by a 3rd Generation assay with a functional sensitivity of <=0.01 uIU/mL. Performed at [facility], [address] [place], [place] [address]   Lipid panel     Status: Abnormal   Collection Time: 06/18/16  6:18 AM  Result Value Ref Range   Cholesterol 216 (H) 0 - 200 mg/dL   Triglycerides 45 <409 mg/dL   HDL 76 >81 mg/dL   Total CHOL/HDL Ratio 2.8 RATIO   VLDL 9 0 - 40 mg/dL   LDL Cholesterol 191 (H) 0 - 99 mg/dL    Comment:        Total Cholesterol/HDL:CHD Risk Coronary Heart Disease Risk Table                     Men   Women  1/2 Average Risk   3.4   3.3  Average Risk       5.0   4.4  2 X Average Risk   9.6   7.1  3 X Average Risk  23.4   11.0        Use the calculated Patient Ratio above and the CHD Risk Table to determine the patient's CHD Risk.        ATP III CLASSIFICATION (LDL):  <100     mg/dL   Optimal  478-295  mg/dL   Near or Above                    Optimal  130-159  mg/dL  Borderline  160-189  mg/dL   High  >190     mg/dL   Very High Performed at [facility], [address], [place],  [place] [address]     Blood Alcohol level:  Lab Results  Component Value Date   [facility] <5 06/11/2016   ETH <11 11/17/2011    Metabolic Disorder Labs: No results found for: HGBA1C, MPG No results found for: PROLACTIN Lab Results  Component Value Date   CHOL 216 (H) 06/18/2016   TRIG 45 06/18/2016   HDL 76 06/18/2016   CHOLHDL 2.8 06/18/2016   VLDL 9 06/18/2016   LDLCALC 131 (H) 06/18/2016   LDLCALC 99 07/15/2008    Physical Findings: AIMS: Facial and Oral Movements Muscles of Facial Expression: None, normal Lips and Perioral Area: None, normal Jaw: None, normal Tongue: None, normal,Extremity Movements Upper (arms, wrists, hands, fingers): None, normal Lower (legs, knees, ankles, toes): None, normal, Trunk Movements Neck, shoulders, hips: None, normal, Overall Severity Severity of abnormal movements (highest score from questions above): None, normal Incapacitation due to abnormal movements: None, normal Patient's awareness of abnormal movements (rate only patient's report): No Awareness, Dental Status Current problems with teeth and/or dentures?: No Does patient usually wear dentures?: No  CIWA:  CIWA-Ar Total: 1 COWS:  COWS Total Score: 2  Musculoskeletal: Strength & Muscle Tone: within normal limits Gait & Station: normal Patient leans: N/A  Psychiatric Specialty Exam: Physical Exam  Nursing note and vitals reviewed. Constitutional: He is oriented to person, place, and time. He appears well-developed and well-nourished.  HENT:  Head: Normocephalic and atraumatic.  Eyes: Conjunctivae and EOM are normal. Pupils are equal, round, and reactive to light.  Neck: Normal range of motion.  Cardiovascular: Normal rate, regular rhythm and normal heart sounds.   Respiratory: Effort normal and breath sounds normal.  GI: Soft. Bowel sounds are normal.  Musculoskeletal: Normal range of motion.  Neurological: He is alert and oriented to person, place, and time. He has normal reflexes.   Skin: Skin is warm and dry.  Psychiatric:  As above.     ROS  Blood pressure (!) 169/107, pulse 92, temperature 98 F (36.7 C), resp. rate 20, height  (1.651 m), weight 47.6 kg (105 lb), SpO2 96 %.Body mass index is 17.47 kg/m.  General Appearance:  Neatly dressed. Has own clothing now.  Pleasant and engaging well. Normal psychomotor activity. Good relatedness. Appropriate behavior. Not internally distracted.   Eye Contact:  Good  Speech:  Spontaneous. Normal tone and rate   Volume:  Normal   Mood:  Euthymic  Affect:  Full range and appropriate     Thought Process:  Linear  Orientation:  Full (Time, Place, and Person)  Thought Content:  Future oriented. No delusional theme. No preoccupation with violent thoughts. No negative ruminations. No obsession.  No hallucination in any modality.   Suicidal Thoughts:  No  Homicidal Thoughts:  No  Memory:  WNL  Judgement:  Good  Insight:  Good  Psychomotor Activity:  Normal   Concentration:  Good  Recall:  Good  Fund of Knowledge:  Good  Language:  Good  Akathisia:  No  Handed:    AIMS (if indicated):     Assets:  Desire for Improvement Financial Resources/Insurance Housing  ADL's:  Impaired  Cognition:  WNL  Sleep:  Number of Hours: 3.75   Treatment Plan Summary: Patient is at baseline. He is not a danger to self or others. He has been accepte>846 into  inpatient rehab. Bed is available on Wednesday. Patient feels he would relapse if he is in an unsupervised setting.  Psychiatric: Substance Induced Psychotic Disorder  Medical: Pneumonia HTN Seizure disorder  Psychosocial:  Lives alone No supportive relationship.   PLAN: 1. Continue current regimen. Prozac 20 mg daily depression, Risperdal 0.5 mg BID mood stabilization.  Add Trazodone 100 mg QHS insomnia 2. Continue to monitor mood, behavior and interaction with peers. 3. Consider cardiology referral after discharge to help control BP readings. Will start amlodipine   po daily for hypertension tomorrow 06/18/2016.Marland Kitchen   Lindwood Qua, NP [facility] 06/18/2016, 3:59 PM

## 2016-06-18 NOTE — [facility] Group Notes (Signed)
  [facility] Group Notes:  (Clinical Social Work)   06/18/2016    11:00AM-12:00PM  Summary of Progress/Problems:   The main focus of today's process group was to              1)  discuss the importance of adding appropriate supports to help in recovery goals             2)  identify the patient's current healthy supports             3)  talk about patients' current unhealthy supports and plan how to handle them             4)  Illustrate need for self support  An emphasis was placed on using professional supports as part of the plan to stay well and work on recovery.  The patient expressed full comprehension of the concepts presented.  The patient stated he looks forward to going to [facility] and have the support while he continues further in his recovery.  He stated that he is going to have to cut his mother out of his life entirely due to the criticism he gets.  When CSW suggested there is something between completely having no boundaries and eliminating someone from one's life altogether, he was not willing to consider that, but did agree to revisit it later.  Type of Therapy:  Process Group with Motivational Interviewing  Participation Level:  Active  Participation Quality:  Sharing  Affect:  Blunted  Cognitive:  Appropriate  Insight:  Engaged,   Engagement in Therapy:  Engaged  Modes of Intervention:   Education, Support and [employer], LCSW 06/18/2016    1:21 PM

## 2016-06-18 NOTE — Progress Notes (Signed)
    Date:  06/17/2016 Time:  0930      Goals Group Group Topic/Focus:  Goals Group:   The focus of this group is to help patients establish daily goals to achieve during treatment and discuss how the patient can incorporate goal setting into their daily lives to aide in recovery.  Participation Level:  Did Not Attend  Participation Quality:  n/a  Affect:  n/a  Cognitive:  n/a  Insight: n/a  Engagement in Group: n/a   Modes of Intervention:  n/a  Additional Comments:   Late entry Rich Brave 06/18/2016, 8:39 AM

## 2016-06-18 NOTE — Plan of Care (Signed)
Problem: Safety: Goal: Periods of time without injury will increase Outcome: Progressing Patient is on q15 minute safety checks and contracts for safety on the unit.   

## 2016-06-18 NOTE — Progress Notes (Signed)
D:  Patient's self inventory sheet, patient has fair sleep, no sleep medication given.  Good appetite, normal energy level, good concentration.  Rated depression 5, hopeless 4, anxiety 6.  Denied withdrawals.  SI, contracts for safety.  Denied physical problems.  Physical pain, worst pain #4, right side.  No pain medication.  Goal is stay focused!  Positive no matter what comes.  Plans to read positive things and get around positive people.  Does have discharge plans. A:  Patient took his scheduled medications this morning, but did refuse his albuterol because he did not need this medication.  Emotional support and encouragement given patient. R:  Patient denied SI and HI this morning while talking to nurse, contracts for safety.  Patient did check SI on self inventory form.  Denied A/V hallucinations.  Safety maintained with 15 minute checks. Patient thanked nurse for his tennis shoes out of locker this morning.

## 2016-06-18 NOTE — Progress Notes (Signed)
Nursing Progress Note 7p-7a  D) Patient presents flat and depressed. Patient refused his albuterol order stating "it makes my throat sore". Patient denied shortness of breath and was in no acute distress. Patient is seen interactive in the milieu. Patient states "I need something for sleep". Patient denies SI/HI/AVH or pain. Patient contracts for safety on the unit. Patient did attend group.  A) Emotional support given. 1:1 interaction and active listening provided. Medications reviewed with patient. Patient verbalized understanding of medications without further questions. Snacks and fluids provided. Opportunities for questions or concerns presented to patient. Patient encouraged to continue to work on treatment goals. Labs, vital signs and patient behavior monitored throughout shift. Patient safety maintained with q15 min safety checks.  R) Patient receptive to interaction with nurse. Patient remains safe on the unit at this time. Patient denies any adverse medication reactions at this time. Patient is resting in bed without complaints. Will continue to monitor.

## 2016-06-18 NOTE — [facility] Group Notes (Signed)

## 2016-06-19 LAB — PROLACTIN: Prolactin: 35.6 ng/mL — ABNORMAL HIGH (ref 4.0–15.2)

## 2016-06-19 NOTE — Plan of Care (Signed)
Problem: Coping: Goal: Ability to cope will improve Outcome: Progressing Nurse discussed depression/anxiety/coping skills with patient.    

## 2016-06-19 NOTE — Progress Notes (Signed)
Adult Psychoeducational Group Note  Date:  06/19/2016 Time:  9:08 PM  Group Topic/Focus:  Wrap-Up Group:   The focus of this group is to help patients review their daily goal of treatment and discuss progress on daily workbooks.  Participation Level:  Active  Participation Quality:  Appropriate  Affect:  Irritable  Cognitive:  Oriented  Insight: Limited  Engagement in Group:  Engaged  Modes of Intervention:  Socialization and Support  Additional Comments:  Patient attended and participated in group tonight. He reports having an great day. When he felt "on edge" today he was able to move pass it without drugs.  Lita Mains [facility] 06/19/2016, 9:08 PM

## 2016-06-19 NOTE — [facility] Group Notes (Signed)
[facility] LCSW Group Therapy  06/19/2016 3:13 PM  Type of Therapy:  Group Therapy  Participation Level:  Active  Participation Quality:  Attentive  Affect:  Appropriate  Cognitive:  Alert and Oriented  Insight:  Engaged  Engagement in Therapy:  Engaged  Modes of Intervention:  Confrontation, Discussion, Education, Problem-solving, Socialization and Support  Summary of Progress/Problems: Today's Topic: Overcoming Obstacles. Patients identified one short term goal and potential obstacles in reaching this goal. Patients processed barriers involved in overcoming these obstacles. Patients identified steps necessary for overcoming these obstacles and explored motivation (internal and external) for facing these difficulties head on.   Brian Lucero N Smart LCSW 06/19/2016, 3:13 PM

## 2016-06-19 NOTE — Progress Notes (Signed)
[facility] MD Progress Note  06/19/2016 2:08 PM Brian Lucero  MRN:  [id number] Subjective:   46 yo AAF presented to the ER via EMS. Background history of SUD. Reports suicidal thoughts and hallucinations. Very irritable and easily agitated. UDS was positive for cocaine and opiates. Malodorous and not willing to engage.  Seen today. Chart reviewed prior to interview. Patient states that he has been doing well mentally. He is not depressed. Reports normal energy. No perceptual abnormalities. No suicidal thoughts. He is still committed to inpatient addiction treatment starting on Wednesday. Complains of left sided chest pain. Precipitated by cough. Expectorates whitish sputum. No hemoptysis. No fever, no night sweats.   Nursing staff confirms that he has been coughing more. He has been appropriate on the unit. Patient has been interacting well with peers. No behavioral issues. Patient has not voiced any suicidal thoughts. Patient has not been observed to be internally stimulated. Patient has been adherent with treatment recommendations. Patient has been tolerating their medication well.     Principal Problem: Substance Induced Psychotic Disorder Diagnosis:   Patient Active Problem List   Diagnosis Date Noted  . 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 Major depressive disorder, recurrent episode, severe, with psychosis (HCC) [F33.3] 06/12/2016  . Cocaine use disorder, severe, dependence (HCC) [F14.20] 06/12/2016  . Major depressive disorder, recurrent severe without psychotic features (HCC) [F33.2] 06/12/2016  . Seizure (HCC) [R56.9] 11/17/2011  . Tobacco abuse [Z72.0] 11/17/2011  . Alcohol abuse [F10.10] 11/17/2011  . Cocaine abuse [F14.10] 11/17/2011  . Bipolar 2 disorder (HCC) [F31.81] 11/17/2011  . Hypertension [I10] 11/17/2011  . Hydrocephalus [G91.9] 11/17/2011  . Aspiration pneumonia (HCC) [J69.0] 11/17/2011   Total Time spent with patient: 20  minutes  Past Psychiatric History: As in H&P  Past Medical History:  Past Medical History:  Diagnosis Date  . Anxiety   . Bipolar 2 disorder (HCC)   . Depression   . Hypertension   . Pneumothorax    as a baby in the 81's  . Seizures (HCC)    History reviewed. No pertinent surgical history. Family History:  Family History  Problem Relation Age of Onset  . Diabetes type II Mother   . Diabetes type II     Family Psychiatric  History: As in H&P Social History:  History  Alcohol Use  . 1.2 oz/week  . 2 Cans of beer per week    Comment: 1 40oz Fri. and Sat.     History  Drug Use  . Types: Cocaine    Comment: Used yesterday    Social History   Social History  . Marital status: Single    Spouse name: N/A  . Number of children: N/A  . Years of education: N/A   Social History Main Topics  . Smoking status: Current Every Day Smoker    Packs/day: 0.20    Types: Cigarettes  . Smokeless tobacco: Never Used  . Alcohol use 1.2 oz/week    2 Cans of beer per week     Comment: 1 40oz Fri. and Sat.  . Drug use: Yes    Types: Cocaine     Comment: Used yesterday  . Sexual activity: Yes   Other Topics Concern  . None   Social History Narrative  . None   Additional Social History:   Single, lives alone. Never married. No kids. Not in any relationship. No support from his family. On disability. No legal issues. No other stressors.   Sleep: Fair  Appetite:  better  Current Medications: Current Facility-Administered Medications  Medication Dose Route Frequency Provider Last Rate Last Dose  . acetaminophen (TYLENOL) tablet 650 mg  650 mg Oral Q4H PRN Charm Rings, NP   650 mg at 06/18/16 2135  . albuterol (PROVENTIL HFA;VENTOLIN HFA) 108 (90 Base) MCG/ACT inhaler 2 puff  2 puff Inhalation Q4H Charm Rings, NP   2 puff at 06/19/16 1216  . alum & mag hydroxide-simeth (MAALOX/MYLANTA) 200-200-20 MG/5ML suspension 30 mL  30 mL Oral Q6H PRN Kerry Hough, PA-C   30 mL  at 06/16/16 1859  . amLODipine (NORVASC) tablet 5 mg  5 mg Oral Daily Truman Hayward, FNP   5 mg at 06/19/16 0800  . feeding supplement (ENSURE ENLIVE) (ENSURE ENLIVE) liquid 237 mL  237 mL Oral BID BM Sanjuana Kava, NP   237 mL at 06/19/16 1216  . FLUoxetine (PROZAC) capsule 20 mg  20 mg Oral Daily Georgiann Cocker, MD   20 mg at 06/19/16 0800  . gabapentin (NEURONTIN) capsule 200 mg  200 mg Oral BID Charm Rings, NP   200 mg at 06/19/16 0800  . hydrochlorothiazide (HYDRODIURIL) tablet 25 mg  25 mg Oral Daily Charm Rings, NP   25 mg at 06/19/16 0800  . lisinopril (PRINIVIL,ZESTRIL) tablet 20 mg  20 mg Oral Daily Charm Rings, NP   20 mg at 06/19/16 0800  . magnesium hydroxide (MILK OF MAGNESIA) suspension 30 mL  30 mL Oral Daily PRN Charm Rings, NP      . nicotine (NICODERM CQ - dosed in mg/24 hours) patch 21 mg  21 mg Transdermal Daily Sanjuana Kava, NP   21 mg at 06/19/16 0801  . ondansetron (ZOFRAN) tablet 4 mg  4 mg Oral Q8H PRN Charm Rings, NP      . risperiDONE (RISPERDAL) tablet 0.5 mg  0.5 mg Oral BID Georgiann Cocker, MD   0.5 mg at 06/19/16 0801  . traZODone (DESYREL) tablet 50 mg  50 mg Oral QHS,MR X 1 Jackelyn Poling, NP   50 mg at 06/18/16 2159    Lab Results:  Results for orders placed or performed during the hospital encounter of 06/12/16 (from the past 48 hour(s))  TSH     Status: None   Collection Time: 06/18/16  6:18 AM  Result Value Ref Range   TSH 4.110 0.350 - 4.500 uIU/mL    Comment: Performed by a 3rd Generation assay with a functional sensitivity of <=0.01 uIU/mL. Performed at [facility], [address] [place], [place] [address]   Lipid panel     Status: Abnormal   Collection Time: 06/18/16  6:18 AM  Result Value Ref Range   Cholesterol 216 (H) 0 - 200 mg/dL   Triglycerides 45 <604 mg/dL   HDL 76 >54 mg/dL   Total CHOL/HDL Ratio 2.8 RATIO   VLDL 9 0 - 40 mg/dL   LDL Cholesterol 098 (H) 0 - 99 mg/dL    Comment:         Total Cholesterol/HDL:CHD Risk Coronary Heart Disease Risk Table                     Men   Women  1/2 Average Risk   3.4   3.3  Average Risk       5.0   4.4  2 X Average Risk   9.6   7.1  3 X Average  Risk  23.4   11.0        Use the calculated Patient Ratio above and the CHD Risk Table to determine the patient's CHD Risk.        ATP III CLASSIFICATION (LDL):  <100     mg/dL   Optimal  161-096  mg/dL   Near or Above                    Optimal  130-159  mg/dL   Borderline  045-409  mg/dL   High  >811     mg/dL   Very High Performed at [facility], [address], [place], [place] [address]   Prolactin     Status: Abnormal   Collection Time: 06/18/16  6:18 AM  Result Value Ref Range   Prolactin 35.6 (H) 4.0 - 15.2 ng/mL    Comment: (NOTE) Performed At: [facility] [address] [place], [place] [id number] Mila Homer MD MV:[id number] Performed at [facility], [address] [place], [place] [address]     Blood Alcohol level:  Lab Results  Component Value Date   [facility] <5 06/11/2016   ETH <11 11/17/2011    Metabolic Disorder Labs: No results found for: HGBA1C, MPG Lab Results  Component Value Date   PROLACTIN 35.6 (H) 06/18/2016   Lab Results  Component Value Date   CHOL 216 (H) 06/18/2016   TRIG 45 06/18/2016   HDL 76 06/18/2016   CHOLHDL 2.8 06/18/2016   VLDL 9 06/18/2016   LDLCALC 131 (H) 06/18/2016   LDLCALC 99 07/15/2008    Physical Findings: AIMS: Facial and Oral Movements Muscles of Facial Expression: None, normal Lips and Perioral Area: None, normal Jaw: None, normal Tongue: None, normal,Extremity Movements Upper (arms, wrists, hands, fingers): None, normal Lower (legs, knees, ankles, toes): None, normal, Trunk Movements Neck, shoulders, hips: None, normal, Overall Severity Severity of abnormal movements (highest score from questions above): None, normal Incapacitation due to abnormal movements: None,  normal Patient's awareness of abnormal movements (rate only patient's report): No Awareness, Dental Status Current problems with teeth and/or dentures?: No Does patient usually wear dentures?: No  CIWA:  CIWA-Ar Total: 1 COWS:  COWS Total Score: 3  Musculoskeletal: Strength & Muscle Tone: within normal limits Gait & Station: normal Patient leans: N/A  Psychiatric Specialty Exam: Physical Exam  Constitutional: He is oriented to person, place, and time. He appears well-developed and well-nourished.  HENT:  Head: Normocephalic and atraumatic.  Eyes: Conjunctivae and EOM are normal. Pupils are equal, round, and reactive to light.  Neck: Normal range of motion.  Cardiovascular: Normal rate, regular rhythm and normal heart sounds.   Respiratory: Effort normal and breath sounds normal.  GI: Soft. Bowel sounds are normal.  Musculoskeletal: Normal range of motion.  Neurological: He is alert and oriented to person, place, and time. He has normal reflexes.  Skin: Skin is warm and dry.  Psychiatric:  As above.     ROS  Blood pressure 133/81, pulse (!) 102, temperature 98 F (36.7 C), temperature source Oral, resp. rate 20, height  (1.651 m), weight 47.6 kg (105 lb), SpO2 96 %.Body mass index is 17.47 kg/m.  General Appearance:  Neatly dressed, pleasant, engaging well and cooperative. Appropriate behavior. Not in any distress. Good relatedness. Not internally stimulated   Eye Contact:  Good  Speech:  Spontaneous. Normal tone and rate   Volume:  Normal   Mood:  Euthymic  Affect:  Full range and appropriate     Thought Process:  Linear  Orientation:  Full (Time, Place, and Person)  Thought Content:  Future oriented. No delusional theme. No preoccupation with violent thoughts. No negative ruminations. No obsession.  No hallucination in any modality.   Suicidal Thoughts:  No  Homicidal Thoughts:  No  Memory:  WNL  Judgement:  Good  Insight:  Good  Psychomotor Activity:  Normal    Concentration:  Good  Recall:  Good  Fund of Knowledge:  Good  Language:  Good  Akathisia:  No  Handed:    AIMS (if indicated):     Assets:  Desire for Improvement Financial Resources/Insurance Housing  ADL's:  Impaired  Cognition:  WNL  Sleep:  Number of Hours: 4.75     Treatment Plan Summary: Patient is at baseline. He is not a danger to self or others. He has been accepted into inpatient rehab. Bed is available on Wednesday. Patient feels he would relapse if he is in an unsupervised setting.   Psychiatric: Substance Induced Psychotic Disorder  Medical: Pneumonia HTN Seizure disorder  Psychosocial:  Lives alone No supportive relationship.   PLAN: 1. Continue current regimen 2. Continue to monitor mood, behavior and interaction with peers. 3. Chest X-ray   Georgiann Cocker, MD 06/19/2016, 2:08 PMPatient ID: Brian Lucero, male   DOB: 02/01/1971, 46 y.o.   MRN: [id number] Patient ID: Brian Lucero, male   DOB: [birth year], 46 y.o.   MRN: [id number] Patient ID: Brian Lucero, male   DOB: 12-20-1970, 46 y.o.   MRN: [id number]

## 2016-06-19 NOTE — Progress Notes (Signed)
D:  Patient's self inventory sheet, patient sleeps good, no sleep medication given.  Fair appetite, high energy level, good concentration.  Rated depression and hopeless 3, anxiety 4.  Denied withdrawals.  SI, contracts for safety.  Physical problems, cough, no pain medication.  Wants to know reason for annoying cough.  Plans to talk to MD and SW.  Does have discharge plans. A:  Medications administered per MD orders.  Emotional support and encouragement given patient. R:  Denied SI this morning while talking to nurse, no plans to harm self, contracts for safety.  Marked SI on self inventory sheet.  Denied HI.  Denied A/V hallucinations.  Safety maintained with 15 minute checks. Patient is concerned about his cough and will discuss with MD.

## 2016-06-19 NOTE — Progress Notes (Signed)
Recreation Therapy Notes  Date: 06/19/16 Time: 0930 Location: 400 [address] Dayroom  Group Topic: Stress Management  Goal Area(s) Addresses:  Patient will verbalize importance of using healthy stress management.  Patient will identify positive emotions associated with healthy stress management.   Behavioral Response: Engaged  Intervention: Stress Management  Activity :  Guided Imagery.  LRT introduced the stress management technique of guided imagery.  LRT read a script to allow patients to participate in the technique of guided imagery.  Patients were to follow along as the script was read to engage in the activity.  Education:  Stress Management, Discharge Planning.   Education Outcome: Acknowledges edcuation/In group clarification offered/Needs additional education  Clinical Observations/Feedback: Pt attended group.   Caroll Rancher, LRT/CTRS     Caroll Rancher A 06/19/2016 11:37 AM

## 2016-06-19 NOTE — Progress Notes (Signed)
[facility], has scheduled this patient at 9:30 a.m. For DG CHEST 2 VIEW per MD order.  [facility] informed.

## 2016-06-20 ENCOUNTER — Ambulatory Visit (HOSPITAL_COMMUNITY)
Admission: AD | Admit: 2016-06-20 | Discharge: 2016-06-20 | Disposition: A | Discharge status: Home / Self Care | Payer: Medicare HMO | Source: Intra-hospital | Attending: Psychiatry | Admitting: Psychiatry

## 2016-06-20 DIAGNOSIS — F19951 Other psychoactive substance use, unspecified with psychoactive substance-induced psychotic disorder with hallucinations: Secondary | ICD-10-CM

## 2016-06-20 DIAGNOSIS — J181 Lobar pneumonia, unspecified organism: Secondary | ICD-10-CM

## 2016-06-20 LAB — CBC WITH DIFFERENTIAL/PLATELET
Basophils Absolute: 0 10*3/uL (ref 0.0–0.1)
Basophils Relative: 0 %
EOS ABS: 0.4 10*3/uL (ref 0.0–0.7)
EOS PCT: 4 %
HCT: 32.9 % — ABNORMAL LOW (ref 39.0–52.0)
Hemoglobin: 11.2 g/dL — ABNORMAL LOW (ref 13.0–17.0)
LYMPHS ABS: 2 10*3/uL (ref 0.7–4.0)
Lymphocytes Relative: 19 %
MCH: 30 pg (ref 26.0–34.0)
MCHC: 34 g/dL (ref 30.0–36.0)
MCV: 88.2 fL (ref 78.0–100.0)
MONOS PCT: 9 %
Monocytes Absolute: 1 10*3/uL (ref 0.1–1.0)
Neutro Abs: 7 10*3/uL (ref 1.7–7.7)
Neutrophils Relative %: 68 %
PLATELETS: 223 10*3/uL (ref 150–400)
RBC: 3.73 MIL/uL — ABNORMAL LOW (ref 4.22–5.81)
RDW: 14.5 % (ref 11.5–15.5)
WBC: 10.4 10*3/uL (ref 4.0–10.5)

## 2016-06-20 LAB — BASIC METABOLIC PANEL
Anion gap: 10 (ref 5–15)
BUN: 24 mg/dL — AB (ref 6–20)
CO2: 28 mmol/L (ref 22–32)
CREATININE: 0.98 mg/dL (ref 0.61–1.24)
Calcium: 9.3 mg/dL (ref 8.9–10.3)
Chloride: 99 mmol/L — ABNORMAL LOW (ref 101–111)
GFR calc Af Amer: >60 mL/min (ref >60)
GFR calc non Af Amer: >60 mL/min (ref >60)
GLUCOSE: 94 mg/dL (ref 65–99)
Potassium: 3.9 mmol/L (ref 3.5–5.1)
Sodium: 137 mmol/L (ref 135–145)

## 2016-06-20 MED ORDER — TUBERCULIN PPD 5 UNIT/0.1ML ID SOLN: 5.0000 [IU] | Freq: Once | INTRADERMAL | Status: DC

## 2016-06-20 MED ORDER — TRAZODONE HCL 50 MG PO TABS: ORAL_TABLET | ORAL | 0 refills | Status: DC

## 2016-06-20 MED ORDER — LISINOPRIL 20 MG PO TABS: 20.0000 mg | ORAL_TABLET | Freq: Every day | ORAL | 0 refills | Status: DC

## 2016-06-20 MED ORDER — GABAPENTIN 100 MG PO CAPS: 200.0000 mg | ORAL_CAPSULE | Freq: Two times a day (BID) | ORAL | 0 refills | Status: DC

## 2016-06-20 MED ORDER — FLUOXETINE HCL 20 MG PO CAPS: 20.0000 mg | ORAL_CAPSULE | Freq: Every day | ORAL | 0 refills | Status: DC

## 2016-06-20 MED ORDER — TRAZODONE HCL 50 MG PO TABS
50.0000 mg | ORAL_TABLET | Freq: Every evening | ORAL | Status: DC | PRN
  Administered 2016-06-20: 50 mg via ORAL

## 2016-06-20 MED ORDER — TRAZODONE HCL 50 MG PO TABS
50.0000 mg | ORAL_TABLET | Freq: Every day | ORAL | Status: DC
  Filled 2016-06-20: qty 14

## 2016-06-20 MED ORDER — NICOTINE 21 MG/24HR TD PT24: 21.0000 mg | MEDICATED_PATCH | Freq: Every day | TRANSDERMAL | 0 refills | Status: DC

## 2016-06-20 MED ORDER — RISPERIDONE 0.5 MG PO TABS: 0.5000 mg | ORAL_TABLET | Freq: Two times a day (BID) | ORAL | 0 refills | Status: DC

## 2016-06-20 MED ORDER — AMLODIPINE BESYLATE 5 MG PO TABS: 5.0000 mg | ORAL_TABLET | Freq: Every day | ORAL | 0 refills | Status: DC

## 2016-06-20 MED ORDER — ALBUTEROL SULFATE HFA 108 (90 BASE) MCG/ACT IN AERS: 2.0000 | INHALATION_SPRAY | RESPIRATORY_TRACT | Status: DC

## 2016-06-20 MED ORDER — HYDROCHLOROTHIAZIDE 25 MG PO TABS: 25.0000 mg | ORAL_TABLET | Freq: Every day | ORAL | 0 refills | Status: DC

## 2016-06-20 MED ORDER — LEVOFLOXACIN 250 MG PO TABS
750.0000 mg | ORAL_TABLET | Freq: Every day | ORAL | Status: DC
  Administered 2016-06-20 – 2016-06-23 (×4): 750 mg via ORAL
  Filled 2016-06-20: qty 1
  Filled 2016-06-20 (×3): qty 3
  Filled 2016-06-20: qty 1
  Filled 2016-06-20: qty 3
  Filled 2016-06-20: qty 1
  Filled 2016-06-20: qty 3

## 2016-06-20 NOTE — Plan of Care (Signed)
Problem: Medication: Goal: Compliance with prescribed medication regimen will improve Outcome: Progressing Patient taking medications as prescribed. Patient using incentive spirometer. Regimen reviewed with patient and patient verbalized understanding.

## 2016-06-20 NOTE — Progress Notes (Signed)
  [facility] Adult Case Management Discharge Plan :  Will you be returning to the same living situation after discharge:  No.Pt going to [facility] for screening WED AM. At discharge, do you have transportation home?: Yes,  taxi voucher in chart. Pt must d/c no later than 7am on Wed 4/18 Do you have the ability to pay for your medications: Yes,  [place] Medicaid and Medicare  Release of information consent forms completed and in the chart;  Patient's signature needed at discharge.  Patient to Follow up at: Follow-up Information    [facility] Follow up on 06/21/2016.   Why:  Screening for possible admission on Wednesday at 7:45AM. Please bring photo ID/proof of [place] residence and [place] card. Please bring 2 weeks of medications and 30 day prescription supplied to you by the hospital. Thank you.  Contact information: [address] [place] [address] [phone]        [facility] Follow up.   Specialty:  Behavioral Health Why:  Walk in within 7 days of hospital or treatment facility discharge to be assessed for mental health services including Medication management and counseling. Walk in hours: Monday through Friday 8am-9am. Thank you.  Contact information: [address] [place] [address] [phone]           Next level of care provider has access to [facility] Link:no  Safety Planning and Suicide Prevention discussed: Yes,  SPE completed with pt; pt declined to consent to family contact.  Have you used any form of tobacco in the last 30 days? (Cigarettes, Smokeless Tobacco, Cigars, and/or Pipes): Yes  Has patient been referred to the Quitline?: Patient refused referral  Patient has been referred for addiction treatment: Yes  Iliyah Bui N Smart LCSW 06/20/2016, 8:38 AM

## 2016-06-20 NOTE — Progress Notes (Signed)
Called [name] to arrange transport. 

## 2016-06-20 NOTE — Progress Notes (Signed)
Per Dr. Jama Flavors, due to pneumonia/medical issues, patient will not be discharing to [facility] tomorrow. [facility] screening has been rescheduled for Monday 4/26 at 745AM.   [name], MSW, LCSW Clinical Social Worker 06/20/2016 2:15 PM

## 2016-06-20 NOTE — [facility] Group Notes (Signed)
[facility] LCSW Group Therapy  06/20/2016 1:23 PM  Type of Therapy:  Group Therapy  Participation Level:  Active  Participation Quality:  Attentive  Affect:  Appropriate  Cognitive:  Alert and Oriented  Insight:  Engaged  Engagement in Therapy:  Engaged  Modes of Intervention:  Confrontation, Discussion, Education, Problem-solving, Socialization and Support  Summary of Progress/Problems: MHA Speaker came to talk about his personal journey with substance abuse and addiction. The pt processed ways by which to relate to the speaker. MHA speaker provided handouts and educational information pertaining to groups and services offered by the [facility].   Brian Lucero N Smart LCSW 06/20/2016, 1:23 PM

## 2016-06-20 NOTE — Progress Notes (Signed)
Nursing Progress Note 7p-7a  D) Patient presents calm, pleasant and cooperative. Patient is seen interactive in the milieu. Patient states "I am feeling okay, no better, no worse". Patient denies shortness of breath and is in no acute distress. Patient denies SI/HI/AVH or pain. Patient agreeable to taking his albuterol. Patient contracts for safety on the unit. Incentive spirometer usage reviewed with patient, patient verbalized understanding and reported to nurse "I have been using it".  A) Emotional support given. 1:1 interaction and active listening provided. Patient medicated with PM orders as prescribed. Medications reviewed with patient. Patient verbalized understanding of medications without further questions.  Snacks and fluids provided. Opportunities for questions or concerns presented to patient. Patient encouraged to continue to work on treatment goals. Labs, vital signs and patient behavior monitored throughout shift. Patient safety maintained with q15 min safety checks.  R) Patient receptive to interaction with nurse. Patient remains safe on the unit at this time. Patient denies any adverse medication reactions at this time. Patient is resting in bed without complaints. Will continue to monitor.

## 2016-06-20 NOTE — Progress Notes (Signed)
Brian Lucero denied SI to this Clinical research associate but endorsed some thoughts on his self inventory form. He contracted for safety, however. He denied HI and AVH. He has been pleasant and cooperative, and interacting well with other patients on the hall. Brian Lucero reports feeling "about the same" physically. He reported fair sleep, good appetite, normal energy level, and good concentration. He rated his depression 2/10, hopelessness 2/10, and anxiety 4/10.   A: Meds given as ordered, including PRNs (see MAR). Instructed pt in use of incentive spirometer, which is now at pt's bedside with instructions. Q15 safety checks maintained. Support/encouragement offered.  R: Pt remains free from harm and continues with treatment. Will continue to monitor for needs/safety.

## 2016-06-20 NOTE — Progress Notes (Signed)
Recreation Therapy Notes   Animal-Assisted Activity (AAA) Program Checklist/Progress Notes Patient Eligibility Criteria Checklist & Daily Group note for Rec TxIntervention  Date: 04.17.2018 Time: 2:45pm Location: [address] Hall Dayroom   AAA/T Program Assumption of Risk Form signed by Patient/ or Parent Legal Guardian Yes  Patient is free of allergies or sever asthma Yes  Patient reports no fear of animals Yes  Patient reports no history of cruelty to animals Yes  Patient understands his/her participation is voluntary Yes  Patient washes hands before animal contact Yes  Patient washes hands after animal contact Yes  Behavioral Response: Engaged, Appropriate   Education:Hand Washing, Appropriate Animal Interaction   Education Outcome: Acknowledges education.   Clinical Observations/Feedback: Patient attended session and interacted appropriately with therapy dog and peers.   Brian Lucero L Brian Lucero, LRT/CTRS        Brian Lucero L 06/20/2016 3:08 PM 

## 2016-06-20 NOTE — Consult Note (Signed)
Medical Consultation   Brian Lucero  EUM:[id number]  DOB: 1970/08/21  DOA: 06/12/2016  PCP: Pcp Not In System  Outpatient Specialists:    Requesting physician: Dr Parke Poisson.   Reason for consultation: Cough, persistent PNA, new small left pleura effusion.   History of Present Illness: Brian Lucero is an 46 y.o. male with PMH significant for HTN, Drug abuse (cocaine, alcohol), Seizure, Bipolar, who was admitted to [facility] on 4-10 with auditory hallucination, worsening depression, and cough. He was found to have PNA. He received 5 days course of Levaquin. He relates that after he finished antibiotics he was feeling better. He started to have productive cough, with white phlegm, again. He denies fevers, chills, nausea, vomiting. He report pleuritic chest pain and some SOB. He is not sick or ill appearing. He denies abdominal pain.      Review of Systems:  ROS As per HPI otherwise 10 point review of systems negative.     Past Medical History: Past Medical History:  Diagnosis Date  . Anxiety   . Bipolar 2 disorder ([place])   . Depression   . Hypertension   . Pneumothorax    as a baby in the 75's  . Seizures ([place])     Past Surgical History: History reviewed. No pertinent surgical history.   Allergies:  No Known Allergies   Social History:  reports that he has been smoking Cigarettes.  He has been smoking about 0.20 packs per day. He has never used smokeless tobacco. He reports that he drinks about 1.2 oz of alcohol per week . He reports that he uses drugs, including Cocaine.   Family History: Family History  Problem Relation Age of Onset  . Diabetes type II Mother   . Diabetes type II        Physical Exam: Vitals:   06/19/16 2014 06/20/16 0819 06/20/16 0946 06/20/16 0948  BP: (!) 154/83 (!) 146/90 (!) 146/88 (!) 146/90  Pulse: (!) 108  (!) 103 (!) 111  Resp:   20   Temp:      TempSrc:      SpO2:      Weight:      Height:         Constitutional:Alert and awake, oriented x3, not in any acute distress. Eyes: PERLA, EOMI, irises appear normal, anicteric sclera,  ENMT: external ears and nose appear normal, normal hearing             Lips appears normal, oropharynx mucosa, tongue, posterior pharynx appear normal  Neck: neck appears normal, no masses, normal ROM, no thyromegaly, no JVD  CVS: S1-S2 clear, no murmur rubs or gallops, no LE edema, normal pedal pulses  Respiratory:  Diminishes on left side, no wheezing, rales or rhonchi. Respiratory effort normal. No accessory muscle use.  Abdomen: soft nontender, nondistended, normal bowel sounds, no hepatosplenomegaly, no hernias  Musculoskeletal: : no cyanosis, clubbing or edema noted bilaterally lower extremity, left arm with deformity, abscenss of forearm.                         Neuro: Cranial nerves II-XII intact, strength, sensation, reflexes Psych: judgement and insight appear normal, stable mood and affect, mental status Skin: no rashes or lesions or ulcers, no induration or nodules     Data reviewed:  I have personally reviewed following labs and imaging studies Labs:  CBC: No results for input(s):  WBC, NEUTROABS, HGB, HCT, MCV, PLT in the last 168 hours.  Basic Metabolic Panel: No results for input(s): NA, K, CL, CO2, GLUCOSE, BUN, CREATININE, CALCIUM, MG, PHOS in the last 168 hours. GFR Estimated Creatinine Clearance: 52.2 mL/min (by C-G formula based on SCr of 1.19 mg/dL). Liver Function Tests: No results for input(s): AST, ALT, ALKPHOS, BILITOT, PROT, ALBUMIN in the last 168 hours. No results for input(s): LIPASE, AMYLASE in the last 168 hours. No results for input(s): AMMONIA in the last 168 hours. Coagulation profile No results for input(s): INR, PROTIME in the last 168 hours.  Cardiac Enzymes: No results for input(s): CKTOTAL, CKMB, CKMBINDEX, TROPONINI in the last 168 hours. BNP: Invalid input(s): POCBNP CBG: No results for input(s): GLUCAP  in the last 168 hours. D-Dimer No results for input(s): DDIMER in the last 72 hours. Hgb A1c No results for input(s): HGBA1C in the last 72 hours. Lipid Profile  Recent Labs  06/18/16 0618  CHOL 216*  HDL 76  LDLCALC 131*  TRIG 45  CHOLHDL 2.8   Thyroid function studies  Recent Labs  06/18/16 0618  TSH 4.110   Anemia work up No results for input(s): VITAMINB12, FOLATE, FERRITIN, TIBC, IRON, RETICCTPCT in the last 72 hours. Urinalysis    Component Value Date/Time   COLORURINE YELLOW 11/17/2011 2059   APPEARANCEUR HAZY (A) 11/17/2011 2059   LABSPEC 1.021 11/17/2011 2059   PHURINE 5.5 11/17/2011 2059   GLUCOSEU NEGATIVE 11/17/2011 2059   HGBUR NEGATIVE 11/17/2011 2059   BILIRUBINUR SMALL (A) 11/17/2011 2059   KETONESUR 15 (A) 11/17/2011 2059   PROTEINUR NEGATIVE 11/17/2011 2059   UROBILINOGEN 1.0 11/17/2011 2059   NITRITE NEGATIVE 11/17/2011 2059   LEUKOCYTESUR NEGATIVE 11/17/2011 2059     Microbiology No results found for this or any previous visit (from the past 240 hour(s)).     Inpatient Medications:   Scheduled Meds: . albuterol  2 puff Inhalation Q4H  . amLODipine  5 mg Oral Daily  . feeding supplement (ENSURE ENLIVE)  237 mL Oral BID BM  . FLUoxetine  20 mg Oral Daily  . gabapentin  200 mg Oral BID  . hydrochlorothiazide  25 mg Oral Daily  . levofloxacin  750 mg Oral Daily  . lisinopril  20 mg Oral Daily  . nicotine  21 mg Transdermal Daily  . risperiDONE  0.5 mg Oral BID  . traZODone  50 mg Oral QHS,MR X 1   Continuous Infusions:   Radiological Exams on Admission: Dg Chest 2 View  Result Date: 06/20/2016 CLINICAL DATA:  Follow-up of left-sided pneumonia. EXAM: CHEST  2 VIEW COMPARISON:  Chest x-ray of June 11, 2016 FINDINGS: The lungs are well-expanded. There is persistent infiltrate at the left lung base. There is a new small left pleural effusion. No definite infiltrate is observed elsewhere. The heart is normal in size. The central  pulmonary vascularity is prominent. There is no definite interstitial edema. There is calcification in the wall of the aortic arch. The mediastinum is normal in width. The bony thorax exhibits no acute abnormality. IMPRESSION: Underlying COPD or reactive airway disease. Persistent pneumonia in the left lower lobe. New small left pleural effusion. An additional follow-up chest x-ray in 2-3 weeks is recommended to assure clearing. Thoracic aortic atherosclerosis. Electronically Signed   By: David  Martinique M.D.   On: 06/20/2016 13:03    Impression/Recommendations Principal Problem:   Substance-induced psychotic disorder with hallucinations ([place]) Active Problems:   Major depressive disorder, recurrent severe without psychotic features (  [place])  1-PNA, with small pleural effusion.  Due to reoccurrence of symptoms, I will start Levaquin to treat for 5 more days. Patient will need follow up chest  X ray in 2 to 3 weeks to document resolution of pleural effusion, or sooner if symptoms gets worse.  I will also check labs: CBC, B-met.  Will order incentive spirometry.  Continue with nebulizer.    2-HTN; continue with Norvasc, Lisinopril, HCTZ.  Check renal function.   Thank you for this consultation.  Our [facility] hospitalist team will follow the patient with you again 4-18.    Time Spent: 65 minutes.   Niel Hummer A M.D. [facility] 06/20/2016, 3:02 PM

## 2016-06-20 NOTE — Tx Team (Signed)
Interdisciplinary Treatment and Diagnostic Plan Update  06/20/2016 Time of Session: 0930 Brian Lucero MRN: [id number]  Principal Diagnosis: MDD  Secondary Diagnoses: Principal Problem:   Substance-induced psychotic disorder with hallucinations ([place]) Active Problems:   Major depressive disorder, recurrent severe without psychotic features ([place])   Current Medications:  Current Facility-Administered Medications  Medication Dose Route Frequency Provider Last Rate Last Dose  . acetaminophen (TYLENOL) tablet 650 mg  650 mg Oral Q4H PRN Patrecia Pour, NP   650 mg at 06/18/16 2135  . albuterol (PROVENTIL HFA;VENTOLIN HFA) 108 (90 Base) MCG/ACT inhaler 2 puff  2 puff Inhalation Q4H Patrecia Pour, NP   2 puff at 06/20/16 0818  . alum & mag hydroxide-simeth (MAALOX/MYLANTA) 200-200-20 MG/5ML suspension 30 mL  30 mL Oral Q6H PRN Laverle Hobby, PA-C   30 mL at 06/16/16 1859  . amLODipine (NORVASC) tablet 5 mg  5 mg Oral Daily Nanci Pina, FNP   5 mg at 06/20/16 0819  . feeding supplement (ENSURE ENLIVE) (ENSURE ENLIVE) liquid 237 mL  237 mL Oral BID BM Encarnacion Slates, NP   237 mL at 06/20/16 4536  . FLUoxetine (PROZAC) capsule 20 mg  20 mg Oral Daily Artist Beach, MD   20 mg at 06/20/16 0819  . gabapentin (NEURONTIN) capsule 200 mg  200 mg Oral BID Patrecia Pour, NP   200 mg at 06/20/16 0819  . hydrochlorothiazide (HYDRODIURIL) tablet 25 mg  25 mg Oral Daily Patrecia Pour, NP   25 mg at 06/20/16 0819  . lisinopril (PRINIVIL,ZESTRIL) tablet 20 mg  20 mg Oral Daily Patrecia Pour, NP   20 mg at 06/20/16 0819  . magnesium hydroxide (MILK OF MAGNESIA) suspension 30 mL  30 mL Oral Daily PRN Patrecia Pour, NP      . nicotine (NICODERM CQ - dosed in mg/24 hours) patch 21 mg  21 mg Transdermal Daily Encarnacion Slates, NP   21 mg at 06/20/16 0819  . ondansetron (ZOFRAN) tablet 4 mg  4 mg Oral Q8H PRN Patrecia Pour, NP      . risperiDONE (RISPERDAL) tablet 0.5 mg  0.5 mg Oral BID Artist [place], MD   0.5 mg at 06/20/16 0819  . traZODone (DESYREL) tablet 50 mg  50 mg Oral QHS,MR X 1 Rozetta Nunnery, NP   50 mg at 06/19/16 2133   PTA Medications: Prescriptions Prior to Admission  Medication Sig Dispense Refill Last Dose  . divalproex (DEPAKOTE) 500 MG DR tablet Take 500 mg by mouth 2 (two) times daily.   Past Week at Unknown time  . hydrochlorothiazide (HYDRODIURIL) 25 MG tablet Take 1 tablet (25 mg total) by mouth daily. 30 tablet 0 Past Week at Unknown time  . [EXPIRED] levofloxacin (LEVAQUIN) 750 MG tablet Take 1 tablet (750 mg total) by mouth daily. X 7 days 6 tablet 0   . lisinopril (PRINIVIL,ZESTRIL) 20 MG tablet Take 1 tablet (20 mg total) by mouth daily. 30 tablet 0 Past Week at Unknown time  . sertraline (ZOLOFT) 100 MG tablet Take 100 mg by mouth 3 (three) times daily.   Past Week at Unknown time    Patient Stressors: Substance abuse  Patient Strengths: Curator fund of knowledge  Treatment Modalities: Medication Management, Group therapy, Case management,  1 to 1 session with clinician, Psychoeducation, Recreational therapy.   Physician Treatment Plan for Primary Diagnosis: MDD  Medication Management: Evaluate patient's response, side effects, and tolerance  of medication regimen.  Therapeutic Interventions: 1 to 1 sessions, Unit Group sessions and Medication administration.  Evaluation of Outcomes: Met  Physician Treatment Plan for Secondary Diagnosis: Principal Problem:   Substance-induced psychotic disorder with hallucinations ([place]) Active Problems:   Major depressive disorder, recurrent severe without psychotic features ([place])  Long Term Goal(s): Improvement in symptoms so as ready for discharge Improvement in symptoms so as ready for discharge   Short Term Goals: Ability to identify changes in lifestyle to reduce recurrence of condition will improve Ability to verbalize feelings will improve Ability to disclose and discuss  suicidal ideas Ability to demonstrate self-control will improve Ability to identify and develop effective coping behaviors will improve Compliance with prescribed medications will improve Ability to identify triggers associated with substance abuse/mental health issues will improve     Medication Management: Evaluate patient's response, side effects, and tolerance of medication regimen.  Therapeutic Interventions: 1 to 1 sessions, Unit Group sessions and Medication administration.  Evaluation of Outcomes:  Met   RN Treatment Plan for Primary Diagnosis: MDD Long Term Goal(s): Knowledge of disease and therapeutic regimen to maintain health will improve  Short Term Goals: Ability to remain free from injury will improve, Ability to verbalize feelings will improve and Ability to disclose and discuss suicidal ideas  Medication Management: RN will administer medications as ordered by provider, will assess and evaluate patient's response and provide education to patient for prescribed medication. RN will report any adverse and/or side effects to prescribing provider.  Therapeutic Interventions: 1 on 1 counseling sessions, Psychoeducation, Medication administration, Evaluate responses to treatment, Monitor vital signs and CBGs as ordered, Perform/monitor CIWA, COWS, AIMS and Fall Risk screenings as ordered, Perform wound care treatments as ordered.  Evaluation of Outcomes: Met   LCSW Treatment Plan for Primary Diagnosis: MDD Long Term Goal(s): Safe transition to appropriate next level of care at discharge, Engage patient in therapeutic group addressing interpersonal concerns.  Short Term Goals: Engage patient in aftercare planning with referrals and resources, Facilitate patient progression through stages of change regarding substance use diagnoses and concerns and Identify triggers associated with mental health/substance abuse issues  Therapeutic Interventions: Assess for all discharge needs, 1  to 1 time with Social worker, Explore available resources and support systems, Assess for adequacy in community support network, Educate family and significant other(s) on suicide prevention, Complete Psychosocial Assessment, Interpersonal group therapy.  Evaluation of Outcomes: Met   Progress in Treatment: Attending groups: Yes. Participating in groups: Yes. Taking medication as prescribed: Yes. Toleration medication: Yes. Family/Significant other contact made: PE completed with pt; pt declines to consent to family contact. S Patient understands diagnosis: Yes. Discussing patient identified problems/goals with staff: Yes. Medical problems stabilized or resolved: Yes. Denies suicidal/homicidal ideation: yes, per self report.  Issues/concerns per patient self-inventory: No. Other: n/a  New problem(s) identified: No, Describe:  n/a  New Short Term/Long Term Goal(s): detox; elimination of AH/SI, medication stabilization; development of comprehensive mental wellness/sobriety plan.   Discharge Plan or Barriers: Pt has a screening for possible admission scheduled on Wed at 7:45AM. [facility] for outpatient services.   Reason for Continuation of Hospitalization: medication management   Estimated Length of Stay: 1 day  (pt scheduled for early morning discharge on Wed).   Attendees: Patient: 06/20/2016 8:41 AM  Physician: Dr. Parke Poisson MD 06/20/2016 8:41 AM  Nursing:  06/20/2016 8:41 AM  RN Care Manager: Lars Pinks CM 06/20/2016 8:41 AM  Social Worker: Maxie Better, LCSW 06/20/2016 8:41 AM  Recreational Therapist: Rhunette Croft 06/20/2016  8:41 AM  Other:  06/20/2016 8:41 AM  Other:  06/20/2016 8:41 AM  Other: 06/20/2016 8:41 AM    Scribe for Treatment Team: [place], LCSW 06/20/2016 8:41 AM

## 2016-06-20 NOTE — Progress Notes (Signed)
Spoke with [employer], who said they could bring the incentive spirometer over.

## 2016-06-20 NOTE — Progress Notes (Addendum)
[facility] MD Progress Note  06/20/2016 3:33 PM Brian Lucero  MRN:  [id number] Subjective: patient reports partial improvement of depressive symptoms, feels better overall, and at this time denies any suicidal ideations .  Of note, he was recently diagnosed with pneumonia, for which he received an oral antibiotic course. States that he initially felt better, but that he has again developed some coughing and some pain, described as likely pleuritic pain, reports he feels he is wheezing at times, denies dyspnea at room air. Patient is future oriented, and strongly motivated and focused on going to a rehab setting. States he feels he is at high risk of relapsing and doing poorly if he does not do so.   Objective : I have discussed case with treatment team and have met with patient. Patient reports , as above, some increased coughing and thoracic pain, discomfort over the last 1-2 days. He does not present dyspneic, does not appear to be prostrated or systemically ill, and is not febrile. No productive cough noted during our session. Of note, a follow up Chest X Ray done this AM is remarkable for persistence of pneumonia and a small new L pleural effusion. Patient visible in day room, going to some groups .  No disruptive or agitated behaviors .  Denies medication side effects.    Principal Problem: Substance Induced Psychotic Disorder Diagnosis:   Patient Active Problem List   Diagnosis Date Noted  . 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 Major depressive disorder, recurrent episode, severe, with psychosis ([place]) [F33.3] 06/12/2016  . Cocaine use disorder, severe, dependence ([place]) [F14.20] 06/12/2016  . Major depressive disorder, recurrent severe without psychotic features ([place]) [F33.2] 06/12/2016  . Seizure ([place]) [R56.9] 11/17/2011  . Tobacco abuse [Z72.0] 11/17/2011  . Alcohol abuse [F10.10] 11/17/2011  . Cocaine abuse [F14.10] 11/17/2011  . Bipolar  2 disorder ([place]) [F31.81] 11/17/2011  . Hypertension [I10] 11/17/2011  . Hydrocephalus [G91.9] 11/17/2011  . Aspiration pneumonia ([place]) [J69.0] 11/17/2011   Total Time spent with patient: 20 minutes  Past Psychiatric History: As in H&P  Past Medical History:  Past Medical History:  Diagnosis Date  . Anxiety   . Bipolar 2 disorder ([place])   . Depression   . Hypertension   . Pneumothorax    as a baby in the 67's  . Seizures ([place])    History reviewed. No pertinent surgical history. Family History:  Family History  Problem Relation Age of Onset  . Diabetes type II Mother   . Diabetes type II     Family Psychiatric  History: As in H&P Social History:  History  Alcohol Use  . 1.2 oz/week  . 2 Cans of beer per week    Comment: 1 40oz Fri. and Sat.     History  Drug Use  . Types: Cocaine    Comment: Used yesterday    Social History   Social History  . Marital status: Single    Spouse name: N/A  . Number of children: N/A  . Years of education: N/A   Social History Main Topics  . Smoking status: Current Every Day Smoker    Packs/day: 0.20    Types: Cigarettes  . Smokeless tobacco: Never Used  . Alcohol use 1.2 oz/week    2 Cans of beer per week     Comment: 1 40oz Fri. and Sat.  . Drug use: Yes    Types: Cocaine     Comment: Used yesterday  . Sexual activity:  Yes   Other Topics Concern  . None   Social History Narrative  . None   Additional Social History:   Single, lives alone. Never married. No kids. Not in any relationship. No support from his family. On disability. No legal issues. No other stressors.   Sleep: Fair  Appetite:  Fair  Current Medications: Current Facility-Administered Medications  Medication Dose Route Frequency Provider Last Rate Last Dose  . acetaminophen (TYLENOL) tablet 650 mg  650 mg Oral Q4H PRN Patrecia Pour, NP   650 mg at 06/18/16 2135  . albuterol (PROVENTIL HFA;VENTOLIN HFA) 108 (90 Base) MCG/ACT inhaler 2 puff  2 puff  Inhalation Q4H Patrecia Pour, NP   2 puff at 06/20/16 1520  . alum & mag hydroxide-simeth (MAALOX/MYLANTA) 200-200-20 MG/5ML suspension 30 mL  30 mL Oral Q6H PRN Laverle Hobby, PA-C   30 mL at 06/16/16 1859  . amLODipine (NORVASC) tablet 5 mg  5 mg Oral Daily Nanci Pina, FNP   5 mg at 06/20/16 0819  . feeding supplement (ENSURE ENLIVE) (ENSURE ENLIVE) liquid 237 mL  237 mL Oral BID BM Encarnacion Slates, NP   237 mL at 06/20/16 1520  . FLUoxetine (PROZAC) capsule 20 mg  20 mg Oral Daily Artist Beach, MD   20 mg at 06/20/16 0819  . gabapentin (NEURONTIN) capsule 200 mg  200 mg Oral BID Patrecia Pour, NP   200 mg at 06/20/16 0819  . hydrochlorothiazide (HYDRODIURIL) tablet 25 mg  25 mg Oral Daily Patrecia Pour, NP   25 mg at 06/20/16 0819  . levofloxacin (LEVAQUIN) tablet 750 mg  750 mg Oral Daily Belkys A Regalado, MD      . lisinopril (PRINIVIL,ZESTRIL) tablet 20 mg  20 mg Oral Daily Patrecia Pour, NP   20 mg at 06/20/16 0819  . magnesium hydroxide (MILK OF MAGNESIA) suspension 30 mL  30 mL Oral Daily PRN Patrecia Pour, NP      . nicotine (NICODERM CQ - dosed in mg/24 hours) patch 21 mg  21 mg Transdermal Daily Encarnacion Slates, NP   21 mg at 06/20/16 0819  . ondansetron (ZOFRAN) tablet 4 mg  4 mg Oral Q8H PRN Patrecia Pour, NP      . risperiDONE (RISPERDAL) tablet 0.5 mg  0.5 mg Oral BID Artist Beach, MD   0.5 mg at 06/20/16 0819  . traZODone (DESYREL) tablet 50 mg  50 mg Oral QHS,MR X 1 Rozetta Nunnery, NP   50 mg at 06/19/16 2133    Lab Results:  No results found for this or any previous visit (from the past 48 hour(s)).  Blood Alcohol level:  Lab Results  Component Value Date   ETH <5 06/11/2016   ETH <11 71/21/9758    Metabolic Disorder Labs: No results found for: HGBA1C, MPG Lab Results  Component Value Date   PROLACTIN 35.6 (H) 06/18/2016   Lab Results  Component Value Date   CHOL 216 (H) 06/18/2016   TRIG 45 06/18/2016   HDL 76 06/18/2016   CHOLHDL 2.8  06/18/2016   VLDL 9 06/18/2016   LDLCALC 131 (H) 06/18/2016   LDLCALC 99 07/15/2008    Physical Findings: AIMS: Facial and Oral Movements Muscles of Facial Expression: None, normal Lips and Perioral Area: None, normal Jaw: None, normal Tongue: None, normal,Extremity Movements Upper (arms, wrists, hands, fingers): None, normal Lower (legs, knees, ankles, toes): None, normal, Trunk Movements Neck, shoulders, hips: None,  normal, Overall Severity Severity of abnormal movements (highest score from questions above): None, normal Incapacitation due to abnormal movements: None, normal Patient's awareness of abnormal movements (rate only patient's report): No Awareness, Dental Status Current problems with teeth and/or dentures?: No Does patient usually wear dentures?: No  CIWA:  CIWA-Ar Total: 1 COWS:  COWS Total Score: 3  Musculoskeletal: Strength & Muscle Tone: within normal limits Gait & Station: normal Patient leans: N/A  Psychiatric Specialty Exam: Physical Exam  Constitutional: He is oriented to person, place, and time. He appears well-developed and well-nourished.  HENT:  Head: Normocephalic and atraumatic.  Eyes: Conjunctivae and EOM are normal. Pupils are equal, round, and reactive to light.  Neck: Normal range of motion.  Cardiovascular: Normal rate, regular rhythm and normal heart sounds.   Respiratory: Effort normal and breath sounds normal.  GI: Soft. Bowel sounds are normal.  Musculoskeletal: Normal range of motion.  Neurological: He is alert and oriented to person, place, and time. He has normal reflexes.  Skin: Skin is warm and dry.  Psychiatric:  As above.     ROS increased coughing, no dyspnea at room air, no fever   Blood pressure (!) 146/90, pulse (!) 111, temperature 98 F (36.7 C), temperature source Oral, resp. rate 20, height 5' 5"  (1.651 m), weight 47.6 kg (105 lb), SpO2 96 %.Body mass index is 17.47 kg/m.  General Appearance:  Improved grooming ,  pleasant on approach   Eye Contact:  Good   Speech: normal   Volume:   normal  Mood:  Reports feeling better than on admission   Affect:  Appropriate, reactive    Thought Process:  Linear and Descriptions of Associations: Intact  Orientation:  Full (Time, Place, and Person)  Thought Content: no hallucinations, no delusions  Suicidal Thoughts:  No denies any suicidal or self injurious ideations, no homicidal or violent ideations  Homicidal Thoughts:  No  Memory:  Recent and remote grossly intact   Judgement:  Improved   Insight:  Present  Psychomotor Activity:  Normal   Concentration:  Good  Recall:  Good  Fund of Knowledge:  Good  Language:  Good  Akathisia:  Negative  Handed:  R  AIMS (if indicated):     Assets:  Desire for Improvement Resilience  ADL's:  Intact  Cognition:  WNL  Sleep:  Number of Hours: 4.75   Assessment - patient improved compared to admission - improving mood, improved range of affect, no SI. At this time future oriented, and motivated in going to a rehab. A bed is available at rehab later this week, as reviewed with CSW. Of note, patient had recent pneumonia diagnosis, confirmed by [facility], and is describing exacerbation of symptoms after recent PO antibiotics . CX Ray is positive for persistent pneumonia and small pleural effusion.   Treatment Plan Summary: Treatment plan reviewed as below today 4/17.  Encourage ongoing group and milieu participation to work on coping skills and symptom reduction Continue to encourage efforts to work on sobriety and relapse prevention  Treatment team working on disposition planning  Have requested Hospitalist Consultation for management of above, persistent pneumonia  * Based on medical issues as above, will likely postpone discharge until cleared by medicine, recommended treatment initiated .  Continue Prozac 20 mgrs QDAY for depression Continue Neurontin 200 mgrs BID for mood disorder , anxiety  Continue Risperidone 0.5  mgrs BID for mood disorder, psychotic symptoms Continue Trazodone 50 mgrs QHS PRN for insomnia  * As discussed with  ID consultant, based on radiographic , clinical symptoms, low suspicion for TB at this time, PPD not indicated at this time. Patient states he had a negative PPD about 1-2 years ago.  Jenne Campus, MD 06/20/2016, 3:33 PM   Patient ID: Brian Lucero, male   DOB: [birth year], 46 y.o.   MRN: [id number]

## 2016-06-20 NOTE — Progress Notes (Signed)
Pt has been in the dayroom all evening talking with peers and watching TV.  He attended evening group and participated tonight.  He denies SI/HI/AVH at this time.  He voices no needs or concerns.  He says his medications are helping his anxiety.  He denies having any withdrawal symptoms at this time.  He tends to be flirty at times with the younger MHTs on the unit, but has been otherwise appropriate.  He makes his needs known to staff.  Support and encouragement offered.  Discharge plans are in process.  He has an appt to go to [facility] for a chest x-ray because he has been coughing frequently, and he recently had pneumonia.  Pt is aware of this appt.  Safety maintained with q15 minute checks.

## 2016-06-21 DIAGNOSIS — F332 Major depressive disorder, recurrent severe without psychotic features: Secondary | ICD-10-CM

## 2016-06-21 MED ORDER — AMLODIPINE BESYLATE 10 MG PO TABS
10.0000 mg | ORAL_TABLET | Freq: Every day | ORAL | Status: DC
Start: 2016-06-21 — End: 2016-06-26
  Administered 2016-06-21 – 2016-06-26 (×6): 10 mg via ORAL
  Filled 2016-06-21: qty 7
  Filled 2016-06-21: qty 1
  Filled 2016-06-21 (×2): qty 7
  Filled 2016-06-21 (×4): qty 1

## 2016-06-21 MED ORDER — TRAZODONE HCL 100 MG PO TABS
100.0000 mg | ORAL_TABLET | Freq: Every evening | ORAL | Status: DC | PRN
  Administered 2016-06-21 – 2016-06-25 (×5): 100 mg via ORAL
  Filled 2016-06-21 (×4): qty 1

## 2016-06-21 MED ORDER — GUAIFENESIN ER 600 MG PO TB12
1200.0000 mg | ORAL_TABLET | Freq: Two times a day (BID) | ORAL | Status: DC | PRN
  Filled 2016-06-21: qty 20

## 2016-06-21 MED ORDER — HYDROXYZINE HCL 50 MG PO TABS: 50.0000 mg | ORAL_TABLET | Freq: Four times a day (QID) | ORAL | Status: DC | PRN

## 2016-06-21 MED ORDER — TRAZODONE HCL 50 MG PO TABS: 50.0000 mg | ORAL_TABLET | Freq: Every evening | ORAL | Status: DC | PRN

## 2016-06-21 NOTE — Progress Notes (Signed)
Pt attended NA meeting. 

## 2016-06-21 NOTE — Progress Notes (Signed)
D:  Patient's self inventory sheet, patient has poor sleep, sleep medication didn't get it.   Fair appetite, low energy level, good concentration.  Rated depression and hopeless 4, anxiety 5.  Denied withdrawals.  Denied SI.  Denied physical problems.  Denied pain.  Worst pain in past 24 hours #4.  No pain medicine.  Goal is get sleep meds adjust.  Plans to talk to MD.  Does have discharge plans.   A:  Medications administered per MD orders.  Emotional support and encouragement given patient. R:  Denied SI and HI, contracts for safety.  Denied A/V hallucinations.  Safety maintained with 15 minute checks.

## 2016-06-21 NOTE — Progress Notes (Addendum)
Brian Lucero, Brian Lucero Progress Note  06/21/2016 5:31 PM Brian Lucero  MRN:  [id number] Subjective: patient reports he is feeling partially better. He ruminates about his physical health, and states he has seen a deterioration of his physical health  ( recent pneumonia) , which he attributes to cocaine abuse. States " my addiction is going to kill me if I do not stop". Focused on going to a rehab at discharge, and reports concern that returning to the community would result in relapse. At this time reports improving mood, feels less depressed . Denies any suicidal ideations .   Objective : I have discussed case with treatment team and have met with patient. Patient presents vaguely depressed, anxious, but affect is reactive and improves during session with support, empathy. As above, concerned about his physical health, and sees a connection between recent pneumonia and cocaine abuse . At this time reports improving symptoms, denies dyspnea, coughing has improved, and today does not report pleuritic pain. He does not appear to be in any acute distress and is breathing comfortably at room air. At this time denies suicidal ideations. Behavior on unit in good control. Focused on going to a rehab after discharge, worries about relapsing without rehab structure . Denies medication side effects.     Principal Problem: Substance Induced Psychotic Disorder Diagnosis:   Patient Active Problem List   Diagnosis Date Noted  . 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 Major depressive disorder, recurrent episode, severe, with psychosis ([place]) [F33.3] 06/12/2016  . Cocaine use disorder, severe, dependence ([place]) [F14.20] 06/12/2016  . Major depressive disorder, recurrent severe without psychotic features ([place]) [F33.2] 06/12/2016  . Seizure ([place]) [R56.9] 11/17/2011  . Tobacco abuse [Z72.0] 11/17/2011  . Alcohol abuse [F10.10] 11/17/2011  . Cocaine abuse [F14.10] 11/17/2011   . Bipolar 2 disorder ([place]) [F31.81] 11/17/2011  . Hypertension [I10] 11/17/2011  . Hydrocephalus [G91.9] 11/17/2011  . Aspiration pneumonia ([place]) [J69.0] 11/17/2011   Total Time spent with patient: 20 minutes  Past Psychiatric History: As in H&P  Past Medical History:  Past Medical History:  Diagnosis Date  . Anxiety   . Bipolar 2 disorder ([place])   . Depression   . Hypertension   . Pneumothorax    as a baby in the 57's  . Seizures ([place])    History reviewed. No pertinent surgical history. Family History:  Family History  Problem Relation Age of Onset  . Diabetes type II Mother   . Diabetes type II     Family Psychiatric  History: As in H&P Social History:  History  Alcohol Use  . 1.2 oz/week  . 2 Cans of beer per week    Comment: 1 40oz Fri. and Sat.     History  Drug Use  . Types: Cocaine    Comment: Used yesterday    Social History   Social History  . Marital status: Single    Spouse name: N/A  . Number of children: N/A  . Years of education: N/A   Social History Main Topics  . Smoking status: Current Every Day Smoker    Packs/day: 0.20    Types: Cigarettes  . Smokeless tobacco: Never Used  . Alcohol use 1.2 oz/week    2 Cans of beer per week     Comment: 1 40oz Fri. and Sat.  . Drug use: Yes    Types: Cocaine     Comment: Used yesterday  . Sexual activity: Yes   Other Topics Concern  .  None   Social History Narrative  . None   Additional Social History:   Single, lives alone. Never married. No kids. Not in any relationship. No support from his family. On disability. No legal issues. No other stressors.   Sleep: Fair states he is not sleeping well in spite of current Trazodone dose   Appetite:  improving   Current Medications: Current Facility-Administered Medications  Medication Dose Route Frequency Provider Last Rate Last Dose  . acetaminophen (TYLENOL) tablet 650 mg  650 mg Oral Q4H PRN Patrecia Pour, NP   650 mg at 06/21/16 0630  .  albuterol (PROVENTIL HFA;VENTOLIN HFA) 108 (90 Base) MCG/ACT inhaler 2 puff  2 puff Inhalation Q4H Patrecia Pour, NP   2 puff at 06/21/16 1700  . alum & mag hydroxide-simeth (MAALOX/MYLANTA) 200-200-20 MG/5ML suspension 30 mL  30 mL Oral Q6H PRN Laverle Hobby, PA-C   30 mL at 06/16/16 1859  . amLODipine (NORVASC) tablet 10 mg  10 mg Oral Daily Mariel Aloe, Lucero   10 mg at 06/21/16 0755  . feeding supplement (ENSURE ENLIVE) (ENSURE ENLIVE) liquid 237 mL  237 mL Oral BID BM Encarnacion Slates, NP   237 mL at 06/21/16 1429  . FLUoxetine (PROZAC) capsule 20 mg  20 mg Oral Daily Artist Beach, Lucero   20 mg at 06/21/16 0755  . gabapentin (NEURONTIN) capsule 200 mg  200 mg Oral BID Patrecia Pour, NP   200 mg at 06/21/16 1710  . guaiFENesin (MUCINEX) 12 hr tablet 1,200 mg  1,200 mg Oral BID PRN Mariel Aloe, Lucero      . hydrochlorothiazide (HYDRODIURIL) tablet 25 mg  25 mg Oral Daily Patrecia Pour, NP   25 mg at 06/21/16 0756  . hydrOXYzine (ATARAX/VISTARIL) tablet 50 mg  50 mg Oral Q6H PRN Laverle Hobby, PA-C      . levofloxacin (LEVAQUIN) tablet 750 mg  750 mg Oral Daily Belkys A Regalado, Lucero   750 mg at 06/21/16 0756  . lisinopril (PRINIVIL,ZESTRIL) tablet 20 mg  20 mg Oral Daily Patrecia Pour, NP   20 mg at 06/21/16 0756  . magnesium hydroxide (MILK OF MAGNESIA) suspension 30 mL  30 mL Oral Daily PRN Patrecia Pour, NP      . nicotine (NICODERM CQ - dosed in mg/24 hours) patch 21 mg  21 mg Transdermal Daily Encarnacion Slates, NP   21 mg at 06/21/16 0757  . ondansetron (ZOFRAN) tablet 4 mg  4 mg Oral Q8H PRN Patrecia Pour, NP      . risperiDONE (RISPERDAL) tablet 0.5 mg  0.5 mg Oral BID Artist Beach, Lucero   0.5 mg at 06/21/16 1710  . traZODone (DESYREL) tablet 100 mg  100 mg Oral QHS PRN Jenne Campus, Lucero        Lab Results:  Results for orders placed or performed during the Lucero encounter of 06/12/16 (from the past 48 hour(s))  CBC with Differential/Platelet     Status: Abnormal    Collection Time: 06/20/16  6:19 PM  Result Value Ref Range   WBC 10.4 4.0 - 10.5 K/uL   RBC 3.73 (L) 4.22 - 5.81 MIL/uL   Hemoglobin 11.2 (L) 13.0 - 17.0 g/dL   HCT 32.9 (L) 39.0 - 52.0 %   MCV 88.2 78.0 - 100.0 fL   MCH 30.0 26.0 - 34.0 pg   MCHC 34.0 30.0 - 36.0 g/dL   RDW 14.5 11.5 -  15.5 %   Platelets 223 150 - 400 K/uL   Neutrophils Relative % 68 %   Neutro Abs 7.0 1.7 - 7.7 K/uL   Lymphocytes Relative 19 %   Lymphs Abs 2.0 0.7 - 4.0 K/uL   Monocytes Relative 9 %   Monocytes Absolute 1.0 0.1 - 1.0 K/uL   Eosinophils Relative 4 %   Eosinophils Absolute 0.4 0.0 - 0.7 K/uL   Basophils Relative 0 %   Basophils Absolute 0.0 0.0 - 0.1 K/uL    Comment: Performed at [facility], [place] [address], [place], [place] [address]  Basic metabolic panel     Status: Abnormal   Collection Time: 06/20/16  6:19 PM  Result Value Ref Range   Sodium 137 135 - 145 mmol/L   Potassium 3.9 3.5 - 5.1 mmol/L   Chloride 99 (L) 101 - 111 mmol/L   CO2 28 22 - 32 mmol/L   Glucose, Bld 94 65 - 99 mg/dL   BUN 24 (H) 6 - 20 mg/dL   Creatinine, Ser 0.98 0.61 - 1.24 mg/dL   Calcium 9.3 8.9 - 10.3 mg/dL   GFR calc non Af Amer >60 >60 mL/min   GFR calc Af Amer >60 >60 mL/min    Comment: (NOTE) The eGFR has been calculated using the CKD EPI equation. This calculation has not been validated in all clinical situations. eGFR's persistently <60 mL/min signify possible Chronic Kidney Disease.    Anion gap 10 5 - 15    Comment: Performed at [facility], [place] [address], [place], [place] [address]    Blood Alcohol level:  Lab Results  Component Value Date   [facility] <5 06/11/2016   ETH <11 16/57/9038    Metabolic Disorder Labs: No results found for: HGBA1C, MPG Lab Results  Component Value Date   PROLACTIN 35.6 (H) 06/18/2016   Lab Results  Component Value Date   CHOL 216 (H) 06/18/2016   TRIG 45 06/18/2016   HDL 76 06/18/2016   CHOLHDL 2.8 06/18/2016   VLDL 9  06/18/2016   LDLCALC 131 (H) 06/18/2016   LDLCALC 99 07/15/2008    Physical Findings: AIMS: Facial and Oral Movements Muscles of Facial Expression: None, normal Lips and Perioral Area: None, normal Jaw: None, normal Tongue: None, normal,Extremity Movements Upper (arms, wrists, hands, fingers): None, normal Lower (legs, knees, ankles, toes): None, normal, Trunk Movements Neck, shoulders, hips: None, normal, Overall Severity Severity of abnormal movements (highest score from questions above): None, normal Incapacitation due to abnormal movements: None, normal Patient's awareness of abnormal movements (rate only patient's report): No Awareness, Dental Status Current problems with teeth and/or dentures?: No Does patient usually wear dentures?: No  CIWA:  CIWA-Ar Total: 1 COWS:  COWS Total Score: 2  Musculoskeletal: Strength & Muscle Tone: within normal limits Gait & Station: normal Patient leans: N/A  Psychiatric Specialty Exam: Physical Exam  Constitutional: He is oriented to person, place, and time. He appears well-developed and well-nourished.  HENT:  Head: Normocephalic and atraumatic.  Eyes: Conjunctivae and EOM are normal. Pupils are equal, round, and reactive to light.  Neck: Normal range of motion.  Cardiovascular: Normal rate, regular rhythm and normal heart sounds.   Respiratory: Effort normal and breath sounds normal.  GI: Soft. Bowel sounds are normal.  Musculoskeletal: Normal range of motion.  Neurological: He is alert and oriented to person, place, and time. He has normal reflexes.  Skin: Skin is warm and dry.  Psychiatric:  As above.  ROS less coughing, less chest/pleuritic pain, no dyspnea, no fever or chills   Blood pressure (!) 145/83, pulse (!) 103, temperature 98.6 F (37 C), temperature source Oral, resp. rate 18, height _0  (1.651 m), weight 47.6 kg (105 lb), SpO2 99 %.Body mass index is 17.47 kg/m.  General Appearance:  Improved grooming    Eye  Contact: good   Speech: normal   Volume:   Normal   Mood:  Improving , less depressed   Affect:  Mildly constricted, but reactive, anxious    Thought Process:  Goal Directed and Descriptions of Associations: Intact  Orientation:  Other:  fully alert and attentive  Thought Content: no psychotic symptoms, not internally preoccupied   Suicidal Thoughts:  No denies any suicidal or self injurious ideations, no homicidal or violent ideations  Homicidal Thoughts:  No  Memory:  Recent and remote grossly intact   Judgement:   Improving    Insight:   Improving   Psychomotor Activity: normal, visible on unit   Concentration:  Good   Recall:  Good   Fund of Knowledge:  Good   Language:  Good  Akathisia:  No  Handed:  Right   AIMS (if indicated):     Assets:  Communication Skills Desire for Improvement Resilience  ADL's:  Intact  Cognition:  WNL  Sleep:  Number of Hours: 5.5   Assessment - patient presenting with gradually improving mood and range of affect. At this time tolerating medications well, including Levaquin course for pneumonia. Coughing and ( likely pleuritic) pain improved. At this time no fever, does not appear acutely ill or uncomfortable and no dyspnea at room air. Reports fair sleep- denies side effects on Trazodone . No suicidal ideations.  Worries mostly about risk of relapse if returning to community, and  wants to be discharged to a rehab.  Treatment Plan Summary: Treatment plan reviewed as below today 4/17.  Encourage ongoing group and milieu participation to work on Radiographer, therapeutic and symptom reduction Continue to encourage efforts to work on sobriety and relapse prevention  Treatment team working on Lawyer Consultation , management  Continue Prozac 20 mgrs QDAY for depression Continue Neurontin 200 mgrs BID for mood disorder , anxiety  Continue Risperidone 0.5 mgrs BID for mood disorder, psychotic symptoms Increase  Trazodone  to 100  mgrs QHS PRN for insomnia  Monitor EKG ( to check QTc, as on SSRI/  Levaquin course )   Jenne Campus, Lucero 06/21/2016, 5:31 PM   Patient ID: Brian Lucero, male   DOB: Oct 30, 1970, 46 y.o.   MRN: [id number]

## 2016-06-21 NOTE — Progress Notes (Signed)
Recreation Therapy Notes  Date: 06/21/16 Time: 0930 Location: [address] Dayroom  Group Topic: Stress Management  Goal Area(s) Addresses:  Patient will verbalize importance of using healthy stress management.  Patient will identify positive emotions associated with healthy stress management.   Intervention: Stress Management  Activity :  Meditation.  LRT introduced the stress management technique of meditation.  LRT played Lucero meditation on choice from the Calm app to guide patients through the meditation.  Patients were to follow along as the meditation was played to engage in the technique.  Education:  Stress Management, Discharge Planning.   Education Outcome: Acknowledges edcuation/In group clarification offered/Needs additional education  Clinical Observations/Feedback: Pt did not attend group.   Brian Lucero, LRT/CTRS         Brian Lucero 06/21/2016 12:50 PM 

## 2016-06-21 NOTE — Progress Notes (Signed)
Pt attend wrap up group. His day was a 8 1/2. His goal was to make plans for xray. The reason why he has cough no findings. His discharge date Monday he plans to hang around until he is full discharge.

## 2016-06-21 NOTE — [facility] Group Notes (Signed)
[facility] LCSW Group Therapy 06/21/2016 1:15pm  Type of Therapy and Topic: Group Therapy: Avoiding Self-Sabotaging and Enabling Behaviors   Participation Level: Active  Description of Group:  Learn how to identify obstacles, self-sabotaging and enabling behaviors, what are they, why do we do them and what needs do these behaviors meet? Discuss unhealthy relationships and how to have positive healthy boundaries with those that sabotage and enable. Explore aspects of self-sabotage and enabling in yourself and how to limit these self-destructive behaviors in everyday life. A scaling question is used to help patient look at where they are now in their motivation to change, from 1 to 10 (lowest to highest motivation).   Therapeutic Goals:  1. Patient will identify one obstacle that relates to self-sabotage and enabling behaviors 2. Patient will identify one personal self-sabotaging or enabling behavior they did prior to admission 3. Patient able to establish a plan to change the above identified behavior they did prior to admission:  4. Patient will demonstrate ability to communicate their needs through discussion and/or role plays.  Summary of Patient Progress:  Pt recognized his tendency to self-sabotage and attempts to manipulate others to get what he needs. Pt reports that he has difficulty taking compliments because he does not believe those things about himself. Pt was encouraging to other peers and was forthcoming with participation.   Therapeutic Modalities:  Cognitive Behavioral Therapy  Person-Centered Therapy  Motivational Interviewing   Vernie Shanks, LCSW 06/21/2016 5:05 PM

## 2016-06-21 NOTE — Plan of Care (Signed)
Problem: Activity: Goal: Interest or engagement in activities will improve Outcome: Progressing Patient is attending groups and following therapeutic regimen without issues or concerns.

## 2016-06-21 NOTE — Tx Team (Signed)
Interdisciplinary Treatment and Diagnostic Plan Update  06/21/2016 Time of Session: 0930 Kallin Henk MRN: [id number]  Principal Diagnosis: MDD  Secondary Diagnoses: Principal Problem:   Substance-induced psychotic disorder with hallucinations (HCC) Active Problems:   Major depressive disorder, recurrent severe without psychotic features (HCC)   Current Medications:  Current Facility-Administered Medications  Medication Dose Route Frequency Provider Last Rate Last Dose  . acetaminophen (TYLENOL) tablet 650 mg  650 mg Oral Q4H PRN Charm Rings, NP   650 mg at 06/21/16 0630  . albuterol (PROVENTIL HFA;VENTOLIN HFA) 108 (90 Base) MCG/ACT inhaler 2 puff  2 puff Inhalation Q4H Charm Rings, NP   2 puff at 06/21/16 1207  . alum & mag hydroxide-simeth (MAALOX/MYLANTA) 200-200-20 MG/5ML suspension 30 mL  30 mL Oral Q6H PRN Kerry Hough, PA-C   30 mL at 06/16/16 1859  . amLODipine (NORVASC) tablet 10 mg  10 mg Oral Daily Narda Bonds, MD   10 mg at 06/21/16 0755  . feeding supplement (ENSURE ENLIVE) (ENSURE ENLIVE) liquid 237 mL  237 mL Oral BID BM Sanjuana Kava, NP   237 mL at 06/21/16 1429  . FLUoxetine (PROZAC) capsule 20 mg  20 mg Oral Daily Georgiann Cocker, MD   20 mg at 06/21/16 0755  . gabapentin (NEURONTIN) capsule 200 mg  200 mg Oral BID Charm Rings, NP   200 mg at 06/21/16 0755  . guaiFENesin (MUCINEX) 12 hr tablet 1,200 mg  1,200 mg Oral BID PRN Narda Bonds, MD      . hydrochlorothiazide (HYDRODIURIL) tablet 25 mg  25 mg Oral Daily Charm Rings, NP   25 mg at 06/21/16 0756  . hydrOXYzine (ATARAX/VISTARIL) tablet 50 mg  50 mg Oral Q6H PRN Kerry Hough, PA-C      . levofloxacin (LEVAQUIN) tablet 750 mg  750 mg Oral Daily Belkys A Regalado, MD   750 mg at 06/21/16 0756  . lisinopril (PRINIVIL,ZESTRIL) tablet 20 mg  20 mg Oral Daily Charm Rings, NP   20 mg at 06/21/16 0756  . magnesium hydroxide (MILK OF MAGNESIA) suspension 30 mL  30 mL Oral Daily PRN Charm Rings, NP      . nicotine (NICODERM CQ - dosed in mg/24 hours) patch 21 mg  21 mg Transdermal Daily Sanjuana Kava, NP   21 mg at 06/21/16 0757  . ondansetron (ZOFRAN) tablet 4 mg  4 mg Oral Q8H PRN Charm Rings, NP      . risperiDONE (RISPERDAL) tablet 0.5 mg  0.5 mg Oral BID Georgiann Cocker, MD   0.5 mg at 06/21/16 0756  . traZODone (DESYREL) tablet 50 mg  50 mg Oral QHS PRN,MR X 1 Spencer E Simon, PA-C       PTA Medications: Prescriptions Prior to Admission  Medication Sig Dispense Refill Last Dose  . divalproex (DEPAKOTE) 500 MG DR tablet Take 500 mg by mouth 2 (two) times daily.   Past Week at Unknown time  . hydrochlorothiazide (HYDRODIURIL) 25 MG tablet Take 1 tablet (25 mg total) by mouth daily. 30 tablet 0 Past Week at Unknown time  . [EXPIRED] levofloxacin (LEVAQUIN) 750 MG tablet Take 1 tablet (750 mg total) by mouth daily. X 7 days 6 tablet 0   . lisinopril (PRINIVIL,ZESTRIL) 20 MG tablet Take 1 tablet (20 mg total) by mouth daily. 30 tablet 0 Past Week at Unknown time  . sertraline (ZOLOFT) 100 MG tablet Take 100 mg by mouth  3 (three) times daily.   Past Week at Unknown time    Patient Stressors: Substance abuse  Patient Strengths: Wellsite geologist fund of knowledge  Treatment Modalities: Medication Management, Group therapy, Case management,  1 to 1 session with clinician, Psychoeducation, Recreational therapy.   Physician Treatment Plan for Primary Diagnosis: MDD  Medication Management: Evaluate patient's response, side effects, and tolerance of medication regimen.  Therapeutic Interventions: 1 to 1 sessions, Unit Group sessions and Medication administration.  Evaluation of Outcomes: Progressing  Physician Treatment Plan for Secondary Diagnosis: Principal Problem:   Substance-induced psychotic disorder with hallucinations (HCC) Active Problems:   Major depressive disorder, recurrent severe without psychotic features (HCC)  Long Term Goal(s):  Improvement in symptoms so as ready for discharge Improvement in symptoms so as ready for discharge   Short Term Goals: Ability to identify changes in lifestyle to reduce recurrence of condition will improve Ability to verbalize feelings will improve Ability to disclose and discuss suicidal ideas Ability to demonstrate self-control will improve Ability to identify and develop effective coping behaviors will improve Compliance with prescribed medications will improve Ability to identify triggers associated with substance abuse/mental health issues will improve     Medication Management: Evaluate patient's response, side effects, and tolerance of medication regimen.  Therapeutic Interventions: 1 to 1 sessions, Unit Group sessions and Medication administration.  Evaluation of Outcomes: Progressing   RN Treatment Plan for Primary Diagnosis: MDD Long Term Goal(s): Knowledge of disease and therapeutic regimen to maintain health will improve  Short Term Goals: Ability to remain free from injury will improve, Ability to verbalize feelings will improve and Ability to disclose and discuss suicidal ideas  Medication Management: RN will administer medications as ordered by provider, will assess and evaluate patient's response and provide education to patient for prescribed medication. RN will report any adverse and/or side effects to prescribing provider.  Therapeutic Interventions: 1 on 1 counseling sessions, Psychoeducation, Medication administration, Evaluate responses to treatment, Monitor vital signs and CBGs as ordered, Perform/monitor CIWA, COWS, AIMS and Fall Risk screenings as ordered, Perform wound care treatments as ordered.  Evaluation of Outcomes: Progressing   LCSW Treatment Plan for Primary Diagnosis: MDD Long Term Goal(s): Safe transition to appropriate next level of care at discharge, Engage patient in therapeutic group addressing interpersonal concerns.  Short Term Goals:  Engage patient in aftercare planning with referrals and resources, Facilitate patient progression through stages of change regarding substance use diagnoses and concerns and Identify triggers associated with mental health/substance abuse issues  Therapeutic Interventions: Assess for all discharge needs, 1 to 1 time with Social worker, Explore available resources and support systems, Assess for adequacy in community support network, Educate family and significant other(s) on suicide prevention, Complete Psychosocial Assessment, Interpersonal group therapy.  Evaluation of Outcomes: Progressing   Progress in Treatment: Attending groups: Yes. Participating in groups: Yes. Taking medication as prescribed: Yes. Toleration medication: Yes. Family/Significant other contact made: SPE completed with pt; pt declines to consent to family contact.  Patient understands diagnosis: Yes. Discussing patient identified problems/goals with staff: Yes. Medical problems stabilized or resolved: Yes. Denies suicidal/homicidal ideation: yes, per self report.  Issues/concerns per patient self-inventory: No. Other: n/a  New problem(s) identified: No, Describe:  n/a  New Short Term/Long Term Goal(s): detox; elimination of AH/SI, medication stabilization; development of comprehensive mental wellness/sobriety plan.   Discharge Plan or Barriers: Pt has a screening for possible admission rescheduled for Monday at 745AM. Screening rescheduled due to pneumonia diagnosis on 4/17; MD chose to  cancel discharge due to pneumonia/chest xray.   Reason for Continuation of Hospitalization: Depression Medication stabilization Withdrawal symptoms  Estimated Length of Stay: 3-4 days (likely discharge on Monday).   Attendees: Patient: 06/21/2016 2:41 PM  Physician: Dr. Jama Flavors MD 06/21/2016 2:41 PM  Nursing: Everardo Beals RN 06/21/2016 2:41 PM  RN Care Manager: Onnie Boer CM 06/21/2016 2:41 PM  Social Worker: [name],  LCSW 06/21/2016 2:41 PM  Recreational Therapist: Juliann Pares 06/21/2016 2:41 PM  Other: Armandina Stammer NP 06/21/2016 2:41 PM  Other:  06/21/2016 2:41 PM  Other: 06/21/2016 2:41 PM    Scribe for Treatment Team: Ledell Peoples Smart, LCSW 06/21/2016 2:41 PM

## 2016-06-21 NOTE — Progress Notes (Signed)
Nursing Progress Note 7p-7a  D) Patient presents pleasant and cooperative. Patient is seen interactive in the milieu. Patient denies shortness of breath. Patient denies SI/HI/AVH. Patient complains of teeth pain. Patient contracts for safety on the unit.   A) Emotional support given. 1:1 interaction and active listening provided. Patient medicated with PM orders as prescribed. Medications reviewed with patient. Patient verbalized understanding of medications without further questions.  Snacks and fluids provided. Opportunities for questions or concerns presented to patient. Patient encouraged to continue to work on treatment goals. Labs, vital signs and patient behavior monitored throughout shift. Patient safety maintained with q15 min safety checks. Patient instructed to use incentive spirometer q hour.   R) Patient verbalized understanding and reported use of incentive spirometer. Patient receptive to interaction with nurse. Patient remains safe on the unit at this time. Patient denies any adverse medication reactions at this time. Patient is resting in bed without complaints. Will continue to monitor.

## 2016-06-21 NOTE — Progress Notes (Signed)
PROGRESS NOTE    Brian Lucero  ZOX:[id number] DOB: October 26, 1970 DOA: 06/12/2016 PCP: Pcp Not In System   Brief Narrative: Brian Lucero is a 46 y.o. male with PMH significant for HTN, Drug abuse (cocaine, alcohol), Seizure, Bipolar. He was admitted to behavioral health for hallucinations and worsening depression. Medicine consult for recurrent pneumonia.   Assessment & Plan:   Principal Problem:   Substance-induced psychotic disorder with hallucinations (HCC) Active Problems:   Major depressive disorder, recurrent severe without psychotic features (HCC)   Recurrent pneumonia Symptoms resolved. On room air. -continue Levaquin (End date 4/22) -start Mucinex  Essential hypertension Uncontrolled on Norvasc, lisinopril and hydrochlorothiazide. -increase to amlodipine  daily -continue lisinopril and hydrochlorothiazide  Recommendations: Continue for a 5 total day dose of Levaquin. Follow-up chest x-ray in 2-3 weeks to ensure resolution. Continue current antihypertensive regimen as ordered. Medicine will sign off. Please re-consult at any time. Thanks!   Antimicrobials:  Levaquin    Subjective: Patient reports improvement of left-sided pleuritic chest pain with no dyspnea. He has mild sputum production that has improved since yesterday. Afebrile.  Objective: Vitals:   06/20/16 0948 06/20/16 1552 06/21/16 0618 06/21/16 0619  BP: (!) 146/90  (!) 142/92 (!) 149/97  Pulse: (!) 111  92 98  Resp:   18   Temp:  98.4 F (36.9 C) 97.8 F (36.6 C)   TempSrc:  Oral Oral   SpO2:  99%    Weight:      Height:       No intake or output data in the 24 hours ending 06/21/16 0712 Filed Weights   06/12/16 1530  Weight: 47.6 kg (105 lb)    Examination:  General exam: Appears calm and comfortable Respiratory system: Left lower lobe crackels. Respiratory effort normal. Cardiovascular system: S1 & S2 heard, RRR. No murmurs, rubs, gallops or clicks. Gastrointestinal system:  Abdomen is nondistended, soft and nontender. Normal bowel sounds heard. Central nervous system: Alert and oriented. No focal neurological deficits. Extremities: No edema. No calf tenderness. Left upper extremity deformity    Data Reviewed: I have personally reviewed following labs and imaging studies  CBC:  Recent Labs Lab 06/20/16 1819  WBC 10.4  NEUTROABS 7.0  HGB 11.2*  HCT 32.9*  MCV 88.2  PLT 223   Basic Metabolic Panel:  Recent Labs Lab 06/20/16 1819  NA 137  K 3.9  CL 99*  CO2 28  GLUCOSE 94  BUN 24*  CREATININE 0.98  CALCIUM 9.3   GFR: Estimated Creatinine Clearance: 63.4 mL/min (by C-G formula based on SCr of 0.98 mg/dL).    Radiology Studies: Dg Chest 2 View  Result Date: 06/20/2016 CLINICAL DATA:  Follow-up of left-sided pneumonia. EXAM: CHEST  2 VIEW COMPARISON:  Chest x-ray of June 11, 2016 FINDINGS: The lungs are well-expanded. There is persistent infiltrate at the left lung base. There is a new small left pleural effusion. No definite infiltrate is observed elsewhere. The heart is normal in size. The central pulmonary vascularity is prominent. There is no definite interstitial edema. There is calcification in the wall of the aortic arch. The mediastinum is normal in width. The bony thorax exhibits no acute abnormality. IMPRESSION: Underlying COPD or reactive airway disease. Persistent pneumonia in the left lower lobe. New small left pleural effusion. An additional follow-up chest x-ray in 2-3 weeks is recommended to assure clearing. Thoracic aortic atherosclerosis. Electronically Signed   By: David  [place] M.D.   On: 06/20/2016 13:03  Scheduled Meds: . albuterol  2 puff Inhalation Q4H  . amLODipine  5 mg Oral Daily  . feeding supplement (ENSURE ENLIVE)  237 mL Oral BID BM  . FLUoxetine  20 mg Oral Daily  . gabapentin  200 mg Oral BID  . hydrochlorothiazide  25 mg Oral Daily  . levofloxacin  750 mg Oral Daily  . lisinopril  20 mg Oral Daily   . nicotine  21 mg Transdermal Daily  . risperiDONE  0.5 mg Oral BID   Continuous Infusions:   LOS: 9 days     Jacquelin Hawking, MD [facility] 06/21/2016, 7:12 AM Pager: [phone]  If 7PM-7AM, please contact night-coverage [email or web address] Password [facility] 06/21/2016, 7:12 AM

## 2016-06-21 NOTE — Plan of Care (Signed)
Problem: Education: Goal: Knowledge of disease or condition will improve Outcome: Progressing Nurse discussed depression/anxiety/coping skills with patient.    

## 2016-06-21 NOTE — Progress Notes (Addendum)
EKG completed and put on MD desk for review.  Patient stated he has used the incentive spirometer several times throughout the day and plans to use the spirometer tonight.

## 2016-06-22 MED ORDER — RISPERIDONE 1 MG PO TABS
1.0000 mg | ORAL_TABLET | Freq: Every day | ORAL | Status: DC
  Administered 2016-06-22 – 2016-06-25 (×4): 1 mg via ORAL
  Filled 2016-06-22 (×7): qty 1

## 2016-06-22 MED ORDER — RISPERIDONE 0.5 MG PO TABS
0.5000 mg | ORAL_TABLET | ORAL | Status: DC
  Administered 2016-06-23 – 2016-06-26 (×4): 0.5 mg via ORAL
  Filled 2016-06-22 (×6): qty 1

## 2016-06-22 NOTE — Progress Notes (Signed)
DAR NOTE: Patient presents with calm affect and cooperative.  No acute respiratory distress noted or reported.  Denies suicidal thoughts, auditory and visual hallucinations.  Described energy level as normal and concentration as good.  Rates depression at 3, hopelessness at 2, and anxiety at 4.  Maintained on routine safety checks.  Medications given as prescribed.  Support and encouragement offered as needed.  Attended group and participated.  States goal for today is "to get my breathing exercise done as directed."  Patient observed socializing with peers in the dayroom.  Offered no complaint.

## 2016-06-22 NOTE — Progress Notes (Signed)
Patient ID: Brian Lucero, male   DOB: [birth year], 46 y.o.   MRN: [id number]  Pt currently presents with an appropriate affect and cooperative behavior. Pt reports to Clinical research associate that his day was "a good one." Pt attends karaoke group tonight, interacts positively with peers. Pt reports good sleep with current medication regimen. Pt reports "I want to help others on the hall", easily enmeshed in others emotions.   Pt provided with medications per providers orders. Pt's labs and vitals were monitored throughout the night. Pt given a 1:1 about emotional and mental status. Pt supported and encouraged to express concerns and questions. Pt educated on medications.  Pt's safety ensured with 15 minute and environmental checks. Pt endorses passive SI, no plan. Pt currently denies HI and A/V hallucinations. Pt verbally agrees to seek staff if SI worsens, HI or A/VH occurs and to consult with staff before acting on any harmful thoughts. Will continue POC.

## 2016-06-22 NOTE — [facility] Group Notes (Signed)
[facility] LCSW Group Therapy 06/22/2016 1:15 PM  Type of Therapy: Group Therapy- Feelings about Diagnosis  Participation Level: Active   Participation Quality:  Appropriate  Affect:  Appropriate  Cognitive: Alert and Oriented   Insight:  Developing   Engagement in Therapy: Developing/Improving and Engaged   Modes of Intervention: Clarification, Confrontation, Discussion, Education, Exploration, Limit-setting, Orientation, Problem-solving, Rapport Building, Dance movement psychotherapist, Socialization and Support  Description of Group:   This group will allow patients to explore their thoughts and feelings about diagnoses they have received. Patients will be guided to explore their level of understanding and acceptance of these diagnoses. Facilitator will encourage patients to process their thoughts and feelings about the reactions of others to their diagnosis, and will guide patients in identifying ways to discuss their diagnosis with significant others in their lives. This group will be process-oriented, with patients participating in exploration of their own experiences as well as giving and receiving support and challenge from other group members.  Summary of Progress/Problems:  Pt was more reserved in group discussion today but remained attentive and offered feedback to peers.   Therapeutic Modalities:   Cognitive Behavioral Therapy Solution Focused Therapy Motivational Interviewing Relapse Prevention Therapy  Vernie Shanks, LCSW 06/22/2016 3:18 PM

## 2016-06-22 NOTE — Progress Notes (Signed)
[facility] MD Progress Note  06/22/2016 5:49 PM Brian Lucero  MRN:  [id number] Subjective: he reports he continues to feel better than he did on admission. He reports improving pulmonary symptoms- Brian discomfort on inhaling deeply, Brian pain, Brian cough, and currently does not endorse dyspnea. No fever. States medications are helping , denies medication side effects .    Objective : I have discussed case with treatment team and have met with patient. Patient presents with improvement compared to admission. As above, at this time not endorsing persistent cough, no dyspnea, much improved thoracic pain, and no fever. Tolerating Levaquin antibiotic course well . Denies medication side effects. Patient had reported some vague auditory hallucinations on admission- states these mostly resolved, but still occasionally hears " whispers ". At this time not internally preoccupied , no delusions expressed . States Risperidone helping and well tolerated and wants to increase dose .  EKG - QTc - 472.  No disruptive or agitated behaviors on unit, going to some groups   Principal Problem: Substance Induced Psychotic Disorder Diagnosis:   Patient Active Problem List   Diagnosis Date Noted  . Substance-induced psychotic disorder with hallucinations ([place]) [F19.951] 06/14/2016  . Major depressive disorder, recurrent episode, severe, with psychosis ([place]) [F33.3] 06/12/2016  . Cocaine use disorder, severe, dependence ([place]) [F14.20] 06/12/2016  . Major depressive disorder, recurrent severe without psychotic features ([place]) [F33.2] 06/12/2016  . Seizure ([place]) [R56.9] 11/17/2011  . Tobacco abuse [Z72.0] 11/17/2011  . Alcohol abuse [F10.10] 11/17/2011  . Cocaine abuse [F14.10] 11/17/2011  . Bipolar 2 disorder ([place]) [F31.81] 11/17/2011  . Hypertension [I10] 11/17/2011  . Hydrocephalus [G91.9] 11/17/2011  . Aspiration pneumonia ([place]) [J69.0] 11/17/2011   Total Time spent with patient: 20 minutes  Past Psychiatric  History: As in H&P  Past Medical History:  Past Medical History:  Diagnosis Date  . Anxiety   . Bipolar 2 disorder ([place])   . Depression   . Hypertension   . Pneumothorax    as a baby in the 87's  . Seizures ([place])    History reviewed. No pertinent surgical history. Family History:  Family History  Problem Relation Age of Onset  . Diabetes type II Mother   . Diabetes type II     Family Psychiatric  History: As in H&P Social History:  History  Alcohol Use  . 1.2 oz/week  . 2 Cans of beer per week    Comment: 1 40oz Fri. and Sat.     History  Drug Use  . Types: Cocaine    Comment: Used yesterday    Social History   Social History  . Marital status: Single    Spouse name: N/A  . Number of children: N/A  . Years of education: N/A   Social History Main Topics  . Smoking status: Current Every Day Smoker    Packs/day: 0.20    Types: Cigarettes  . Smokeless tobacco: Never Used  . Alcohol use 1.2 oz/week    2 Cans of beer per week     Comment: 1 40oz Fri. and Sat.  . Drug use: Yes    Types: Cocaine     Comment: Used yesterday  . Sexual activity: Yes   Other Topics Concern  . None   Social History Narrative  . None   Additional Social History:   Single, lives alone. Never married. No kids. Not in any relationship. No support from his family. On disability. No legal issues. No other stressors.   Sleep: fair- improving  Appetite:  Good  Current Medications: Current Facility-Administered Medications  Medication Dose Route Frequency Provider Last Rate Last Dose  . acetaminophen (TYLENOL) tablet 650 mg  650 mg Oral Q4H PRN Patrecia Pour, NP   650 mg at 06/21/16 2200  . albuterol (PROVENTIL HFA;VENTOLIN HFA) 108 (90 Base) MCG/ACT inhaler 2 puff  2 puff Inhalation Q4H Patrecia Pour, NP   2 puff at 06/22/16 1721  . alum & mag hydroxide-simeth (MAALOX/MYLANTA) 200-200-20 MG/5ML suspension 30 mL  30 mL Oral Q6H PRN Laverle Hobby, PA-C   30 mL at 06/16/16 1859   . amLODipine (NORVASC) tablet 10 mg  10 mg Oral Daily Mariel Aloe, MD   10 mg at 06/22/16 7619  . feeding supplement (ENSURE ENLIVE) (ENSURE ENLIVE) liquid 237 mL  237 mL Oral BID BM Encarnacion Slates, NP   237 mL at 06/22/16 1428  . FLUoxetine (PROZAC) capsule 20 mg  20 mg Oral Daily Artist Beach, MD   20 mg at 06/22/16 0811  . gabapentin (NEURONTIN) capsule 200 mg  200 mg Oral BID Patrecia Pour, NP   200 mg at 06/22/16 1720  . guaiFENesin (MUCINEX) 12 hr tablet 1,200 mg  1,200 mg Oral BID PRN Mariel Aloe, MD      . hydrochlorothiazide (HYDRODIURIL) tablet 25 mg  25 mg Oral Daily Patrecia Pour, NP   25 mg at 06/22/16 5093  . hydrOXYzine (ATARAX/VISTARIL) tablet 50 mg  50 mg Oral Q6H PRN Laverle Hobby, PA-C      . levofloxacin [facility]) tablet 750 mg  750 mg Oral Daily Belkys A Regalado, MD   750 mg at 06/22/16 0811  . lisinopril (PRINIVIL,ZESTRIL) tablet 20 mg  20 mg Oral Daily Patrecia Pour, NP   20 mg at 06/22/16 0811  . magnesium hydroxide (MILK OF MAGNESIA) suspension 30 mL  30 mL Oral Daily PRN Patrecia Pour, NP      . nicotine (NICODERM CQ - dosed in mg/24 hours) patch 21 mg  21 mg Transdermal Daily Encarnacion Slates, NP   21 mg at 06/22/16 0800  . ondansetron (ZOFRAN) tablet 4 mg  4 mg Oral Q8H PRN Patrecia Pour, NP      . risperiDONE (RISPERDAL) tablet 0.5 mg  0.5 mg Oral BID Artist Beach, MD   0.5 mg at 06/22/16 1720  . traZODone (DESYREL) tablet 100 mg  100 mg Oral QHS PRN Jenne Campus, MD   100 mg at 06/21/16 2200    Lab Results:  Results for orders placed or performed during the hospital encounter of 06/12/16 (from the past 48 hour(s))  CBC with Differential/Platelet     Status: Abnormal   Collection Time: 06/20/16  6:19 PM  Result Value Ref Range   WBC 10.4 4.0 - 10.5 K/uL   RBC 3.73 (L) 4.22 - 5.81 MIL/uL   Hemoglobin 11.2 (L) 13.0 - 17.0 g/dL   HCT 32.9 (L) 39.0 - 52.0 %   MCV 88.2 78.0 - 100.0 fL   MCH 30.0 26.0 - 34.0 pg   MCHC 34.0 30.0 - 36.0  g/dL   RDW 14.5 11.5 - 15.5 %   Platelets 223 150 - 400 K/uL   Neutrophils Relative % 68 %   Neutro Abs 7.0 1.7 - 7.7 K/uL   Lymphocytes Relative 19 %   Lymphs Abs 2.0 0.7 - 4.0 K/uL   Monocytes Relative 9 %   Monocytes Absolute 1.0 0.1 - 1.0  K/uL   Eosinophils Relative 4 %   Eosinophils Absolute 0.4 0.0 - 0.7 K/uL   Basophils Relative 0 %   Basophils Absolute 0.0 0.0 - 0.1 K/uL    Comment: Performed at [facility], [place] [address], [place], [place] [address]  Basic metabolic panel     Status: Abnormal   Collection Time: 06/20/16  6:19 PM  Result Value Ref Range   Sodium 137 135 - 145 mmol/L   Potassium 3.9 3.5 - 5.1 mmol/L   Chloride 99 (L) 101 - 111 mmol/L   CO2 28 22 - 32 mmol/L   Glucose, Bld 94 65 - 99 mg/dL   BUN 24 (H) 6 - 20 mg/dL   Creatinine, Ser 0.98 0.61 - 1.24 mg/dL   Calcium 9.3 8.9 - 10.3 mg/dL   GFR calc non Af Amer >60 >60 mL/min   GFR calc Af Amer >60 >60 mL/min    Comment: (NOTE) The eGFR has been calculated using the CKD EPI equation. This calculation has not been validated in all clinical situations. eGFR's persistently <60 mL/min signify possible Chronic Kidney Disease.    Anion gap 10 5 - 15    Comment: Performed at [facility], [place] [address] [place], [place] [address]    Blood Alcohol level:  Lab Results  Component Value Date   [facility] <5 06/11/2016   ETH <11 83/66/2947    Metabolic Disorder Labs: No results found for: HGBA1C, MPG Lab Results  Component Value Date   PROLACTIN 35.6 (H) 06/18/2016   Lab Results  Component Value Date   CHOL 216 (H) 06/18/2016   TRIG 45 06/18/2016   HDL 76 06/18/2016   CHOLHDL 2.8 06/18/2016   VLDL 9 06/18/2016   LDLCALC 131 (H) 06/18/2016   LDLCALC 99 07/15/2008    Physical Findings: AIMS: Facial and Oral Movements Muscles of Facial Expression: None, normal Lips and Perioral Area: None, normal Jaw: None, normal Tongue: None, normal,Extremity Movements Upper  (arms, wrists, hands, fingers): None, normal Lower (legs, knees, ankles, toes): None, normal, Trunk Movements Neck, shoulders, hips: None, normal, Overall Severity Severity of abnormal movements (highest score from questions above): None, normal Incapacitation due to abnormal movements: None, normal Patient's awareness of abnormal movements (rate only patient's report): No Awareness, Dental Status Current problems with teeth and/or dentures?: No Does patient usually wear dentures?: No  CIWA:  CIWA-Ar Total: 1 COWS:  COWS Total Score: 2  Musculoskeletal: Strength & Muscle Tone: within normal limits Gait & Station: normal Patient leans: N/A  Psychiatric Specialty Exam: Physical Exam  Constitutional: He is oriented to person, place, and time. He appears well-developed and well-nourished.  HENT:  Head: Normocephalic and atraumatic.  Eyes: Conjunctivae and EOM are normal. Pupils are equal, round, and reactive to light.  Neck: Normal range of motion.  Cardiovascular: Normal rate, regular rhythm and normal heart sounds.   Respiratory: Effort normal and breath sounds normal.  GI: Soft. Bowel sounds are normal.  Musculoskeletal: Normal range of motion.  Neurological: He is alert and oriented to person, place, and time. He has normal reflexes.  Skin: Skin is warm and dry.  Psychiatric:  As above.     ROS no fever, no chills, no dyspnea   Blood pressure (!) 131/92, pulse (!) 103, temperature 98.6 F (37 C), temperature source Oral, resp. rate 16, height 5' 5"  (1.651 m), weight 47.6 kg (105 lb), SpO2 99 %.Body mass index is 17.47 kg/m.  General Appearance: improved grooming  Eye  Contact: good   Speech: normal   Volume:   Normal   Mood:  Improving mood   Affect:   More reactive   Thought Process:  Goal Directed and Descriptions of Associations: Intact  Orientation:  Full (Time, Place, and Person)  Thought Content: reports occasional " whispers ", but does not appear internally  preoccupied, no delusions expressed   Suicidal Thoughts:  No denies any suicidal or self injurious ideations, no homicidal or violent ideations  Homicidal Thoughts:  No  Memory:   Recent and remote grossly intact   Judgement:  Improving   Insight:   Improving   Psychomotor Activity: normal   Concentration:  Good   Recall:  Good   Fund of Knowledge:  Good   Language:  Good  Akathisia:  Negative  Handed:  Right   AIMS (if indicated):     Assets:  Communication Skills Desire for Improvement  ADL's:  Intact  Cognition:  WNL  Sleep:  Number of Hours: 6   Assessment - patient continues to present with improvement , compared to admission- presents Brian depressed, with improved mood and range of affect. Symptoms of pneumonia have resolved and at this time no cough or dyspnea at room air .  Reports vague whispers at times, but does not appear internally preoccupied . No delusions .  Tolerating medications well . EKG was  done to rule out QTc prolongation .  Treatment Plan Summary: Treatment plan reviewed as below today 4/19   Encourage ongoing group and milieu participation to work on coping skills and symptom reduction Continue to encourage efforts to work on sobriety and relapse prevention  Treatment team working on disposition planning  Continue Prozac 20 mgrs QDAY for depression Continue Neurontin 200 mgrs BID for mood disorder , anxiety  Increase  Risperidone to  0.5 mgrs QAM and 1 mgr QHS  for mood disorder, psychotic symptoms Continue Trazodone 100  mgrs QHS PRN for insomnia    Jenne Campus, MD 06/22/2016, 5:49 PM   Patient ID: Lorrine Kin, male   DOB: January 30, 1971, 46 y.o.   MRN: [id number]

## 2016-06-23 ENCOUNTER — Encounter (HOSPITAL_COMMUNITY): Payer: Self-pay

## 2016-06-23 MED ORDER — CEFPODOXIME PROXETIL 200 MG PO TABS
200.0000 mg | ORAL_TABLET | Freq: Two times a day (BID) | ORAL | Status: DC
  Administered 2016-06-23 – 2016-06-26 (×6): 200 mg via ORAL
  Filled 2016-06-23 (×9): qty 1

## 2016-06-23 MED ORDER — DIPHENHYDRAMINE HCL 25 MG PO CAPS
25.0000 mg | ORAL_CAPSULE | Freq: Once | ORAL | Status: AC
  Administered 2016-06-23: 25 mg via ORAL

## 2016-06-23 MED ORDER — FAMOTIDINE 20 MG PO TABS
20.0000 mg | ORAL_TABLET | Freq: Two times a day (BID) | ORAL | Status: DC
  Administered 2016-06-23 – 2016-06-26 (×6): 20 mg via ORAL
  Filled 2016-06-23 (×11): qty 1

## 2016-06-23 MED ORDER — RANITIDINE HCL 150 MG PO CAPS: 150.0000 mg | ORAL_CAPSULE | Freq: Two times a day (BID) | ORAL | 0 refills | Status: DC

## 2016-06-23 MED ORDER — DEXAMETHASONE SODIUM PHOSPHATE 10 MG/ML IJ SOLN
10.0000 mg | Freq: Once | INTRAMUSCULAR | Status: AC
  Administered 2016-06-23: 10 mg via INTRAMUSCULAR
  Filled 2016-06-23: qty 1

## 2016-06-23 MED ORDER — DIPHENHYDRAMINE HCL 50 MG/ML IJ SOLN
25.0000 mg | Freq: Once | INTRAMUSCULAR | Status: DC
  Filled 2016-06-23: qty 1

## 2016-06-23 MED ORDER — DIPHENHYDRAMINE HCL 25 MG PO TABS: 25.0000 mg | ORAL_TABLET | Freq: Four times a day (QID) | ORAL | 0 refills | Status: DC | PRN

## 2016-06-23 MED ORDER — DIPHENHYDRAMINE HCL 25 MG PO CAPS
25.0000 mg | ORAL_CAPSULE | Freq: Four times a day (QID) | ORAL | Status: DC | PRN
  Administered 2016-06-23: 25 mg via ORAL
  Filled 2016-06-23 (×2): qty 1
  Filled 2016-06-23: qty 20

## 2016-06-23 MED ORDER — AMLODIPINE BESYLATE 10 MG PO TABS
10.0000 mg | ORAL_TABLET | Freq: Every day | ORAL | Status: DC
  Filled 2016-06-23: qty 14

## 2016-06-23 MED ORDER — RISPERIDONE 0.5 MG PO TABS
0.5000 mg | ORAL_TABLET | ORAL | Status: DC
  Filled 2016-06-23: qty 42

## 2016-06-23 MED ORDER — DIPHENHYDRAMINE HCL 50 MG/ML IJ SOLN
25.0000 mg | Freq: Once | INTRAMUSCULAR | Status: AC
  Administered 2016-06-23: 25 mg via INTRAMUSCULAR

## 2016-06-23 NOTE — Progress Notes (Addendum)
Angioedema returned and worse- now observed bilaterally.  No s/s distress, respirations even and non-labored.  All PM meds held.  Attending MD notified- received order to transfer patient 911 to [facility] d/t risk for airway obstruction s/t angioedema which did not respond to benadryl/famotidine.  CN and AC aware, 911 contacted, report called to [facility], report to EMS.

## 2016-06-23 NOTE — ED Notes (Signed)
[name] is in route to take pt back to [facility]

## 2016-06-23 NOTE — Progress Notes (Signed)
Patient ID: Brian Lucero, male   DOB: 03-19-1970, 46 y.o.   MRN: [id number]  Pt currently presents with a flat affect andworried behavior. Pt returns from ED tonight with remaining angioedema due to an anaphylactic reaction. Pt states "I am doing okay." Pt denies any SOB and respirations even and unlabored. Pt reports good sleep with current medication regimen.   Pt provided with medications per providers orders. Pt's labs and vitals were monitored throughout the night. Pt given a 1:1 about emotional and mental status. Pt supported and encouraged to express concerns and questions. Pt educated on medications.  Pt's safety ensured with 15 minute and environmental checks. Pt currently denies SI/HI and A/V hallucinations. Pt verbally agrees to seek staff if SI/HI or A/VH occurs and to consult with staff before acting on any harmful thoughts. Will continue POC.

## 2016-06-23 NOTE — [facility] Group Notes (Signed)
[facility] LCSW Group Therapy 06/23/2016 1:15pm  Type of Therapy: Group Therapy- Feelings Around Relapse and Recovery  Participation Level: Active   Participation Quality:  Appropriate  Affect:  Appropriate  Cognitive: Alert and Oriented   Insight:  Developing   Engagement in Therapy: Developing/Improving and Engaged   Modes of Intervention: Clarification, Confrontation, Discussion, Education, Exploration, Limit-setting, Orientation, Problem-solving, Rapport Building, Dance movement psychotherapist, Socialization and Support  Summary of Progress/Problems: The topic for today was feelings about relapse. The group discussed what relapse prevention is to them and identified triggers that they are on the path to relapse. Members also processed their feeling towards relapse and were able to relate to common experiences. Group also discussed coping skills that can be used for relapse prevention.  Pt demonstrates developing insight related to his recovery AEB his realization that his mother has been drawing boundaries for both of their benefit rather than abandoning him. He expressed being motivated for treatment and feeling that this is the right time for treatment.    Therapeutic Modalities:   Cognitive Behavioral Therapy Solution-Focused Therapy Assertiveness Training Relapse Prevention Therapy    Damien Fusi [phone] 06/23/2016 3:33 PM

## 2016-06-23 NOTE — ED Notes (Addendum)
Pt here from [facility]. Pt states he has swelling in his face that began around lunch time. Pt denies any difficulty breathing or itching anywhere. Pt denies any oral swelling and pt currently has a patent airway. Pt has clear lung sounds. No acute distress noted. Will continue to monitor

## 2016-06-23 NOTE — Progress Notes (Signed)
Patient ID: Brian Lucero, male   DOB: [birth year], 46 y.o.   MRN: [id number]  Pt returns to [facility]. Angioedema noted on sides of jaw bilaterally. Pt denies any SOB, respirations even and unlabored. 02 remains at pt baseline of 96% on room air. Pt affect flat, speech is low, behavior is calm. Pt given snack, currently eating in the dayroom.

## 2016-06-23 NOTE — Discharge Instructions (Signed)
STOP TAKING LISINOPRIL. YOU SHOULD NEVER TAKE AN ACE-INHIBITOR AGAIN.  STOP LEVAQUIN. YOU CAN SWITCH THIS TO VANTIN 200 MG BID X 7 DAYS.

## 2016-06-23 NOTE — [facility] Group Notes (Signed)
[facility] LCSW Aftercare Discharge Planning Group Note   06/23/2016  8:45 AM  Participation Quality: Pt did not attend.  Bergen Magner B Rosalena Mccorry, MSW, LCSWA 06/23/2016 9:28 AM   

## 2016-06-23 NOTE — Progress Notes (Addendum)
[facility] MD Progress Note  06/23/2016 11:08 AM Demitris Pokorny  MRN:  [id number] Subjective: patient reports improvement compared to admission. He is motivated in going to a rehab after discharge, and states that he is fearful and reluctant to discharge back to the community as he knows this would likely result in relapse . His pulmonary symptoms have abated and other than occasional dry cough he denies any symptoms. Cough is occasional , non productive, and there is no dyspnea. Of note, patient does report some facial edema on R side of mouth, not associated with any other symptoms. Denies any mucosal involvement, no rash, no wheezing, no dyspnea, he denies any dental pain or mouth ulceration or pain. He is unsure of what may have caused this- no new medications today or yesterday .     Objective : I have discussed case with treatment team and have met with patient. Patient presents with improving mood and range of affect, future oriented, and looking forward to going to rehab- as discussed with team, patient has a screening at [facility] rehab early on Monday . Denies any suicidal ideations. No disruptive of agitated behaviors on unit, going to some groups. Visible in day room/milieu. Denies medication side effects. As noted presents with R facial edema, without any associated symptoms, and no associated dental pain or mouth pain or visible infection.   Principal Problem: Substance Induced Psychotic Disorder Diagnosis:   Patient Active Problem List   Diagnosis Date Noted  . Substance-induced psychotic disorder with hallucinations ([place]) [F19.951] 06/14/2016  . Major depressive disorder, recurrent episode, severe, with psychosis ([place]) [F33.3] 06/12/2016  . Cocaine use disorder, severe, dependence ([place]) [F14.20] 06/12/2016  . Major depressive disorder, recurrent severe without psychotic features ([place]) [F33.2] 06/12/2016  . Seizure ([place]) [R56.9] 11/17/2011  . Tobacco abuse [Z72.0] 11/17/2011  . Alcohol  abuse [F10.10] 11/17/2011  . Cocaine abuse [F14.10] 11/17/2011  . Bipolar 2 disorder ([place]) [F31.81] 11/17/2011  . Hypertension [I10] 11/17/2011  . Hydrocephalus [G91.9] 11/17/2011  . Aspiration pneumonia ([place]) [J69.0] 11/17/2011   Total Time spent with patient: 20 minutes  Past Psychiatric History: As in H&P  Past Medical History:  Past Medical History:  Diagnosis Date  . Anxiety   . Bipolar 2 disorder ([place])   . Depression   . Hypertension   . Pneumothorax    as a baby in the 74's  . Seizures ([place])    History reviewed. No pertinent surgical history. Family History:  Family History  Problem Relation Age of Onset  . Diabetes type II Mother   . Diabetes type II     Family Psychiatric  History: As in H&P Social History:  History  Alcohol Use  . 1.2 oz/week  . 2 Cans of beer per week    Comment: 1 40oz Fri. and Sat.     History  Drug Use  . Types: Cocaine    Comment: Used yesterday    Social History   Social History  . Marital status: Single    Spouse name: N/A  . Number of children: N/A  . Years of education: N/A   Social History Main Topics  . Smoking status: Current Every Day Smoker    Packs/day: 0.20    Types: Cigarettes  . Smokeless tobacco: Never Used  . Alcohol use 1.2 oz/week    2 Cans of beer per week     Comment: 1 40oz Fri. and Sat.  . Drug use: Yes    Types: Cocaine  Comment: Used yesterday  . Sexual activity: Yes   Other Topics Concern  . None   Social History Narrative  . None   Additional Social History:   Single, lives alone. Never married. No kids. Not in any relationship. No support from his family. On disability. No legal issues. No other stressors.   Sleep:improved   Appetite:  Good  Current Medications: Current Facility-Administered Medications  Medication Dose Route Frequency Provider Last Rate Last Dose  . acetaminophen (TYLENOL) tablet 650 mg  650 mg Oral Q4H PRN Patrecia Pour, NP   650 mg at 06/21/16 2200  .  albuterol (PROVENTIL HFA;VENTOLIN HFA) 108 (90 Base) MCG/ACT inhaler 2 puff  2 puff Inhalation Q4H Patrecia Pour, NP   2 puff at 06/23/16 0617  . alum & mag hydroxide-simeth (MAALOX/MYLANTA) 200-200-20 MG/5ML suspension 30 mL  30 mL Oral Q6H PRN Laverle Hobby, PA-C   30 mL at 06/16/16 1859  . amLODipine (NORVASC) tablet 10 mg  10 mg Oral Daily Mariel Aloe, MD   10 mg at 06/23/16 0744  . feeding supplement (ENSURE ENLIVE) (ENSURE ENLIVE) liquid 237 mL  237 mL Oral BID BM Encarnacion Slates, NP   237 mL at 06/22/16 1428  . FLUoxetine (PROZAC) capsule 20 mg  20 mg Oral Daily Artist Beach, MD   20 mg at 06/23/16 0744  . gabapentin (NEURONTIN) capsule 200 mg  200 mg Oral BID Patrecia Pour, NP   200 mg at 06/23/16 0746  . guaiFENesin (MUCINEX) 12 hr tablet 1,200 mg  1,200 mg Oral BID PRN Mariel Aloe, MD      . hydrochlorothiazide (HYDRODIURIL) tablet 25 mg  25 mg Oral Daily Patrecia Pour, NP   25 mg at 06/23/16 0746  . hydrOXYzine (ATARAX/VISTARIL) tablet 50 mg  50 mg Oral Q6H PRN Laverle Hobby, PA-C      . levofloxacin (LEVAQUIN) tablet 750 mg  750 mg Oral Daily Belkys A Regalado, MD   750 mg at 06/23/16 0745  . lisinopril (PRINIVIL,ZESTRIL) tablet 20 mg  20 mg Oral Daily Patrecia Pour, NP   20 mg at 06/23/16 0745  . magnesium hydroxide (MILK OF MAGNESIA) suspension 30 mL  30 mL Oral Daily PRN Patrecia Pour, NP      . nicotine (NICODERM CQ - dosed in mg/24 hours) patch 21 mg  21 mg Transdermal Daily Encarnacion Slates, NP   21 mg at 06/23/16 0744  . ondansetron (ZOFRAN) tablet 4 mg  4 mg Oral Q8H PRN Patrecia Pour, NP      . risperiDONE (RISPERDAL) tablet 0.5 mg  0.5 mg Oral BH-q7a Myer Peer Jorrell Kuster, MD   0.5 mg at 06/23/16 0616  . risperiDONE (RISPERDAL) tablet 1 mg  1 mg Oral QHS Jenne Campus, MD   1 mg at 06/22/16 2150  . traZODone (DESYREL) tablet 100 mg  100 mg Oral QHS PRN Jenne Campus, MD   100 mg at 06/22/16 2150    Lab Results:  No results found for this or any previous  visit (from the past 48 hour(s)).  Blood Alcohol level:  Lab Results  Component Value Date   ETH <5 06/11/2016   ETH <11 58/52/7782    Metabolic Disorder Labs: No results found for: HGBA1C, MPG Lab Results  Component Value Date   PROLACTIN 35.6 (H) 06/18/2016   Lab Results  Component Value Date   CHOL 216 (H) 06/18/2016   TRIG 45  06/18/2016   HDL 76 06/18/2016   CHOLHDL 2.8 06/18/2016   VLDL 9 06/18/2016   LDLCALC 131 (H) 06/18/2016   LDLCALC 99 07/15/2008    Physical Findings: AIMS: Facial and Oral Movements Muscles of Facial Expression: None, normal Lips and Perioral Area: None, normal Jaw: None, normal Tongue: None, normal,Extremity Movements Upper (arms, wrists, hands, fingers): None, normal Lower (legs, knees, ankles, toes): None, normal, Trunk Movements Neck, shoulders, hips: None, normal, Overall Severity Severity of abnormal movements (highest score from questions above): None, normal Incapacitation due to abnormal movements: None, normal Patient's awareness of abnormal movements (rate only patient's report): No Awareness, Dental Status Current problems with teeth and/or dentures?: No Does patient usually wear dentures?: No  CIWA:  CIWA-Ar Total: 1 COWS:  COWS Total Score: 2  Musculoskeletal: Strength & Muscle Tone: within normal limits Gait & Station: normal Patient leans: N/A  Psychiatric Specialty Exam: Physical Exam  Constitutional: He is oriented to person, place, and time. He appears well-developed and well-nourished.  HENT:  Head: Normocephalic and atraumatic.  Eyes: Conjunctivae and EOM are normal. Pupils are equal, round, and reactive to light.  Neck: Normal range of motion.  Cardiovascular: Normal rate, regular rhythm and normal heart sounds.   Respiratory: Effort normal and breath sounds normal.  GI: Soft. Bowel sounds are normal.  Musculoskeletal: Normal range of motion.  Neurological: He is alert and oriented to person, place, and time.  He has normal reflexes.  Skin: Skin is warm and dry.  Psychiatric:  As above.     ROS no fever, no chills, no shortness of breath or wheezing, no dental pain, no mucosal involvement   Blood pressure (!) 131/93, pulse 98, temperature 97.9 F (36.6 C), temperature source Oral, resp. rate 18, height _0  (1.651 m), weight 47.6 kg (105 lb), SpO2 99 %.Body mass index is 17.47 kg/m.  General Appearance: well groomed   Eye Contact:  Good   Speech: normal    Volume:   Normal   Mood:  Improving , today stable, euthymic  Affect:   Brighter, reactive    Thought Process:  Goal Directed and Descriptions of Associations: Intact  Orientation:  Full (Time, Place, and Person)  Thought Content: no hallucinations reported today, no delusions, not internally preoccupied   Suicidal Thoughts:  No denies any suicidal or self injurious ideations, no homicidal or violent ideations  Homicidal Thoughts:  No  Memory:  Recent and remote grossly intact   Judgement: improving    Insight:   Improving   Psychomotor Activity: normal   Concentration:  Good   Recall:  Good   Fund of Knowledge:  Good   Language:  Good  Akathisia:  No  Handed:  Right   AIMS (if indicated):     Assets:  Desire for Improvement Resilience  ADL's:  Intact  Cognition:  WNL  Sleep:  Number of Hours: 6.5   Assessment - patient is improved compared to admission . Pneumonia/ pulmonary symptoms much improved with Levaquin antibiotic course . Currently no shortness of breath, minimal cough, no fever. Of note, has developed likely angioedema today ( states woke up with it). Cause unclear, no new medications temporally associated . No systemic or respiratory symptoms, no rash.  Of note, patient had reported vague auditory hallucinations, currently resolved after Risperidone titrated  Up .  Treatment Plan Summary: Treatment plan reviewed as below today 4/20  Encourage ongoing group and milieu participation to work on coping skills and  symptom reduction Continue to  encourage efforts to work on sobriety and relapse prevention  Treatment team working on disposition planning  Continue Prozac 20 mgrs QDAY for depression Continue Neurontin 200 mgrs BID for mood disorder , anxiety  Continue Risperidone  0.5 mgrs QAM and 1 mgr QHS  for mood disorder, psychotic symptoms Continue Trazodone 100  mgrs QHS PRN for insomnia  I have discussed case with hospitalist- recommendation is Benadryl 25 mgrs Q 6 hours PRN , start Pepcid 20 mgrs BID, and monitor.  Will repeat EKG in AM to monitor QTc ( while on Levaquin )  At this time plan is for early discharge on Monday AM to go to [facility] .   Jenne Campus, MD 06/23/2016, 11:08 AM   Patient ID: Lorrine Kin, male   DOB: 01/11/1971, 46 y.o.   MRN: [id number]   4/20 /18 at 4, 00 PM  Discussed facial swelling, angioedema with hospitalist , Dr. Wynelle Cleveland- it is now much improved following benadryl/pepcid. It is felt to possibly be related to Lisinopril management, but could also be secondary to Levaquin, which was started recently for pneumonia treatment. Recommendation was to stop Lisinopril, stop Levaquin, and switch to [facility] for ongoing treatment .  Gabriel Earing ,MD

## 2016-06-23 NOTE — Progress Notes (Signed)
Nursing Note 06/23/2016 [id number]  Data Reports sleeping good with PRN sleep med.  Rates depression 3/10, hopelessness 3/10, and anxiety 4/10. Affect wide ranged and appropriate mood euthymic.  Denies HI, AVH. Endorses passive SI with no intent, agrees to come to staff before acting on any harmful thoughts.  Angioedema noted this AM (mild)- MD aware no new orders in AM.  Worstened in afternoon- patient started on benadryl/pepcid.  Received 2 doses of benadryl  this afternoon which successfully reduced angioedema.  Patient had no SOB or tongue swelling.  Reports cough from pneumonia has greatly improved, feeling better today.  Afebrile.  Action Spoke with patient 1:1, nurse offered support to patient throughout shift.  Educated to notify staff if he has difficulty breathing or tongue swelling.  MD notified of lisinopril- MD consulted IM and lisinopril and patient's ABT (levaquin) both discontinued.  Patient started on a new antibiotic for PNA.  Continues to be monitored on 15 minute checks for safety.  Response Remains safe on unit- angioedema greatly reduced.  PRN benadryl ordered in case it returns.

## 2016-06-23 NOTE — Progress Notes (Signed)
  [facility] Adult Case Management Discharge Plan :  Will you be returning to the same living situation after discharge:  No.Pt has screening for possible admission at [facility] Monday AM.  At discharge, do you have transportation home?: Yes,  Taxi voucher in chart for Monday. Taxi must be called no later than 7am on Monday 4/23 Do you have the ability to pay for your medications: Yes,  Medicaid and Medicare  Release of information consent forms completed and submitted to medical records by CSW.  Patient to Follow up at: Follow-up Information    [facility] Follow up on 06/26/2016.   Why:  Screening for possible admission on Monday at 7:45AM. Please bring photo ID/proof of [place] residence and [place] card. Please bring 2 weeks of medications and 30 day prescription supplied to you by the hospital. Thank you.  Contact information: [address] [place] [address] [phone]        [facility] Follow up.   Specialty:  Behavioral Health Why:  Walk in within 7 days of hospital or treatment facility discharge to be assessed for mental health services including Medication management and counseling. Walk in hours: Monday through Friday 8am-9am. Thank you.  Contact information: [address] [place] [address] [phone]           Next level of care provider has access to [facility] Link:no  Safety Planning and Suicide Prevention discussed: Yes,  SPE completed with pt; pt declined to consent to family contact.  Have you used any form of tobacco in the last 30 days? (Cigarettes, Smokeless Tobacco, Cigars, and/or Pipes): Yes  Has patient been referred to the Quitline?: Patient refused referral  Patient has been referred for addiction treatment: Yes  Arella Blinder N Smart  LCSW 06/23/2016, 3:58 PM

## 2016-06-23 NOTE — ED Triage Notes (Signed)
Pt does have swelling noted to bilateral lower face and lower lip.  States started at lunch time.

## 2016-06-23 NOTE — Tx Team (Signed)
Interdisciplinary Treatment and Diagnostic Plan Update  06/23/2016 Time of Session: 0930 [facility] MRN: [id number]  Principal Diagnosis: MDD  Secondary Diagnoses: Principal Problem:   Substance-induced psychotic disorder with hallucinations ([place]) Active Problems:   Major depressive disorder, recurrent severe without psychotic features ([place])   Current Medications:  Current Facility-Administered Medications  Medication Dose Route Frequency Provider Last Rate Last Dose  . acetaminophen (TYLENOL) tablet 650 mg  650 mg Oral Q4H PRN Patrecia Pour, NP   650 mg at 06/21/16 2200  . albuterol (PROVENTIL HFA;VENTOLIN HFA) 108 (90 Base) MCG/ACT inhaler 2 puff  2 puff Inhalation Q4H Patrecia Pour, NP   2 puff at 06/23/16 1212  . alum & mag hydroxide-simeth (MAALOX/MYLANTA) 200-200-20 MG/5ML suspension 30 mL  30 mL Oral Q6H PRN Laverle Hobby, PA-C   30 mL at 06/16/16 1859  . amLODipine (NORVASC) tablet 10 mg  10 mg Oral Daily Mariel Aloe, MD   10 mg at 06/23/16 0744  . cefpodoxime (VANTIN) tablet 200 mg  200 mg Oral Q12H Fernando A Cobos, MD      . diphenhydrAMINE (BENADRYL) capsule 25 mg  25 mg Oral Q6H PRN Jenne Campus, MD   25 mg at 06/23/16 1315  . famotidine (PEPCID) tablet 20 mg  20 mg Oral BID Jenne Campus, MD   20 mg at 06/23/16 1316  . feeding supplement (ENSURE ENLIVE) (ENSURE ENLIVE) liquid 237 mL  237 mL Oral BID BM Encarnacion Slates, NP   237 mL at 06/22/16 1428  . FLUoxetine (PROZAC) capsule 20 mg  20 mg Oral Daily Artist Beach, MD   20 mg at 06/23/16 0744  . gabapentin (NEURONTIN) capsule 200 mg  200 mg Oral BID Patrecia Pour, NP   200 mg at 06/23/16 0746  . guaiFENesin (MUCINEX) 12 hr tablet 1,200 mg  1,200 mg Oral BID PRN Mariel Aloe, MD      . hydrochlorothiazide (HYDRODIURIL) tablet 25 mg  25 mg Oral Daily Patrecia Pour, NP   25 mg at 06/23/16 0746  . magnesium hydroxide (MILK OF MAGNESIA) suspension 30 mL  30 mL Oral Daily PRN Patrecia Pour, NP      .  nicotine (NICODERM CQ - dosed in mg/24 hours) patch 21 mg  21 mg Transdermal Daily Encarnacion Slates, NP   21 mg at 06/23/16 0744  . ondansetron (ZOFRAN) tablet 4 mg  4 mg Oral Q8H PRN Patrecia Pour, NP      . risperiDONE (RISPERDAL) tablet 0.5 mg  0.5 mg Oral BH-q7a Myer Peer Cobos, MD   0.5 mg at 06/23/16 0616  . risperiDONE (RISPERDAL) tablet 1 mg  1 mg Oral QHS Jenne Campus, MD   1 mg at 06/22/16 2150  . traZODone (DESYREL) tablet 100 mg  100 mg Oral QHS PRN Jenne Campus, MD   100 mg at 06/22/16 2150   PTA Medications: Prescriptions Prior to Admission  Medication Sig Dispense Refill Last Dose  . divalproex (DEPAKOTE) 500 MG DR tablet Take 500 mg by mouth 2 (two) times daily.   Past Week at Unknown time  . hydrochlorothiazide (HYDRODIURIL) 25 MG tablet Take 1 tablet (25 mg total) by mouth daily. 30 tablet 0 Past Week at Unknown time  . [EXPIRED] levofloxacin (LEVAQUIN) 750 MG tablet Take 1 tablet (750 mg total) by mouth daily. X 7 days 6 tablet 0   . lisinopril (PRINIVIL,ZESTRIL) 20 MG tablet Take 1 tablet (20 mg  total) by mouth daily. 30 tablet 0 Past Week at Unknown time  . sertraline (ZOLOFT) 100 MG tablet Take 100 mg by mouth 3 (three) times daily.   Past Week at Unknown time    Patient Stressors: Substance abuse  Patient Strengths: Curator fund of knowledge  Treatment Modalities: Medication Management, Group therapy, Case management,  1 to 1 session with clinician, Psychoeducation, Recreational therapy.   Physician Treatment Plan for Primary Diagnosis: MDD  Medication Management: Evaluate patient's response, side effects, and tolerance of medication regimen.  Therapeutic Interventions: 1 to 1 sessions, Unit Group sessions and Medication administration.  Evaluation of Outcomes: Met  Physician Treatment Plan for Secondary Diagnosis: Principal Problem:   Substance-induced psychotic disorder with hallucinations ([place]) Active Problems:   Major  depressive disorder, recurrent severe without psychotic features ([place])  Long Term Goal(s): Improvement in symptoms so as ready for discharge Improvement in symptoms so as ready for discharge   Short Term Goals: Ability to identify changes in lifestyle to reduce recurrence of condition will improve Ability to verbalize feelings will improve Ability to disclose and discuss suicidal ideas Ability to demonstrate self-control will improve Ability to identify and develop effective coping behaviors will improve Compliance with prescribed medications will improve Ability to identify triggers associated with substance abuse/mental health issues will improve     Medication Management: Evaluate patient's response, side effects, and tolerance of medication regimen.  Therapeutic Interventions: 1 to 1 sessions, Unit Group sessions and Medication administration.  Evaluation of Outcomes: Met  RN Treatment Plan for Primary Diagnosis: MDD Long Term Goal(s): Knowledge of disease and therapeutic regimen to maintain health will improve  Short Term Goals: Ability to remain free from injury will improve, Ability to verbalize feelings will improve and Ability to disclose and discuss suicidal ideas  Medication Management: RN will administer medications as ordered by provider, will assess and evaluate patient's response and provide education to patient for prescribed medication. RN will report any adverse and/or side effects to prescribing provider.  Therapeutic Interventions: 1 on 1 counseling sessions, Psychoeducation, Medication administration, Evaluate responses to treatment, Monitor vital signs and CBGs as ordered, Perform/monitor CIWA, COWS, AIMS and Fall Risk screenings as ordered, Perform wound care treatments as ordered.  Evaluation of Outcomes: Met  LCSW Treatment Plan for Primary Diagnosis: MDD Long Term Goal(s): Safe transition to appropriate next level of care at discharge, Engage patient in  therapeutic group addressing interpersonal concerns.  Short Term Goals: Engage patient in aftercare planning with referrals and resources, Facilitate patient progression through stages of change regarding substance use diagnoses and concerns and Identify triggers associated with mental health/substance abuse issues  Therapeutic Interventions: Assess for all discharge needs, 1 to 1 time with Social worker, Explore available resources and support systems, Assess for adequacy in community support network, Educate family and significant other(s) on suicide prevention, Complete Psychosocial Assessment, Interpersonal group therapy.  Evaluation of Outcomes: Met   Progress in Treatment: Attending groups: Yes. Participating in groups: Yes. Taking medication as prescribed: Yes. Toleration medication: Yes. Family/Significant other contact made: SPE completed with pt; pt declines to consent to family contact.  Patient understands diagnosis: Yes. Discussing patient identified problems/goals with staff: Yes. Medical problems stabilized or resolved: Yes. Denies suicidal/homicidal ideation: yes, per self report.  Issues/concerns per patient self-inventory: No. Other: n/a  New problem(s) identified: No, Describe:  n/a  New Short Term/Long Term Goal(s): detox; elimination of AH/SI, medication stabilization; development of comprehensive mental wellness/sobriety plan.   Discharge Plan or Barriers: Pt  has a screening for possible admission rescheduled for Monday at 745AM. Screening rescheduled due to pneumonia diagnosis on 4/17; MD chose to cancel discharge due to pneumonia/chest xray.   Reason for Continuation of Hospitalization: medication management  Estimated Length of Stay: 2 days (discharge scheduled for Monday at 7am via taxi--voucher in chart).   Attendees: Patient: 06/23/2016 3:59 PM  Physician: Dr. Parke Poisson MD 06/23/2016 3:59 PM  Nursing: Melina Copa RN 06/23/2016 3:59 PM  RN Care Manager:  Lars Pinks CM 06/23/2016 3:59 PM  Social Worker: Maxie Better, LCSW 06/23/2016 3:59 PM  Recreational Therapist: Rhunette Croft 06/23/2016 3:59 PM  Other: Lindell Spar NP 06/23/2016 3:59 PM  Other:  06/23/2016 3:59 PM  Other: 06/23/2016 3:59 PM    Scribe for Treatment Team: [place], LCSW 06/23/2016 3:59 PM

## 2016-06-23 NOTE — Progress Notes (Signed)
Recreation Therapy Notes  Date: 06/23/16 Time: 0930 Location: [address] Dayroom  Group Topic: Stress Management  Goal Area(s) Addresses:  Patient will verbalize importance of using healthy stress management.  Patient will identify positive emotions associated with healthy stress management.   Intervention: Stress Management  Activity :  Forest Visualization.  LRT introduced the stress management technique of guided imagery.  LRT read a script to allow patients to engage in the technique.  Patients were to follow along as LRT read the script to participate in the stress management technique.  Education:  Stress Management, Discharge Planning.   Education Outcome: Acknowledges edcuation/In group clarification offered/Needs additional education  Clinical Observations/Feedback: Pt did not attend group.   Oral [place], LRT/CTRS         Faiga Stones A 06/23/2016 12:24 PM 

## 2016-06-23 NOTE — ED Triage Notes (Signed)
Per EMS, [facility] from [facility].  EMS stating told that [facility] felt pt had "angioedema".  Was medicated without relief.  No respiratory distress on arrival. Vitals:  132/76, hr 90,

## 2016-06-23 NOTE — Plan of Care (Signed)
Problem: Safety: Goal: Ability to disclose and discuss suicidal ideas will improve Outcome: Progressing Pt reports passive SI today to Clinical research associate, denies any current plan

## 2016-06-23 NOTE — Progress Notes (Signed)
Pt attended karaoke wrap up group. Pt interacted appropriately, but did not sing. Caswell Corwin, NT 06/22/2016  2000

## 2016-06-23 NOTE — [facility] Group Notes (Addendum)
[facility] Group Notes:  (Nursing/MHT/Case Management/Adjunct)  Date:  06/23/2016  Time:  3:39 PM  Type of Therapy:  Psychoeducational Skills  Participation Level:  Active  Participation Quality:  Appropriate and Attentive  Affect:  Appropriate  Cognitive:  Alert  Insight:  Appropriate  Engagement in Group:  Engaged  Modes of Intervention:  Discussion and Education  Summary of Progress/Problems:  Pt participated in goals group. Today's discussion was about relapse prevention. Pt's shared with the group what his triggers are and coping skills that he can use to prevent from relapsing.   Karren Cobble 06/23/2016, 3:39 PM

## 2016-06-23 NOTE — ED Notes (Signed)
Report given to Maralyn Sago at [facility]

## 2016-06-23 NOTE — ED Provider Notes (Signed)
[facility]-EMERGENCY DEPT Provider Note   CSN: [id number] Arrival date & time: 06/23/16  1816     History   Chief Complaint Chief Complaint  Patient presents with  . Allergic Reaction    HPI Brian Lucero is a 46 y.o. male.  HPI   46 yo M with bipolar d/o, seizures here with angioedema. Per pt and review of records, pt is currently in [facility] for his psychiatric disease. He reportedly began developing mild right sided then left sided facial swelling that did not resolve with benadryl. It progressed so he is sent here. He has had no tongue swelling. No voice changes. No difficulty swallowing or speaking. He did recently start levaquin for a pneumonia. Lisinopril has been held. No urticaria, nausea, vomiting. No other complaints. No SOB.  Past Medical History:  Diagnosis Date  . Anxiety   . Bipolar 2 disorder (HCC)   . Depression   . Hypertension   . Pneumothorax    as a baby in the 88's  . Seizures [facility])     Patient Active Problem List   Diagnosis Date Noted  . 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 Major depressive disorder, recurrent episode, severe, with psychosis (HCC) 06/12/2016  . Cocaine use disorder, severe, dependence (HCC) 06/12/2016  . Major depressive disorder, recurrent severe without psychotic features (HCC) 06/12/2016  . Seizure (HCC) 11/17/2011  . Tobacco abuse 11/17/2011  . Alcohol abuse 11/17/2011  . Cocaine abuse 11/17/2011  . Bipolar 2 disorder (HCC) 11/17/2011  . Hypertension 11/17/2011  . Hydrocephalus 11/17/2011  . Aspiration pneumonia (HCC) 11/17/2011    History reviewed. No pertinent surgical history.     Home Medications    Prior to Admission medications   Medication Sig Start Date End Date Taking? Authorizing Provider  albuterol (PROVENTIL HFA;VENTOLIN HFA) 108 (90 Base) MCG/ACT inhaler Inhale 2 puffs into the lungs every 4 (four) hours. For shortness of breath 06/20/16   Sanjuana Kava, NP  amLODipine  (NORVASC) 5 MG tablet Take 1 tablet (5 mg total) by mouth daily. For high blood pressure 06/21/16   Sanjuana Kava, NP  diphenhydrAMINE (BENADRYL) 25 MG tablet Take 1 tablet (25 mg total) by mouth every 6 (six) hours as needed for itching. 06/23/16 06/28/16  Shaune Pollack, MD  divalproex (DEPAKOTE) 500 MG DR tablet Take 500 mg by mouth 2 (two) times daily.    Historical Provider, MD  FLUoxetine (PROZAC) 20 MG capsule Take 1 capsule (20 mg total) by mouth daily. For depression 06/21/16   Sanjuana Kava, NP  gabapentin (NEURONTIN) 100 MG capsule Take 2 capsules (200 mg total) by mouth 2 (two) times daily. For agitation 06/20/16   Sanjuana Kava, NP  hydrochlorothiazide (HYDRODIURIL) 25 MG tablet Take 1 tablet (25 mg total) by mouth daily. 07/16/13   Elwin Mocha, MD  hydrochlorothiazide (HYDRODIURIL) 25 MG tablet Take 1 tablet (25 mg total) by mouth daily. For high blood pressure 06/21/16   Sanjuana Kava, NP  lisinopril (PRINIVIL,ZESTRIL) 20 MG tablet Take 1 tablet (20 mg total) by mouth daily. For high blood pressure 06/21/16   Sanjuana Kava, NP  nicotine (NICODERM CQ - DOSED IN MG/24 HOURS) 21 mg/24hr patch Place 1 patch (21 mg total) onto the skin daily. For smoking cessation 06/21/16   Sanjuana Kava, NP  ranitidine (ZANTAC) 150 MG capsule Take 1 capsule (150 mg total) by mouth 2 (two) times daily. 06/23/16 06/28/16  Shaune Pollack, MD  risperiDONE (RISPERDAL) 0.5 MG tablet Take  1 tablet (0.5 mg total) by mouth 2 (two) times daily. For mood control 06/20/16   Sanjuana Kava, NP  sertraline (ZOLOFT) 100 MG tablet Take 100 mg by mouth 3 (three) times daily.    Historical Provider, MD  traZODone (DESYREL) 50 MG tablet Take 1 tablet (50 mg) at bedtime: For sleep 06/20/16   Sanjuana Kava, NP    Family History Family History  Problem Relation Age of Onset  . Diabetes type II Mother   . Diabetes type II      Social History Social History  Substance Use Topics  . Smoking status: Current Every Day Smoker     Packs/day: 0.20    Types: Cigarettes  . Smokeless tobacco: Never Used  . Alcohol use 1.2 oz/week    2 Cans of beer per week     Comment: 1 40oz Fri. and Sat.     Allergies   Levofloxacin and Lisinopril   Review of Systems Review of Systems  Constitutional: Negative for chills, fatigue and fever.  HENT: Positive for facial swelling. Negative for congestion and rhinorrhea.   Eyes: Negative for visual disturbance.  Respiratory: Negative for cough, shortness of breath and wheezing.   Cardiovascular: Negative for chest pain and leg swelling.  Gastrointestinal: Negative for abdominal pain, diarrhea, nausea and vomiting.  Genitourinary: Negative for dysuria and flank pain.  Musculoskeletal: Negative for neck pain and neck stiffness.  Skin: Negative for rash and wound.  Allergic/Immunologic: Negative for immunocompromised state.  Neurological: Negative for syncope, weakness and headaches.  All other systems reviewed and are negative.    Physical Exam Updated Vital Signs BP (!) 135/95   Pulse 85   Temp 98.1 F (36.7 C) (Oral)   Resp 19   Ht  (1.651 m)   Wt 127 lb (57.6 kg)   SpO2 96%   BMI 21.13 kg/m   Physical Exam  Constitutional: He is oriented to person, place, and time. He appears well-developed and well-nourished. No distress.  HENT:  Head: Normocephalic and atraumatic.  Mild, soft right greater than left upper lip and maxillary swelling. No gingival abscess or lesions. No tongue swelling or protrusion. Phonation is normal. No pooling of secretions. OP widely patent.  Eyes: Conjunctivae are normal.  Neck: Neck supple.  No submandibular or neck swelling.  Cardiovascular: Normal rate, regular rhythm and normal heart sounds.  Exam reveals no friction rub.   No murmur heard. Pulmonary/Chest: Effort normal and breath sounds normal. No stridor. No respiratory distress. He has no wheezes. He has no rales.  Abdominal: Soft. He exhibits no distension.  Musculoskeletal:  He exhibits no edema.  Neurological: He is alert and oriented to person, place, and time. He exhibits normal muscle tone.  Skin: Skin is warm. Capillary refill takes less than 2 seconds.  Psychiatric: He has a normal mood and affect.  Nursing note and vitals reviewed.    ED Treatments / Results  Labs (all labs ordered are listed, but only abnormal results are displayed) Labs Reviewed  LIPID PANEL - Abnormal; Notable for the following:       Result Value   Cholesterol 216 (*)    LDL Cholesterol 131 (*)    All other components within normal limits  PROLACTIN - Abnormal; Notable for the following:    Prolactin 35.6 (*)    All other components within normal limits  CBC WITH DIFFERENTIAL/PLATELET - Abnormal; Notable for the following:    RBC 3.73 (*)    Hemoglobin 11.2 (*)  HCT 32.9 (*)    All other components within normal limits  BASIC METABOLIC PANEL - Abnormal; Notable for the following:    Chloride 99 (*)    BUN 24 (*)    All other components within normal limits  TSH    EKG  EKG Interpretation None       Radiology No results found.  Procedures Procedures (including critical care time)  Medications Ordered in ED Medications  albuterol (PROVENTIL HFA;VENTOLIN HFA) 108 (90 Base) MCG/ACT inhaler 2 puff (2 puffs Inhalation Given 06/23/16 2007)  gabapentin (NEURONTIN) capsule 200 mg (0 mg Oral Hold 06/23/16 1700)  hydrochlorothiazide (HYDRODIURIL) tablet 25 mg (25 mg Oral Given 06/23/16 0746)  acetaminophen (TYLENOL) tablet 650 mg (650 mg Oral Given 06/21/16 2200)  ondansetron (ZOFRAN) tablet 4 mg (not administered)  magnesium hydroxide (MILK OF MAGNESIA) suspension 30 mL (not administered)  nicotine (NICODERM CQ - dosed in mg/24 hours) patch 21 mg (21 mg Transdermal Patch Removed 06/23/16 0759)  feeding supplement (ENSURE ENLIVE) (ENSURE ENLIVE) liquid 237 mL (237 mLs Oral Not Given 06/23/16 1400)  dicyclomine (BENTYL) tablet 20 mg (not administered)  hydrOXYzine  (ATARAX/VISTARIL) tablet 25 mg (not administered)  loperamide (IMODIUM) capsule 2-4 mg (not administered)  methocarbamol (ROBAXIN) tablet 500 mg (500 mg Oral Given 06/14/16 2158)  naproxen (NAPROSYN) tablet 500 mg (not administered)  cloNIDine (CATAPRES) tablet 0.1 mg (0.1 mg Oral Given 06/14/16 2156)    Followed by  cloNIDine (CATAPRES) tablet 0.1 mg (0.1 mg Oral Given 06/17/16 0550)    Followed by  cloNIDine (CATAPRES) tablet 0.1 mg (0.1 mg Oral Given 06/18/16 0640)  FLUoxetine (PROZAC) capsule 20 mg (20 mg Oral Given 06/23/16 0744)  alum & mag hydroxide-simeth (MAALOX/MYLANTA) 200-200-20 MG/5ML suspension 30 mL (30 mLs Oral Given 06/16/16 1859)  amLODipine (NORVASC) tablet 10 mg (10 mg Oral Given 06/23/16 0744)  guaiFENesin (MUCINEX) 12 hr tablet 1,200 mg (not administered)  traZODone (DESYREL) tablet 100 mg (100 mg Oral Given 06/22/16 2150)  risperiDONE (RISPERDAL) tablet 0.5 mg (0.5 mg Oral Given 06/23/16 0616)  risperiDONE (RISPERDAL) tablet 1 mg (1 mg Oral Given 06/22/16 2150)  diphenhydrAMINE (BENADRYL) capsule 25 mg (25 mg Oral Given 06/23/16 1315)  famotidine (PEPCID) tablet 20 mg (0 mg Oral Hold 06/23/16 1700)  cefpodoxime (VANTIN) tablet 200 mg (200 mg Oral Given 06/23/16 2043)  levofloxacin (LEVAQUIN) tablet 750 mg (750 mg Oral Not Given 06/17/16 1058)  diphenhydrAMINE (BENADRYL) capsule 25 mg (25 mg Oral Given 06/23/16 1505)  dexamethasone (DECADRON) injection 10 mg (10 mg Intramuscular Given 06/23/16 1932)  diphenhydrAMINE (BENADRYL) injection 25 mg (25 mg Intramuscular Given 06/23/16 2006)     Initial Impression / Assessment and Plan / ED Course  I have reviewed the triage vital signs and the nursing notes.  Pertinent labs & imaging results that were available during my care of the patient were reviewed by me and considered in my medical decision making (see chart for details).     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 Pt does take ACE inhibitor which is likely offending agent,  although he is also on levaquin. No progression of swelling since arrival and pt has no tongue or posterior pharyngeal erythema, stridor, or signs of airway compromise. He was monitored without progression in the ED. 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 Will d/c levaquin and switch to Vantin, d/c Lisinopril. Benadryl and pepcid for possible allergic component. Single dose of steroids given here, unlikely to help with further tx. Would advise pt to stop all ACE, follow-up as  needed.  Final Clinical Impressions(s) / ED Diagnoses   Final diagnoses:  Allergic reaction, initial encounter  Angioedema, initial encounter      Shaune Pollack, MD 06/23/16 2126

## 2016-06-24 NOTE — Progress Notes (Signed)
Patient denies SI, HI and AVH at this time.  Patient was noted to have slight swelling in face due to anaphylactic reaction.  Patient reported no respiratory distress this shift.   Assess patient for safety, offer medications as prescribed, engage patient in 1:1 staff talks.  Continue to monitor as prescribed.

## 2016-06-24 NOTE — Progress Notes (Addendum)
Patient ID: Brian Lucero, male   DOB: 1970-04-29, 46 y.o.   MRN: [id number]  Pt requests spiritual consult today for prayer request. Pt reports anxiety over lingering angioedema from yesterday. Swelling is no longer around his jaw but over remains over his upper lip. Offered a cold pack, pt refused. No SOB reported, no audible wheezes noted, respirations even and unlabored. Will continue to monitor.

## 2016-06-24 NOTE — [facility] Group Notes (Signed)
[facility] LCSW Group Therapy Note  06/24/2016  and  10:00 AM  Type of Therapy and Topic:  Group Therapy: Avoiding Self-Sabotaging and Enabling Behaviors  Participation Level:  Active  Participation Quality:  Appropriate  Affect:  Appropriate  Cognitive:  Alert and Oriented  Insight:  Developing/Improving  Engagement in Therapy:  Engaged   Therapeutic models used: Cognitive Behavioral Therapy,  Person-Centered Therapy and Motivational Interviewing  Modes of Intervention:  Discussion, Exploration, Orientation, Rapport Building, Socialization and Support  Summary of Progress/Problems:  The main focus of today's process group was for the patient to identify ways in which they have in the past sabotaged their own recovery. Motivational Interviewing was utilized to identify motivation they may have for wanting to change. Patients were able to process how their ways of coping may have negative impacts on their well being. Patient processed how he is often nicer to others than he is with himself. He was able to process how negative thoughts and self talk negatively impact his life.   Carney Bern, LCSW

## 2016-06-24 NOTE — Plan of Care (Signed)
Problem: Safety: Goal: Periods of time without injury will increase Outcome: Progressing Pt. remains a low fall risk, denies SI/HI/AVH at this time, Q 15 checks in effect.    

## 2016-06-24 NOTE — Progress Notes (Signed)
[facility] MD Progress Note  06/24/2016 12:56 PM Brian Lucero  MRN:  [id number]   Subjective: Patient reports " I am feeling better mentally,  I am still dealing with the lung stuff."   Objective:Brian Lucero is awake, alert and oriented*3.  Seen resting day room interacting with staff and peers.  Denies suicidal or homicidal ideation during this assessment. Denies auditory or visual hallucination and does not appear to be responding to internal stimuli.  Patient reports he is medication compliant without side effects. Reports he new coping skills. Reports he needs  to release the negative in his life, to included his mother. Patient denies depression or depressive symptoms. Support, encouragement and reassurance was provided.   Principal Problem: Substance Induced Psychotic Disorder Diagnosis:   Patient Active Problem List   Diagnosis Date Noted  . 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 Major depressive disorder, recurrent episode, severe, with psychosis (HCC) [F33.3] 06/12/2016  . Cocaine use disorder, severe, dependence (HCC) [F14.20] 06/12/2016  . Major depressive disorder, recurrent severe without psychotic features (HCC) [F33.2] 06/12/2016  . Seizure (HCC) [R56.9] 11/17/2011  . Tobacco abuse [Z72.0] 11/17/2011  . Alcohol abuse [F10.10] 11/17/2011  . Cocaine abuse [F14.10] 11/17/2011  . Bipolar 2 disorder (HCC) [F31.81] 11/17/2011  . Hypertension [I10] 11/17/2011  . Hydrocephalus [G91.9] 11/17/2011  . Aspiration pneumonia (HCC) [J69.0] 11/17/2011   Total Time spent with patient: 20 minutes  Past Psychiatric History: As in H&P  Past Medical History:  Past Medical History:  Diagnosis Date  . Anxiety   . Bipolar 2 disorder (HCC)   . Depression   . Hypertension   . Pneumothorax    as a baby in the 85's  . Seizures (HCC)    History reviewed. No pertinent surgical history. Family History:  Family History  Problem Relation Age of  Onset  . Diabetes type II Mother   . Diabetes type II     Family Psychiatric  History: As in H&P Social History:  History  Alcohol Use  . 1.2 oz/week  . 2 Cans of beer per week    Comment: 1 40oz Fri. and Sat.     History  Drug Use  . Types: Cocaine    Comment: Used yesterday    Social History   Social History  . Marital status: Single    Spouse name: N/A  . Number of children: N/A  . Years of education: N/A   Social History Main Topics  . Smoking status: Current Every Day Smoker    Packs/day: 0.20    Types: Cigarettes  . Smokeless tobacco: Never Used  . Alcohol use 1.2 oz/week    2 Cans of beer per week     Comment: 1 40oz Fri. and Sat.  . Drug use: Yes    Types: Cocaine     Comment: Used yesterday  . Sexual activity: Yes   Other Topics Concern  . None   Social History Narrative  . None   Additional Social History:   Single, lives alone. Never married. No kids. Not in any relationship. No support from his family. On disability. No legal issues. No other stressors.   Sleep:improved   Appetite:  Good  Current Medications: Current Facility-Administered Medications  Medication Dose Route Frequency Provider Last Rate Last Dose  . acetaminophen (TYLENOL) tablet 650 mg  650 mg Oral Q4H PRN Charm Rings, NP   650 mg at 06/21/16 2200  . albuterol (PROVENTIL HFA;VENTOLIN HFA) 108 (90 Base) MCG/ACT  inhaler 2 puff  2 puff Inhalation Q4H Charm Rings, NP   2 puff at 06/24/16 1133  . alum & mag hydroxide-simeth (MAALOX/MYLANTA) 200-200-20 MG/5ML suspension 30 mL  30 mL Oral Q6H PRN Kerry Hough, PA-C   30 mL at 06/16/16 1859  . amLODipine (NORVASC) tablet 10 mg  10 mg Oral Daily Narda Bonds, MD   10 mg at 06/24/16 6962  . cefpodoxime (VANTIN) tablet 200 mg  200 mg Oral Q12H Craige Cotta, MD   200 mg at 06/24/16 9528  . diphenhydrAMINE (BENADRYL) capsule 25 mg  25 mg Oral Q6H PRN Craige Cotta, MD   25 mg at 06/23/16 1315  . famotidine (PEPCID) tablet  20 mg  20 mg Oral BID Craige Cotta, MD   20 mg at 06/24/16 4132  . feeding supplement (ENSURE ENLIVE) (ENSURE ENLIVE) liquid 237 mL  237 mL Oral BID BM Sanjuana Kava, NP   237 mL at 06/24/16 1100  . FLUoxetine (PROZAC) capsule 20 mg  20 mg Oral Daily Georgiann Cocker, MD   20 mg at 06/24/16 0821  . gabapentin (NEURONTIN) capsule 200 mg  200 mg Oral BID Charm Rings, NP   200 mg at 06/24/16 4401  . guaiFENesin (MUCINEX) 12 hr tablet 1,200 mg  1,200 mg Oral BID PRN Narda Bonds, MD      . hydrochlorothiazide (HYDRODIURIL) tablet 25 mg  25 mg Oral Daily Charm Rings, NP   25 mg at 06/24/16 0272  . magnesium hydroxide (MILK OF MAGNESIA) suspension 30 mL  30 mL Oral Daily PRN Charm Rings, NP      . nicotine (NICODERM CQ - dosed in mg/24 hours) patch 21 mg  21 mg Transdermal Daily Sanjuana Kava, NP   21 mg at 06/24/16 0823  . ondansetron (ZOFRAN) tablet 4 mg  4 mg Oral Q8H PRN Charm Rings, NP      . risperiDONE (RISPERDAL) tablet 0.5 mg  0.5 mg Oral BH-q7a Rockey Situ Cobos, MD   0.5 mg at 06/24/16 5366  . risperiDONE (RISPERDAL) tablet 1 mg  1 mg Oral QHS Craige Cotta, MD   1 mg at 06/23/16 2226  . traZODone (DESYREL) tablet 100 mg  100 mg Oral QHS PRN Craige Cotta, MD   100 mg at 06/23/16 2225    Lab Results:  No results found for this or any previous visit (from the past 48 hour(s)).  Blood Alcohol level:  Lab Results  Component Value Date   ETH <5 06/11/2016   ETH <11 11/17/2011    Metabolic Disorder Labs: No results found for: HGBA1C, MPG Lab Results  Component Value Date   PROLACTIN 35.6 (H) 06/18/2016   Lab Results  Component Value Date   CHOL 216 (H) 06/18/2016   TRIG 45 06/18/2016   HDL 76 06/18/2016   CHOLHDL 2.8 06/18/2016   VLDL 9 06/18/2016   LDLCALC 131 (H) 06/18/2016   LDLCALC 99 07/15/2008    Physical Findings: AIMS: Facial and Oral Movements Muscles of Facial Expression: None, normal Lips and Perioral Area: None, normal Jaw: None,  normal Tongue: None, normal,Extremity Movements Upper (arms, wrists, hands, fingers): None, normal Lower (legs, knees, ankles, toes): None, normal, Trunk Movements Neck, shoulders, hips: None, normal, Overall Severity Severity of abnormal movements (highest score from questions above): None, normal Incapacitation due to abnormal movements: None, normal Patient's awareness of abnormal movements (rate only patient's report): No Awareness, Dental Status  Current problems with teeth and/or dentures?: No Does patient usually wear dentures?: No  CIWA:  CIWA-Ar Total: 1 COWS:  COWS Total Score: 2  Musculoskeletal: Strength & Muscle Tone: within normal limits Gait & Station: normal Patient leans: N/A  Psychiatric Specialty Exam: Physical Exam  Constitutional: He is oriented to person, place, and time. He appears well-developed and well-nourished.  HENT:  Head: Normocephalic and atraumatic.  Eyes: Conjunctivae and EOM are normal. Pupils are equal, round, and reactive to light.  Neck: Normal range of motion.  Cardiovascular: Normal rate, regular rhythm and normal heart sounds.   Respiratory: Effort normal and breath sounds normal.  GI: Soft. Bowel sounds are normal.  Musculoskeletal: Normal range of motion.  Neurological: He is alert and oriented to person, place, and time. He has normal reflexes.  Skin: Skin is warm and dry.  Psychiatric: He has a normal mood and affect. His behavior is normal.  As above.     Review of Systems  Psychiatric/Behavioral: Depression: stable. Nervous/anxious: stable.    no fever, no chills, no shortness of breath or wheezing, no dental pain, no mucosal involvement   Blood pressure (!) 145/85, pulse (!) 115, temperature 98 F (36.7 C), temperature source Oral, resp. rate 18, height  (1.651 m), weight 57.6 kg (127 lb), SpO2 96 %.Body mass index is 21.13 kg/m.  General Appearance: well groomed   Eye Contact:  Good   Speech: normal    Volume:   Normal    Mood: pleasant but flat  Affect:   congruent     Thought Process:  Goal Directed  Orientation:  Full (Time, Place, and Person)  Thought Content: patient is future oriented. Denies delusional   Suicidal Thoughts:  No   Homicidal Thoughts:  No  Memory:  Recent and remote grossly intact   Judgement: improving    Insight:   Improving   Psychomotor Activity: normal   Concentration:  Good   Recall:  Good   Fund of Knowledge:  Good   Language:  Good  Akathisia:  No  Handed:  Right   AIMS (if indicated):     Assets:  Desire for Improvement Resilience  ADL's:  Intact  Cognition:  WNL  Sleep:  Number of Hours: 5.75     I agree with current treatment plan on 06/24/2016, Patient seen face-to-face for psychiatric evaluation follow-up, chart reviewed. Reviewed the information documented and agree with the treatment plan.  Treatment Plan Summary: Treatment plan reviewed as below today 06/24/2016 continue current treatment plane excepted where noted.   Encourage ongoing group and milieu participation to work on coping skills and symptom reduction Continue to encourage efforts to work on sobriety and relapse prevention  Treatment team working on disposition planning  Continue Prozac 20 mgrs QDAY for depression Continue Neurontin 200 mgrs BID for mood disorder , anxiety  Continue Risperidone  0.5 mgrs QAM and 1 mgr QHS  for mood disorder, psychotic symptoms Continue Trazodone 100  mgrs QHS PRN for insomnia  I have discussed case with hospitalist- recommendation is Benadryl 25 mgrs Q 6 hours PRN , start Pepcid 20 mgrs BID, and monitor.  Will repeat EKG in AM to monitor QTc ( while on Levaquin )  At this time plan is for early discharge on Monday AM to go to [facility] .   Oneta Rack, NP 06/24/2016, 12:56 PM   MD notes:  4/20 /18 at 4: 00 PM  Discussed facial swelling, angioedema with hospitalist , Dr. Butler Denmark-  it is now much improved following benadryl/pepcid. It is felt to  possibly be related to Lisinopril management, but could also be secondary to Levaquin, which was started recently for pneumonia treatment. Recommendation was to stop Lisinopril, stop Levaquin, and switch to [facility] for ongoing treatment .

## 2016-06-24 NOTE — Progress Notes (Signed)
Pt attended the evening AA speaker meeting. Pt was engaged and appropriate. Caswell Corwin, NT 06/24/16 9:16 PM

## 2016-06-25 MED ORDER — CEFPODOXIME PROXETIL 200 MG PO TABS: 200.0000 mg | ORAL_TABLET | Freq: Two times a day (BID) | ORAL | 0 refills | Status: DC

## 2016-06-25 MED ORDER — GUAIFENESIN ER 600 MG PO TB12: 1200.0000 mg | ORAL_TABLET | Freq: Two times a day (BID) | ORAL | 0 refills | Status: DC | PRN

## 2016-06-25 MED ORDER — AMLODIPINE BESYLATE 10 MG PO TABS: 10.0000 mg | ORAL_TABLET | Freq: Every day | ORAL | 0 refills | Status: DC

## 2016-06-25 MED ORDER — HYDROCHLOROTHIAZIDE 12.5 MG PO CAPS
12.5000 mg | ORAL_CAPSULE | Freq: Every evening | ORAL | Status: DC
  Administered 2016-06-25: 12.5 mg via ORAL
  Filled 2016-06-25: qty 1

## 2016-06-25 MED ORDER — DIVALPROEX SODIUM ER 250 MG PO TB24
750.0000 mg | ORAL_TABLET | Freq: Every day | ORAL | Status: DC
  Administered 2016-06-25: 750 mg via ORAL
  Filled 2016-06-25 (×2): qty 3

## 2016-06-25 MED ORDER — RANITIDINE HCL 150 MG PO TABS
150.0000 mg | ORAL_TABLET | Freq: Two times a day (BID) | ORAL | Status: DC
  Filled 2016-06-25 (×2): qty 1

## 2016-06-25 MED ORDER — DIVALPROEX SODIUM ER 250 MG PO TB24: 750.0000 mg | ORAL_TABLET | Freq: Every day | ORAL | 0 refills | Status: DC

## 2016-06-25 MED ORDER — RISPERIDONE 1 MG PO TABS: 1.0000 mg | ORAL_TABLET | Freq: Every day | ORAL | 0 refills | Status: DC

## 2016-06-25 MED ORDER — RISPERIDONE 0.5 MG PO TABS: 0.5000 mg | ORAL_TABLET | ORAL | 0 refills | Status: DC

## 2016-06-25 NOTE — Progress Notes (Signed)
Pt. woke up this a.m. with an intermittent dry cough. Non-productive and strong. Refused albuterol. No other c/o. Will monitor.

## 2016-06-25 NOTE — Progress Notes (Signed)
  DATA ACTION RESPONSE  Objective- Pt. is visible in the dayroom, seen watching TV. Presents with a irritable/agitated/anxious affect and mood. Pt. was minimal and guarded with interaction. Subjective- Denies having any SI/HI/AVH/Pain at this time. Pt. states " Why do things all of a sudden change when you get here?" Pt. was irritable that shoes got put up in locker due to velcro and was deemed unsafe by staff. Remain safe on the unit.  1:1 interaction in private to establish rapport. Encouragement, education, & support given from staff. Meds. ordered and administered. PRN Trazodone requested and will re-eval accordingly. Will continue to monitor facial swelling. No new c/o. No SOB.   Safety maintained with Q 15 checks. Continues to follow treatment plan and will monitor closely. No additonal questions/concerns noted.

## 2016-06-25 NOTE — [facility] Suicide Risk Assessment (Signed)
[facility] Discharge Suicide Risk Assessment   Principal Problem: Substance-induced psychotic disorder with hallucinations [facility]) Discharge Diagnoses:  Patient Active Problem List   Diagnosis Date Noted  . 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 Major depressive disorder, recurrent episode, severe, with psychosis (HCC) [F33.3] 06/12/2016  . Cocaine use disorder, severe, dependence (HCC) [F14.20] 06/12/2016  . Major depressive disorder, recurrent severe without psychotic features (HCC) [F33.2] 06/12/2016  . Seizure (HCC) [R56.9] 11/17/2011  . Tobacco abuse [Z72.0] 11/17/2011  . Alcohol abuse [F10.10] 11/17/2011  . Cocaine abuse [F14.10] 11/17/2011  . Bipolar 2 disorder (HCC) [F31.81] 11/17/2011  . Hypertension [I10] 11/17/2011  . Hydrocephalus [G91.9] 11/17/2011  . Aspiration pneumonia (HCC) [J69.0] 11/17/2011    Total Time spent with patient: 30 minutes  Musculoskeletal: Strength & Muscle Tone: within normal limits Gait & Station: normal Patient leans: N/A  Psychiatric Specialty Exam: Review of Systems  Psychiatric/Behavioral: Positive for substance abuse.  All other systems reviewed and are negative.   Blood pressure (!) 150/100, pulse (!) 108, temperature 98 F (36.7 C), temperature source Oral, resp. rate 18, height  (1.651 m), weight 57.6 kg (127 lb), SpO2 96 %.Body mass index is 21.13 kg/m.  General Appearance: Casual  Eye Contact::  Fair  Speech:  Clear and Coherent409  Volume:  Normal  Mood:  Euthymic  Affect:  Appropriate  Thought Process:  Goal Directed and Descriptions of Associations: Intact  Orientation:  Full (Time, Place, and Person)  Thought Content:  Logical  Suicidal Thoughts:  No  Homicidal Thoughts:  No  Memory:  Immediate;   Fair Recent;   Fair Remote;   Fair  Judgement:  Fair  Insight:  Fair  Psychomotor Activity:  Normal  Concentration:  Fair  Recall:  [employer] of Knowledge:Fair  Language: Fair   Akathisia:  No  Handed:  Right  AIMS (if indicated):     Assets:  Communication Skills Desire for Improvement  Sleep:  Number of Hours: 5.5  Cognition: WNL  ADL's:  Intact   Mental Status Per Nursing Assessment::   On Admission:     Demographic Factors:  Male  Loss Factors: NA  Historical Factors: NA  Risk Reduction Factors:   Positive therapeutic relationship  Continued Clinical Symptoms:  Alcohol/Substance Abuse/Dependencies Previous Psychiatric Diagnoses and Treatments Medical Diagnoses and Treatments/Surgeries  Cognitive Features That Contribute To Risk:  None    Suicide Risk:  Minimal: No identifiable suicidal ideation.  Patients presenting with no risk factors but with morbid ruminations; may be classified as minimal risk based on the severity of the depressive symptoms  Follow-up Information    [facility] Follow up on 06/26/2016.   Why:  Screening for possible admission on Monday at 7:45AM. Please bring photo ID/proof of [place] residence and [place] card. Please bring 2 weeks of medications and 30 day prescription supplied to you by the hospital. Thank you.  Contact information: [address] [place] [address] [phone]        [facility] Follow up.   Specialty:  Behavioral Health Why:  Walk in within 7 days of hospital or treatment facility discharge to be assessed for mental health services including Medication management and counseling. Walk in hours: Monday through Friday 8am-9am. Thank you.  Contact information: [address] [place] [address] [phone]           Plan Of Care/Follow-up recommendations:  Activity:  no restrictions Diet:  regular Tests:  Depakote level  needs to be monitored , next level on 06/30/16, in the morning Other:  follow up with aftercare as needed  Adiyah Lame, MD 06/25/2016, 3:00 PM

## 2016-06-25 NOTE — [facility] Group Notes (Signed)
    [facility] LCSW Group Therapy  06/25/2016  10 AM  Type of Therapy:  Group Therapy  Participation Level:  Active  Participation Quality:  Monopolizing  Affect:  Appropriate  Cognitive:  Alert and Oriented  Insight:  Limited  Engagement in Therapy:  Monopolizing  Modes of Intervention:  Clarification, Discussion, Limit-setting, Socialization and Support  Summary of Progress/Problems: Topic for today was thoughts and feelings regarding discharge. We discussed fears of upcoming changes including judgements, expectations and stigma of mental health issues. We then discussed supports: what constitutes a supportive framework, identification of supports and what to do when others are not supportive. Pt engaged easily during group session. As patients processed their anxiety patient shared intention to approach treatment with an open mind verses 'trying to change the whole system like I did last time." Patient chose a visual to represent decompensation as his addiction and improvement as the journey/or path of recovery.   Carney Bern, LCSW

## 2016-06-25 NOTE — Progress Notes (Addendum)
[facility] MD Progress Note  06/25/2016 11:01 AM Brian Lucero  MRN:  [id number]   Subjective: Patient states " I feel better."    Objective:Patient seen and chart reviewed.Discussed patient with treatment team.  Pt today seen as less anxious , goal directed , wants to get help with his substance abuse issues. Pt denies new concerns , reports being compliant on medications. He denies any ADRS. He reports he is sleeping ok and his appetite is fair. Per staff , no new concerns.     Principal Problem: Substance Induced Psychotic Disorder( cocaine) Diagnosis:   Patient Active Problem List   Diagnosis Date Noted  . 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 Major depressive disorder, recurrent episode, severe, with psychosis (HCC) [F33.3] 06/12/2016  . Cocaine use disorder, severe, dependence (HCC) [F14.20] 06/12/2016  . Major depressive disorder, recurrent severe without psychotic features (HCC) [F33.2] 06/12/2016  . Seizure (HCC) [R56.9] 11/17/2011  . Tobacco abuse [Z72.0] 11/17/2011  . Alcohol abuse [F10.10] 11/17/2011  . Cocaine abuse [F14.10] 11/17/2011  . Bipolar 2 disorder (HCC) [F31.81] 11/17/2011  . Hypertension [I10] 11/17/2011  . Hydrocephalus [G91.9] 11/17/2011  . Aspiration pneumonia (HCC) [J69.0] 11/17/2011   Total Time spent with patient: 20 minutes  Past Psychiatric History: As in H&P  Past Medical History:  Past Medical History:  Diagnosis Date  . Anxiety   . Bipolar 2 disorder (HCC)   . Depression   . Hypertension   . Pneumothorax    as a baby in the 61's  . Seizures (HCC)    History reviewed. No pertinent surgical history. Family History:  Family History  Problem Relation Age of [place]  . Diabetes type II Mother   . Diabetes type II     Family Psychiatric  History: As in H&P Social History:  History  Alcohol Use  . 1.2 oz/week  . 2 Cans of beer per week    Comment: 1 40oz Fri. and Sat.     History  Drug  Use  . Types: Cocaine    Comment: Used yesterday    Social History   Social History  . Marital status: Single    Spouse name: N/A  . Number of children: N/A  . Years of education: N/A   Social History Main Topics  . Smoking status: Current Every Day Smoker    Packs/day: 0.20    Types: Cigarettes  . Smokeless tobacco: Never Used  . Alcohol use 1.2 oz/week    2 Cans of beer per week     Comment: 1 40oz Fri. and Sat.  . Drug use: Yes    Types: Cocaine     Comment: Used yesterday  . Sexual activity: Yes   Other Topics Concern  . None   Social History Narrative  . None   Additional Social History:   Single, lives alone. Never married. No kids. Not in any relationship. No support from his family. On disability. No legal issues. No other stressors.   Sleep:improved   Appetite:  Fair  Current Medications: Current Facility-Administered Medications  Medication Dose Route Frequency Provider Last Rate Last Dose  . acetaminophen (TYLENOL) tablet 650 mg  650 mg Oral Q4H PRN Charm Rings, NP   650 mg at 06/21/16 2200  . albuterol (PROVENTIL HFA;VENTOLIN HFA) 108 (90 Base) MCG/ACT inhaler 2 puff  2 puff Inhalation Q4H Charm Rings, NP   2 puff at 06/25/16 0841  . alum & mag hydroxide-simeth (MAALOX/MYLANTA) 200-200-20 MG/5ML suspension 30  mL  30 mL Oral Q6H PRN Kerry Hough, PA-C   30 mL at 06/16/16 1859  . amLODipine (NORVASC) tablet 10 mg  10 mg Oral Daily Narda Bonds, MD   10 mg at 06/25/16 0842  . cefpodoxime (VANTIN) tablet 200 mg  200 mg Oral Q12H Craige Cotta, MD   200 mg at 06/25/16 0841  . diphenhydrAMINE (BENADRYL) capsule 25 mg  25 mg Oral Q6H PRN Craige Cotta, MD   25 mg at 06/23/16 1315  . divalproex (DEPAKOTE ER) 24 hr tablet 750 mg  750 mg Oral QHS Tieara Flitton, MD      . famotidine (PEPCID) tablet 20 mg  20 mg Oral BID Craige Cotta, MD   20 mg at 06/25/16 0841  . feeding supplement (ENSURE ENLIVE) (ENSURE ENLIVE) liquid 237 mL  237 mL Oral BID  BM Sanjuana Kava, NP   237 mL at 06/24/16 1447  . FLUoxetine (PROZAC) capsule 20 mg  20 mg Oral Daily Georgiann Cocker, MD   20 mg at 06/25/16 0842  . gabapentin (NEURONTIN) capsule 200 mg  200 mg Oral BID Charm Rings, NP   200 mg at 06/25/16 0841  . guaiFENesin (MUCINEX) 12 hr tablet 1,200 mg  1,200 mg Oral BID PRN Narda Bonds, MD      . hydrochlorothiazide (HYDRODIURIL) tablet 25 mg  25 mg Oral Daily Charm Rings, NP   25 mg at 06/25/16 0842  . magnesium hydroxide (MILK OF MAGNESIA) suspension 30 mL  30 mL Oral Daily PRN Charm Rings, NP      . nicotine (NICODERM CQ - dosed in mg/24 hours) patch 21 mg  21 mg Transdermal Daily Sanjuana Kava, NP   21 mg at 06/24/16 0823  . ondansetron (ZOFRAN) tablet 4 mg  4 mg Oral Q8H PRN Charm Rings, NP      . risperiDONE (RISPERDAL) tablet 0.5 mg  0.5 mg Oral BH-q7a Rockey Situ Cobos, MD   0.5 mg at 06/25/16 0602  . risperiDONE (RISPERDAL) tablet 1 mg  1 mg Oral QHS Craige Cotta, MD   1 mg at 06/24/16 2118  . traZODone (DESYREL) tablet 100 mg  100 mg Oral QHS PRN Craige Cotta, MD   100 mg at 06/24/16 2118    Lab Results:  No results found for this or any previous visit (from the past 48 hour(s)).  Blood Alcohol level:  Lab Results  Component Value Date   ETH <5 06/11/2016   ETH <11 11/17/2011    Metabolic Disorder Labs: No results found for: HGBA1C, MPG Lab Results  Component Value Date   PROLACTIN 35.6 (H) 06/18/2016   Lab Results  Component Value Date   CHOL 216 (H) 06/18/2016   TRIG 45 06/18/2016   HDL 76 06/18/2016   CHOLHDL 2.8 06/18/2016   VLDL 9 06/18/2016   LDLCALC 131 (H) 06/18/2016   LDLCALC 99 07/15/2008    Physical Findings: AIMS: Facial and Oral Movements Muscles of Facial Expression: None, normal Lips and Perioral Area: None, normal Jaw: None, normal Tongue: None, normal,Extremity Movements Upper (arms, wrists, hands, fingers): None, normal Lower (legs, knees, ankles, toes): None, normal, Trunk  Movements Neck, shoulders, hips: None, normal, Overall Severity Severity of abnormal movements (highest score from questions above): None, normal Incapacitation due to abnormal movements: None, normal Patient's awareness of abnormal movements (rate only patient's report): No Awareness, Dental Status Current problems with teeth and/or dentures?: No Does  patient usually wear dentures?: No  CIWA:  CIWA-Ar Total: 1 COWS:  COWS Total Score: 2  Musculoskeletal: Strength & Muscle Tone: within normal limits Gait & Station: normal Patient leans: N/A  Psychiatric Specialty Exam: Physical Exam  Nursing note and vitals reviewed. Neurological: He has normal reflexes.  Psychiatric:  As above.     Review of Systems  Psychiatric/Behavioral: Positive for depression (improving) and substance abuse. The patient is nervous/anxious (improving).   All other systems reviewed and are negative.   Blood pressure (!) 150/100, pulse (!) 108, temperature 98 F (36.7 C), temperature source Oral, resp. rate 18, height  (1.651 m), weight 57.6 kg (127 lb), SpO2 96 %.Body mass index is 21.13 kg/m.  General Appearance: Fair  Eye Contact:  fair  Speech: normal rate rhythm, volume normal  Volume:  normal  Mood: less anxious  Affect:   congruent  Thought Process:  Linear and Descriptions of Associations: Intact  Orientation:  Full (Time, Place, and Person)  Thought Content:denies rumination  Suicidal Thoughts:  No   Homicidal Thoughts:  No  Memory:  Immediate, recent, remote - fair  Judgement: improving  Insight:  improving  Psychomotor Activity:normal  Concentration: fair  Recall:  fair  Fund of Knowledge: fair  Language:  Good  Akathisia:  No  Handed: right  AIMS (if indicated):     Assets:  Desire for Improvement Resilience  ADL's:  Intact  Cognition:  WNL  Sleep:  Number of Hours: 5.5     Substance-induced psychotic disorder with hallucinations (HCC) improving   Treatment Plan  Summary:  Will continue Prozac 20 mg po daily for depression/anxiety sx. Will continue Neurontin 200 mg po bid for anxiety sx/mood lability. Risperidone 0.5 mg po qam and 1 mg po qhs for mood sx, psychosis. Trazodone 100 mg po qhs prn for sleep. Restarted Depakote ER 750 mg po qhs , reports he was taking it for seizures. depakote level in 5 days. EKG reviewed - qtc- wnl. CSW to continue to work on disposition. [facility] discharged to [facility].    Maveric Debono, MD 06/25/2016, 11:01 AM

## 2016-06-25 NOTE — Discharge Summary (Signed)
Physician Discharge Summary Note  Patient:  Brian Lucero is an 46 y.o., male MRN:  [id number] DOB:  1970-07-18 Patient phone:  [phone] (home)  Patient address:   [address] [place] [address],  Total Time spent with patient: 30 minutes  Date of Admission:  06/12/2016 Date of Discharge: 06/26/2016  Reason for Admission: Per H&P- This is an admission assessment for this 46 year old AA male with hx of drug use. He is admitted to the [facility] adult unit from the [facility] with complaints of  Cough, worsening depression & auditory hallucinations. He denies any prior psychiatric hospitalization, however, admits previous substance abuse treatment at the [facility] treatment center. He denies any sobriety times. During this assessment, Brian Lucero reports, "The ambulance took me to the hospital 2 days ago. I don't know who called the ambulance. I was just walking up the streets. I don't remember what was happening at the time. I have emotional stress for over a month now. I don't know the cause the emotional stress. I have drug abuse issues. It started when I was 46 years old, I have been smoking crack cocaine. I have been to the [facility] treatment center a year before last. I have never had treatment for depression. I had attempted suicide I month ago by overdose. I was not hospitalized. I ain't answering any more questions".  Principal Problem: Substance-induced psychotic disorder with hallucinations [facility]) Discharge Diagnoses: Patient Active Problem List   Diagnosis Date Noted  . 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 Major depressive disorder, recurrent episode, severe, with psychosis (HCC) [F33.3] 06/12/2016  . Cocaine use disorder, severe, dependence (HCC) [F14.20] 06/12/2016  . Major depressive disorder, recurrent severe without psychotic features (HCC) [F33.2] 06/12/2016  . Seizure (HCC) [R56.9] 11/17/2011  .  Tobacco abuse [Z72.0] 11/17/2011  . Alcohol abuse [F10.10] 11/17/2011  . Cocaine abuse [F14.10] 11/17/2011  . Bipolar 2 disorder (HCC) [F31.81] 11/17/2011  . Hypertension [I10] 11/17/2011  . Hydrocephalus [G91.9] 11/17/2011  . Aspiration pneumonia (HCC) [J69.0] 11/17/2011    Past Psychiatric History:  Past Medical History:  Past Medical History:  Diagnosis Date  . Anxiety   . Bipolar 2 disorder (HCC)   . Depression   . Hypertension   . Pneumothorax    as a baby in the 43's  . Seizures (HCC)    History reviewed. No pertinent surgical history. Family History:  Family History  Problem Relation Age of Onset  . Diabetes type II Mother   . Diabetes type II     Family Psychiatric  History:  Social History:  History  Alcohol Use  . 1.2 oz/week  . 2 Cans of beer per week    Comment: 1 40oz Fri. and Sat.     History  Drug Use  . Types: Cocaine    Comment: Used yesterday    Social History   Social History  . Marital status: Single    Spouse name: N/A  . Number of children: N/A  . Years of education: N/A   Social History Main Topics  . Smoking status: Current Every Day Smoker    Packs/day: 0.20    Types: Cigarettes  . Smokeless tobacco: Never Used  . Alcohol use 1.2 oz/week    2 Cans of beer per week     Comment: 1 40oz Fri. and Sat.  . Drug use: Yes    Types: Cocaine     Comment: Used yesterday  . Sexual activity: Yes  Other Topics Concern  . None   Social History Narrative  . None    Hospital Course:  Brian Lucero was admitted for Substance-induced psychotic disorder with hallucinations (HCC) and crisis management.  Pt was treated discharged with the medications listed below under Medication List.  Medical problems were identified and treated as needed.  Home medications were restarted as appropriate.  Improvement was monitored by observation and Brian Lucero 's daily report of symptom reduction.  Emotional and mental status was monitored by daily  self-inventory reports completed by Brian Lucero and clinical staff.         Brian Lucero was evaluated by the treatment team for stability and plans for continued recovery upon discharge. Brian Lucero 's motivation was an integral factor for scheduling further treatment. Employment, transportation, bed availability, health status, family support, and any pending legal issues were also considered during hospital stay. Pt was offered further treatment options upon discharge including but not limited to Residential, Intensive Outpatient, and Outpatient treatment.  Brian Lucero will follow up with the services as listed below under Follow Up Information.     Upon completion of this admission the patient was both mentally and medically stable for discharge denying suicidal/homicidal ideation, auditory/visual/tactile hallucinations, delusional thoughts and paranoia.    Brian Lucero responded well to treatment with Depakote 750 mg, Neurontin 200 mg and Risperdal 1 mg QHS an 0.5 Q Am without adverse effects. Pt demonstrated improvement without reported or observed adverse effects to the point of stability appropriate for outpatient management. Pertinent labs include: Depakote -pending , Prolactin35.6 (high)  for which outpatient follow-up is necessary for lab recheck as mentioned below. Reviewed CBC, CMP, BAL, and UDS+ for opiates and cocaine; all unremarkable aside from noted exceptions.   Physical Findings: AIMS: Facial and Oral Movements Muscles of Facial Expression: None, normal Lips and Perioral Area: None, normal Jaw: None, normal Tongue: None, normal,Extremity Movements Upper (arms, wrists, hands, fingers): None, normal Lower (legs, knees, ankles, toes): None, normal, Trunk Movements Neck, shoulders, hips: None, normal, Overall Severity Severity of abnormal movements (highest score from questions above): None, normal Incapacitation due to abnormal movements: None, normal Patient's awareness  of abnormal movements (rate only patient's report): No Awareness, Dental Status Current problems with teeth and/or dentures?: No Does patient usually wear dentures?: No  CIWA:  CIWA-Ar Total: 1 COWS:  COWS Total Score: 2  Musculoskeletal: Strength & Muscle Tone: within normal limits Gait & Station: normal Patient leans: N/A  Psychiatric Specialty Exam: See SRA by MD Physical Exam  Nursing note and vitals reviewed. Constitutional: He is oriented to person, place, and time. He appears well-developed.  Neurological: He is alert and oriented to person, place, and time.  Psychiatric: He has a normal mood and affect. His behavior is normal.    Review of Systems  Psychiatric/Behavioral: Negative for depression (stable). The patient is not nervous/anxious (stable).     Blood pressure (!) 150/100, pulse (!) 108, temperature 98 F (36.7 C), temperature source Oral, resp. rate 18, height  (1.651 m), weight 57.6 kg (127 lb), SpO2 96 %.Body mass index is 21.13 kg/m.   Have you used any form of tobacco in the last 30 days? (Cigarettes, Smokeless Tobacco, Cigars, and/or Pipes): Yes  Has this patient used any form of tobacco in the last 30 days? (Cigarettes, Smokeless Tobacco, Cigars, and/or Pipes) Yes, No  Blood Alcohol level:  Lab Results  Component Value Date   ETH <5 06/11/2016   ETH <11 11/17/2011  Metabolic Disorder Labs:  No results found for: HGBA1C, MPG Lab Results  Component Value Date   PROLACTIN 35.6 (H) 06/18/2016   Lab Results  Component Value Date   CHOL 216 (H) 06/18/2016   TRIG 45 06/18/2016   HDL 76 06/18/2016   CHOLHDL 2.8 06/18/2016   VLDL 9 06/18/2016   LDLCALC 131 (H) 06/18/2016   LDLCALC 99 07/15/2008    See Psychiatric Specialty Exam and Suicide Risk Assessment completed by Attending Physician prior to discharge.  Discharge destination:  [facility]  Is patient on multiple antipsychotic therapies at discharge:  No   Has Patient had three  or more failed trials of antipsychotic monotherapy by history:  No  Recommended Plan for Multiple Antipsychotic Therapies: NA  Discharge Instructions    Diet - low sodium heart healthy    Complete by:  As directed    Discharge instructions    Complete by:  As directed    Take all medications as prescribed. Keep all follow-up appointments as scheduled.  Do not consume alcohol or use illegal drugs while on prescription medications. Report any adverse effects from your medications to your primary care provider promptly.  In the event of recurrent symptoms or worsening symptoms, call 911, a crisis hotline, or go to the nearest emergency department for evaluation.   Increase activity slowly    Complete by:  As directed      Allergies as of 06/25/2016      Reactions   Levofloxacin Swelling   Lisinopril Swelling      Medication List    STOP taking these medications   divalproex 500 MG DR tablet Commonly known as:  DEPAKOTE Replaced by:  divalproex 250 MG 24 hr tablet   levofloxacin 750 MG tablet Commonly known as:  LEVAQUIN   sertraline 100 MG tablet Commonly known as:  ZOLOFT     TAKE these medications     Indication  albuterol 108 (90 Base) MCG/ACT inhaler Commonly known as:  PROVENTIL HFA;VENTOLIN HFA Inhale 2 puffs into the lungs every 4 (four) hours. For shortness of breath  Indication:  Asthma   amLODipine 10 MG tablet Commonly known as:  NORVASC Take 1 tablet (10 mg total) by mouth daily. Start taking on:  06/26/2016  Indication:  High Blood Pressure Disorder   cefpodoxime 200 MG tablet Commonly known as:  VANTIN Take 1 tablet (200 mg total) by mouth every 12 (twelve) hours.  Indication:  Lower Respiratory Tract Infection   diphenhydrAMINE 25 MG tablet Commonly known as:  BENADRYL Take 1 tablet (25 mg total) by mouth every 6 (six) hours as needed for itching.    divalproex 250 MG 24 hr tablet Commonly known as:  DEPAKOTE ER Take 3 tablets (750 mg total) by  mouth at bedtime. Replaces:  divalproex 500 MG DR tablet  Indication:  Manic Phase of Manic-Depression   FLUoxetine 20 MG capsule Commonly known as:  PROZAC Take 1 capsule (20 mg total) by mouth daily. For depression  Indication:  Major Depressive Disorder   gabapentin 100 MG capsule Commonly known as:  NEURONTIN Take 2 capsules (200 mg total) by mouth 2 (two) times daily. For agitation  Indication:  Agitation   guaiFENesin 600 MG 12 hr tablet Commonly known as:  MUCINEX Take 2 tablets (1,200 mg total) by mouth 2 (two) times daily as needed for cough or to loosen phlegm.  Indication:  Cough   hydrochlorothiazide 25 MG tablet Commonly known as:  HYDRODIURIL Take 1 tablet (25  mg total) by mouth daily. For high blood pressure What changed:  additional instructions  Indication:  High Blood Pressure Disorder   lisinopril 20 MG tablet Commonly known as:  PRINIVIL,ZESTRIL Take 1 tablet (20 mg total) by mouth daily. For high blood pressure What changed:  additional instructions  Indication:  High Blood Pressure Disorder   nicotine 21 mg/24hr patch Commonly known as:  NICODERM CQ - dosed in mg/24 hours Place 1 patch (21 mg total) onto the skin daily. For smoking cessation  Indication:  Nicotine Addiction   ranitidine 150 MG capsule Commonly known as:  ZANTAC Take 1 capsule (150 mg total) by mouth 2 (two) times daily.    risperiDONE 1 MG tablet Commonly known as:  RISPERDAL Take 1 tablet (1 mg total) by mouth at bedtime.  Indication:  Manic-Depression   risperiDONE 0.5 MG tablet Commonly known as:  RISPERDAL Take 1 tablet (0.5 mg total) by mouth every morning. Start taking on:  06/26/2016  Indication:  Major Depressive Disorder   traZODone 50 MG tablet Commonly known as:  DESYREL Take 1 tablet (50 mg) at bedtime: For sleep  Indication:  Trouble Sleeping      Follow-up Information    [facility] Follow up on 06/26/2016.   Why:  Screening for possible  admission on Monday at 7:45AM. Please bring photo ID/proof of [place] residence and [place] card. Please bring 2 weeks of medications and 30 day prescription supplied to you by the hospital. Thank you.  Contact information: [address] [place] [address] [phone]        [facility] Follow up.   Specialty:  Behavioral Health Why:  Walk in within 7 days of hospital or treatment facility discharge to be assessed for mental health services including Medication management and counseling. Walk in hours: Monday through Friday 8am-9am. Thank you.  [address] [place] [address] [phone]           Follow-up recommendations:  Activity:  as tolerated Diet:  heart healthy  Comments: Take all medications as prescribed. Keep all follow-up appointments as scheduled.  Do not consume alcohol or use illegal drugs while on prescription medications. Report any adverse effects from your medications to your primary care provider promptly.  In the event of recurrent symptoms or worsening symptoms, call 911, a crisis hotline, or go to the nearest emergency department for evaluation.   Signed: Oneta Rack, NP 06/25/2016, 5:18 PM

## 2016-06-25 NOTE — [facility] Group Notes (Signed)
Goalsw Group  Date:  06/25/2016  Time:  1300  Type of Therapy:  Nurse Education/  Goals group The group focuses on teaching patients how to set attainable goals and then how to develop skills needed to meet their goals.   Participation Level:  Active  Participation Quality:  Appropriate  Affect:  Anxious  Cognitive:  Alert  Insight:  Good  Engagement in Group:  Engaged  Modes of Intervention:  Education  Summary of Progress/Problems:  Brian Lucero 06/25/2016, 6:45 PM

## 2016-06-25 NOTE — Progress Notes (Signed)
Patient ID: Brian Lucero, male   DOB: [birth year], 46 y.o.   MRN: [id number] D:Affect is angry at times,easily irritated.Requires prompts to take medications or attend groups but he usually will comply. Attitude and affect improved throughout the day. Denies a/v hallucinations and contracts for safety saying he is ready to go tomorrow. A:Support and encouragement offered. R:Receptive. No complaints of pain or problems at this time.

## 2016-06-26 NOTE — Progress Notes (Signed)
Pt was discharged from [facility] and [facility] via cab with a voucher    He denies suicidal and homocidal ideation    He denies any other physical complaints   He received his 8 am medications prior to leaving and received nourishment   The cab was called around 6 am to be here by 7 am    He received all follow up information, medications and teaching sheets    He received all belongings and signed his belongings sheet as well as his AVS sheet

## 2016-09-07 ENCOUNTER — Encounter (HOSPITAL_COMMUNITY): Payer: Self-pay | Admitting: Emergency Medicine

## 2016-09-07 ENCOUNTER — Emergency Department (HOSPITAL_COMMUNITY)
Admission: EM | Admit: 2016-09-07 | Discharge: 2016-09-07 | Disposition: A | Discharge status: Other Acute Inpatient Hospital | Payer: Medicare HMO | Attending: Emergency Medicine | Admitting: Emergency Medicine

## 2016-09-07 ENCOUNTER — Encounter (HOSPITAL_COMMUNITY): Payer: Self-pay | Admitting: *Deleted

## 2016-09-07 ENCOUNTER — Inpatient Hospital Stay (HOSPITAL_COMMUNITY)
Admission: AD | Admit: 2016-09-07 | Discharge: 2016-09-12 | DRG: 885 | Disposition: A | Discharge status: Other Acute Inpatient Hospital | Payer: Medicare HMO | Source: Intra-hospital | Attending: Psychiatry | Admitting: Psychiatry

## 2016-09-07 DIAGNOSIS — Z79899 Other long term (current) drug therapy: Secondary | ICD-10-CM

## 2016-09-07 DIAGNOSIS — F1010 Alcohol abuse, uncomplicated: Secondary | ICD-10-CM | POA: Diagnosis present

## 2016-09-07 DIAGNOSIS — I10 Essential (primary) hypertension: Secondary | ICD-10-CM | POA: Insufficient documentation

## 2016-09-07 DIAGNOSIS — F3181 Bipolar II disorder: Principal | ICD-10-CM | POA: Diagnosis present

## 2016-09-07 DIAGNOSIS — R569 Unspecified convulsions: Secondary | ICD-10-CM | POA: Diagnosis not present

## 2016-09-07 DIAGNOSIS — F17210 Nicotine dependence, cigarettes, uncomplicated: Secondary | ICD-10-CM | POA: Diagnosis present

## 2016-09-07 DIAGNOSIS — G40909 Epilepsy, unspecified, not intractable, without status epilepticus: Secondary | ICD-10-CM | POA: Diagnosis present

## 2016-09-07 DIAGNOSIS — R45850 Homicidal ideations: Secondary | ICD-10-CM | POA: Diagnosis not present

## 2016-09-07 DIAGNOSIS — F411 Generalized anxiety disorder: Secondary | ICD-10-CM | POA: Diagnosis present

## 2016-09-07 DIAGNOSIS — F14251 Cocaine dependence with cocaine-induced psychotic disorder with hallucinations: Secondary | ICD-10-CM | POA: Diagnosis present

## 2016-09-07 DIAGNOSIS — G809 Cerebral palsy, unspecified: Secondary | ICD-10-CM | POA: Diagnosis present

## 2016-09-07 DIAGNOSIS — R443 Hallucinations, unspecified: Secondary | ICD-10-CM

## 2016-09-07 DIAGNOSIS — F1994 Other psychoactive substance use, unspecified with psychoactive substance-induced mood disorder: Secondary | ICD-10-CM | POA: Diagnosis not present

## 2016-09-07 DIAGNOSIS — Z7951 Long term (current) use of inhaled steroids: Secondary | ICD-10-CM | POA: Insufficient documentation

## 2016-09-07 DIAGNOSIS — R440 Auditory hallucinations: Secondary | ICD-10-CM | POA: Insufficient documentation

## 2016-09-07 DIAGNOSIS — R45851 Suicidal ideations: Secondary | ICD-10-CM | POA: Diagnosis not present

## 2016-09-07 DIAGNOSIS — F419 Anxiety disorder, unspecified: Secondary | ICD-10-CM | POA: Insufficient documentation

## 2016-09-07 LAB — RAPID URINE DRUG SCREEN, HOSP PERFORMED
Amphetamines: NOT DETECTED
BARBITURATES: NOT DETECTED
BENZODIAZEPINES: NOT DETECTED
Cocaine: POSITIVE — AB
Opiates: NOT DETECTED
Tetrahydrocannabinol: POSITIVE — AB

## 2016-09-07 LAB — CBC
HEMATOCRIT: 43 % (ref 39.0–52.0)
Hemoglobin: 14.6 g/dL (ref 13.0–17.0)
MCH: 30 pg (ref 26.0–34.0)
MCHC: 34 g/dL (ref 30.0–36.0)
MCV: 88.3 fL (ref 78.0–100.0)
PLATELETS: 193 10*3/uL (ref 150–400)
RBC: 4.87 MIL/uL (ref 4.22–5.81)
RDW: 14.9 % (ref 11.5–15.5)
WBC: 5 10*3/uL (ref 4.0–10.5)

## 2016-09-07 LAB — COMPREHENSIVE METABOLIC PANEL
ALBUMIN: 4.1 g/dL (ref 3.5–5.0)
ALK PHOS: 89 U/L (ref 38–126)
ALT: 18 U/L (ref 17–63)
AST: 27 U/L (ref 15–41)
Anion gap: 13 (ref 5–15)
BILIRUBIN TOTAL: 1.3 mg/dL — AB (ref 0.3–1.2)
BUN: 14 mg/dL (ref 6–20)
CALCIUM: 9.5 mg/dL (ref 8.9–10.3)
CO2: 25 mmol/L (ref 22–32)
CREATININE: 1.2 mg/dL (ref 0.61–1.24)
Chloride: 102 mmol/L (ref 101–111)
GFR calc Af Amer: >60 mL/min (ref >60)
GLUCOSE: 91 mg/dL (ref 65–99)
Potassium: 4 mmol/L (ref 3.5–5.1)
Sodium: 140 mmol/L (ref 135–145)
TOTAL PROTEIN: 7.1 g/dL (ref 6.5–8.1)

## 2016-09-07 LAB — VALPROIC ACID LEVEL

## 2016-09-07 LAB — SALICYLATE LEVEL: Salicylate Lvl: <7 mg/dL (ref 2.8–30.0)

## 2016-09-07 LAB — ACETAMINOPHEN LEVEL: Acetaminophen (Tylenol), Serum: <10 ug/mL — ABNORMAL LOW (ref 10–30)

## 2016-09-07 LAB — ETHANOL

## 2016-09-07 MED ORDER — LORAZEPAM 1 MG PO TABS: 1.0000 mg | ORAL_TABLET | Freq: Three times a day (TID) | ORAL | Status: DC

## 2016-09-07 MED ORDER — THIAMINE HCL 100 MG/ML IJ SOLN: 100.0000 mg | Freq: Every day | INTRAMUSCULAR | Status: DC

## 2016-09-07 MED ORDER — TRAZODONE HCL 50 MG PO TABS
50.0000 mg | ORAL_TABLET | Freq: Every evening | ORAL | Status: DC | PRN
Start: 2016-09-07 — End: 2016-09-12
  Administered 2016-09-07 – 2016-09-11 (×5): 50 mg via ORAL
  Filled 2016-09-07: qty 1
  Filled 2016-09-07: qty 14
  Filled 2016-09-07 (×3): qty 1

## 2016-09-07 MED ORDER — LORAZEPAM 1 MG PO TABS: 1.0000 mg | ORAL_TABLET | Freq: Two times a day (BID) | ORAL | Status: DC

## 2016-09-07 MED ORDER — ALUM & MAG HYDROXIDE-SIMETH 200-200-20 MG/5ML PO SUSP: 30.0000 mL | ORAL | Status: DC | PRN

## 2016-09-07 MED ORDER — IBUPROFEN 600 MG PO TABS: 600.0000 mg | ORAL_TABLET | Freq: Three times a day (TID) | ORAL | Status: DC | PRN

## 2016-09-07 MED ORDER — FLUOXETINE HCL 20 MG PO CAPS
20.0000 mg | ORAL_CAPSULE | Freq: Every day | ORAL | Status: DC
  Administered 2016-09-07: 20 mg via ORAL
  Filled 2016-09-07: qty 1

## 2016-09-07 MED ORDER — TRAZODONE HCL 50 MG PO TABS
50.0000 mg | ORAL_TABLET | Freq: Every evening | ORAL | Status: DC | PRN
  Administered 2016-09-07: 50 mg via ORAL
  Filled 2016-09-07 (×3): qty 1

## 2016-09-07 MED ORDER — VITAMIN B-1 100 MG PO TABS
100.0000 mg | ORAL_TABLET | Freq: Every day | ORAL | Status: DC
  Administered 2016-09-07: 100 mg via ORAL
  Filled 2016-09-07: qty 1

## 2016-09-07 MED ORDER — THIAMINE HCL 100 MG/ML IJ SOLN: 100.0000 mg | Freq: Once | INTRAMUSCULAR | Status: DC

## 2016-09-07 MED ORDER — IBUPROFEN 400 MG PO TABS: 600.0000 mg | ORAL_TABLET | Freq: Three times a day (TID) | ORAL | Status: DC | PRN

## 2016-09-07 MED ORDER — NICOTINE 21 MG/24HR TD PT24
21.0000 mg | MEDICATED_PATCH | Freq: Every day | TRANSDERMAL | Status: DC
  Administered 2016-09-08 – 2016-09-12 (×5): 21 mg via TRANSDERMAL
  Filled 2016-09-07: qty 14
  Filled 2016-09-07 (×4): qty 1
  Filled 2016-09-07: qty 14
  Filled 2016-09-07 (×2): qty 1

## 2016-09-07 MED ORDER — DIVALPROEX SODIUM 250 MG PO DR TAB
750.0000 mg | DELAYED_RELEASE_TABLET | Freq: Once | ORAL | Status: AC
  Administered 2016-09-07: 750 mg via ORAL
  Filled 2016-09-07: qty 3

## 2016-09-07 MED ORDER — NICOTINE 21 MG/24HR TD PT24
21.0000 mg | MEDICATED_PATCH | Freq: Every day | TRANSDERMAL | Status: DC
  Administered 2016-09-07: 21 mg via TRANSDERMAL
  Filled 2016-09-07: qty 1

## 2016-09-07 MED ORDER — ALUM & MAG HYDROXIDE-SIMETH 200-200-20 MG/5ML PO SUSP: 30.0000 mL | Freq: Four times a day (QID) | ORAL | Status: DC | PRN

## 2016-09-07 MED ORDER — ONDANSETRON 4 MG PO TBDP: 4.0000 mg | ORAL_TABLET | Freq: Four times a day (QID) | ORAL | Status: AC | PRN

## 2016-09-07 MED ORDER — LORAZEPAM 2 MG/ML IJ SOLN: 0.0000 mg | Freq: Four times a day (QID) | INTRAMUSCULAR | Status: DC

## 2016-09-07 MED ORDER — HYDROXYZINE HCL 25 MG PO TABS: 25.0000 mg | ORAL_TABLET | Freq: Four times a day (QID) | ORAL | Status: DC | PRN

## 2016-09-07 MED ORDER — HYDROXYZINE HCL 25 MG PO TABS
25.0000 mg | ORAL_TABLET | Freq: Four times a day (QID) | ORAL | Status: DC | PRN
  Administered 2016-09-07 – 2016-09-11 (×2): 25 mg via ORAL
  Filled 2016-09-07 (×2): qty 1
  Filled 2016-09-07: qty 20

## 2016-09-07 MED ORDER — LORAZEPAM 1 MG PO TABS: 0.0000 mg | ORAL_TABLET | Freq: Four times a day (QID) | ORAL | Status: DC

## 2016-09-07 MED ORDER — LORAZEPAM 2 MG/ML IJ SOLN: 0.0000 mg | Freq: Two times a day (BID) | INTRAMUSCULAR | Status: DC

## 2016-09-07 MED ORDER — LORAZEPAM 1 MG PO TABS: 1.0000 mg | ORAL_TABLET | Freq: Every day | ORAL | Status: DC

## 2016-09-07 MED ORDER — MAGNESIUM HYDROXIDE 400 MG/5ML PO SUSP: 30.0000 mL | Freq: Every day | ORAL | Status: DC | PRN

## 2016-09-07 MED ORDER — VITAMIN B-1 100 MG PO TABS
100.0000 mg | ORAL_TABLET | Freq: Every day | ORAL | Status: DC
  Administered 2016-09-08 – 2016-09-09 (×2): 100 mg via ORAL
  Filled 2016-09-07 (×3): qty 1

## 2016-09-07 MED ORDER — ACETAMINOPHEN 325 MG PO TABS: 650.0000 mg | ORAL_TABLET | Freq: Four times a day (QID) | ORAL | Status: DC | PRN

## 2016-09-07 MED ORDER — DIVALPROEX SODIUM ER 500 MG PO TB24
750.0000 mg | ORAL_TABLET | Freq: Every day | ORAL | Status: DC
  Administered 2016-09-07 – 2016-09-11 (×5): 750 mg via ORAL
  Filled 2016-09-07 (×8): qty 1

## 2016-09-07 MED ORDER — ALBUTEROL SULFATE HFA 108 (90 BASE) MCG/ACT IN AERS: 2.0000 | INHALATION_SPRAY | RESPIRATORY_TRACT | Status: DC

## 2016-09-07 MED ORDER — RISPERIDONE 0.5 MG PO TABS
0.5000 mg | ORAL_TABLET | ORAL | Status: DC
  Administered 2016-09-08: 0.5 mg via ORAL
  Filled 2016-09-07 (×4): qty 1

## 2016-09-07 MED ORDER — TRAZODONE HCL 50 MG PO TABS: 50.0000 mg | ORAL_TABLET | Freq: Every evening | ORAL | Status: DC | PRN

## 2016-09-07 MED ORDER — GABAPENTIN 100 MG PO CAPS
200.0000 mg | ORAL_CAPSULE | Freq: Two times a day (BID) | ORAL | Status: DC
  Administered 2016-09-08 – 2016-09-12 (×9): 200 mg via ORAL
  Filled 2016-09-07: qty 2
  Filled 2016-09-07 (×2): qty 56
  Filled 2016-09-07 (×6): qty 2
  Filled 2016-09-07: qty 56
  Filled 2016-09-07 (×4): qty 2
  Filled 2016-09-07: qty 56

## 2016-09-07 MED ORDER — ACETAMINOPHEN 325 MG PO TABS: 650.0000 mg | ORAL_TABLET | ORAL | Status: DC | PRN

## 2016-09-07 MED ORDER — HYDROCHLOROTHIAZIDE 25 MG PO TABS
25.0000 mg | ORAL_TABLET | Freq: Once | ORAL | Status: AC
  Administered 2016-09-07: 25 mg via ORAL
  Filled 2016-09-07: qty 1

## 2016-09-07 MED ORDER — LORAZEPAM 1 MG PO TABS
1.0000 mg | ORAL_TABLET | Freq: Four times a day (QID) | ORAL | Status: DC
  Administered 2016-09-07 – 2016-09-08 (×2): 1 mg via ORAL
  Filled 2016-09-07 (×3): qty 1

## 2016-09-07 MED ORDER — GABAPENTIN 100 MG PO CAPS
200.0000 mg | ORAL_CAPSULE | Freq: Two times a day (BID) | ORAL | Status: DC
  Filled 2016-09-07: qty 2

## 2016-09-07 MED ORDER — ONDANSETRON HCL 4 MG PO TABS: 4.0000 mg | ORAL_TABLET | Freq: Three times a day (TID) | ORAL | Status: DC | PRN

## 2016-09-07 MED ORDER — DIVALPROEX SODIUM ER 500 MG PO TB24
750.0000 mg | ORAL_TABLET | Freq: Every day | ORAL | Status: DC
  Filled 2016-09-07: qty 1

## 2016-09-07 MED ORDER — LORAZEPAM 1 MG PO TABS: 0.0000 mg | ORAL_TABLET | Freq: Two times a day (BID) | ORAL | Status: DC

## 2016-09-07 MED ORDER — FLUOXETINE HCL 20 MG PO CAPS
20.0000 mg | ORAL_CAPSULE | Freq: Every day | ORAL | Status: DC
  Administered 2016-09-08 – 2016-09-12 (×5): 20 mg via ORAL
  Filled 2016-09-07: qty 14
  Filled 2016-09-07 (×5): qty 1
  Filled 2016-09-07: qty 14
  Filled 2016-09-07: qty 1

## 2016-09-07 MED ORDER — RISPERIDONE 0.5 MG PO TABS
0.5000 mg | ORAL_TABLET | ORAL | Status: DC
  Administered 2016-09-07: 0.5 mg via ORAL
  Filled 2016-09-07: qty 1

## 2016-09-07 MED ORDER — LOPERAMIDE HCL 2 MG PO CAPS: 2.0000 mg | ORAL_CAPSULE | ORAL | Status: AC | PRN

## 2016-09-07 MED ORDER — HYDROCHLOROTHIAZIDE 25 MG PO TABS: 25.0000 mg | ORAL_TABLET | Freq: Every day | ORAL | Status: DC

## 2016-09-07 MED ORDER — ADULT MULTIVITAMIN W/MINERALS CH
1.0000 | ORAL_TABLET | Freq: Every day | ORAL | Status: DC
  Administered 2016-09-08 – 2016-09-12 (×5): 1 via ORAL
  Filled 2016-09-07 (×7): qty 1

## 2016-09-07 MED ORDER — LORAZEPAM 1 MG PO TABS: 1.0000 mg | ORAL_TABLET | Freq: Four times a day (QID) | ORAL | Status: DC | PRN

## 2016-09-07 NOTE — BH Assessment (Addendum)
Tele Assessment Note   Brian Lucero is an 46 y.o. male who presents to the ED voluntarily. Pt reports he has been experiencing increased SI with a plan to walk in front of a car. Pt reports he has attempted suicide in the past by way of OD and stated "but that hasn't worked so I guess I need to do something drastic." Pt reports he had recent medication changes which he believes may be attributing to his increased SI. Pt endorses AVH that tell him he is "worthless, no good, and should just kill himself." Pt reports that he sees the words "kill, kill, kill" when he closes his eyes. Pt stated "I just need help. I just need to know if you guys will help me. I been drinking a lot and that's really why I need help." Pt reports he drinks up to four 40oz beers a day. Pt also endorses insomnia and states he only sleeps for 2-3 hours each night. Pt denies any hx of violence towards others but stated "I yell a lot. I get irritated easily. Like when people ask me 100 questions over and over."  Case discussed with Marquette Old., NP who recommends inpt treatment. Spoke with Hewitt Shorts, RN to advise of recommendation.   Diagnosis: Alcohol Use D/O; MDD w/ psychotic features    Past Medical History:  Past Medical History:  Diagnosis Date  . Anxiety   . Bipolar 2 disorder (HCC)   . Depression   . Hypertension   . Pneumothorax    as a baby in the 42's  . Seizures (HCC)     History reviewed. No pertinent surgical history.  Family History:  Family History  Problem Relation Age of Onset  . Diabetes type II Mother   . Diabetes type II Unknown     Social History:  reports that he has been smoking Cigarettes.  He has been smoking about 0.20 packs per day. He has never used smokeless tobacco. He reports that he drinks about 1.2 oz of alcohol per week . He reports that he uses drugs, including Cocaine.  Additional Social History:  Alcohol / Drug Use Pain Medications: See MAR Prescriptions: See MAR Over the  Counter: See MAR History of alcohol / drug use?: Yes Substance #1 Name of Substance 1: Cocaine 1 - Age of First Use: 20s 1 - Amount (size/oz): unknown 1 - Frequency: pt reports "not often" 1 - Duration: ongoing 1 - Last Use / Amount: 09/05/16 Substance #2 Name of Substance 2: Alcohol 2 - Age of First Use: 17 2 - Amount (size/oz): 4 40oz beers 2 - Frequency: daily 2 - Duration: ongoing 2 - Last Use / Amount: 09/06/16 Substance #3 Name of Substance 3: Marijuana 3 - Age of First Use: 10 3 - Amount (size/oz): unknown 3 - Frequency: pt reports "not often" 3 - Duration: ongoing 3 - Last Use / Amount: unknown, pt stated "I may have yesterday. I don't know. I'm trying to get rid of some feelings." Pt labs positive for marijuana on arrival to ED  CIWA: CIWA-Ar BP: (!) 178/102 Pulse Rate: 70 COWS:    PATIENT STRENGTHS: (choose at least two) Capable of independent living Metallurgist fund of knowledge Motivation for treatment/growth  Allergies:  Allergies  Allergen Reactions  . Levofloxacin Swelling  . Lisinopril Swelling    Home Medications:  (Not in a hospital admission)  OB/GYN Status:  No LMP for male patient.  General Assessment Data [facility] Assessment: In system Is this a Tele or Face-to-Face Assessment?: Tele Assessment Is this an Initial Assessment or a Re-assessment for this encounter?: Initial Assessment Marital status: Separated Is patient pregnant?: No Pregnancy Status: No Living Arrangements: Non-relatives/Friends Can pt return to current living arrangement?: Yes Admission Status: Voluntary Is patient capable of signing voluntary admission?: Yes Referral Source: Self/Family/Friend Insurance type: Medicare     Crisis Care Plan Living Arrangements: Non-relatives/Friends [facility]  Name of Therapist: none  Education Status Is patient currently in school?:  No Highest grade of school patient has completed: GED  Risk to self with the past 6 months Suicidal Ideation: Yes-Currently Present Has patient been a risk to self within the past 6 months prior to admission? : Yes Suicidal Intent: Yes-Currently Present Has patient had any suicidal intent within the past 6 months prior to admission? : Yes Is patient at risk for suicide?: Yes Suicidal Plan?: Yes-Currently Present Has patient had any suicidal plan within the past 6 months prior to admission? : Yes Specify Current Suicidal Plan: pt reports a plan to walk into traffic  Access to Means: Yes Specify Access to Suicidal Means: pt has access to traffic  What has been your use of drugs/alcohol within the last 12 months?: reports to daily alcohol use and occasional marijuana and cocaine use  Previous Attempts/Gestures: Yes How many times?: 3 Triggers for Past Attempts: Unpredictable, Hallucinations Intentional Self Injurious Behavior: None Family Suicide History: No Recent stressful life event(s): Other (Comment) (reports recent medication changes) Persecutory voices/beliefs?: No Depression: Yes Depression Symptoms: Insomnia, Feeling angry/irritable Substance abuse history and/or treatment for substance abuse?: Yes Suicide prevention information given to non-admitted patients: Not applicable  Risk to Others within the past 6 months Homicidal Ideation: No Does patient have any lifetime risk of violence toward others beyond the six months prior to admission? : No Thoughts of Harm to Others: No Current Homicidal Intent: No Current Homicidal Plan: No Access to Homicidal Means: No History of harm to others?: No Assessment of Violence: None Noted Does patient have access to weapons?: No Criminal Charges Pending?: No Does patient have a court date: No Is patient on probation?: No  Psychosis Hallucinations: Auditory, Visual, With command Delusions: None noted  Mental Status  Report Appearance/Hygiene: In scrubs Eye Contact: Fair Motor Activity: Freedom of movement Speech: Logical/coherent Level of Consciousness: Alert Mood: Helpless, Depressed Affect: Irritable Anxiety Level: Minimal Thought Processes: Coherent, Relevant Judgement: Impaired Orientation: Person, Place, Time, Situation, Appropriate for developmental age Obsessive Compulsive Thoughts/Behaviors: None  Cognitive Functioning Concentration: Normal Memory: Remote Intact, Recent Intact IQ: Average Insight: Fair Impulse Control: Fair Appetite: Poor Sleep: Decreased Total Hours of Sleep: 3 Vegetative Symptoms: None  ADLScreening [facility]) Patient's cognitive ability adequate to safely complete daily activities?: Yes Patient able to express need for assistance with ADLs?: Yes Independently performs ADLs?: Yes (appropriate for developmental age)  Prior Inpatient Therapy Prior Inpatient Therapy: Yes Prior Therapy Dates: 2018, 2017, and others [facility] Reason for Treatment: SA, Psychosis, SI  Prior Outpatient Therapy Prior Outpatient Therapy: No Does patient have an ACCT team?: No Does patient have Intensive In-House Services?  : No Does patient have [facility] services? : No Does patient have P4CC services?: No  ADL Screening (condition at time of admission) Patient's cognitive ability adequate to safely complete daily activities?: Yes Is the patient deaf or have difficulty hearing?: No Does the patient have difficulty seeing, even when wearing glasses/contacts?: No  Does the patient have difficulty concentrating, remembering, or making decisions?: No Patient able to express need for assistance with ADLs?: Yes Does the patient have difficulty dressing or bathing?: No Independently performs ADLs?: Yes (appropriate for developmental age) Does the patient have difficulty walking or climbing stairs?: No Weakness of Legs: None Weakness of  Arms/Hands: None  Home Assistive Devices/Equipment Home Assistive Devices/Equipment: None    Abuse/Neglect Assessment (Assessment to be complete while patient is alone) Physical Abuse: Denies Verbal Abuse: Denies Sexual Abuse: Denies Exploitation of patient/patient's resources: Denies Self-Neglect: Denies     Merchant navy officer (For Healthcare) Does Patient Have a Medical Advance Directive?: No Would patient like information on creating a medical advance directive?: No - Patient declined    Additional Information 1:1 In Past 12 Months?: No CIRT Risk: No Elopement Risk: No Does patient have medical clearance?: Yes     Disposition:  Disposition Initial Assessment Completed for this Encounter: Yes Disposition of Patient: Inpatient treatment program Type of inpatient treatment program: Adult (per Marquette Old., NP )  Karolee Ohs 09/07/2016 2:55 PM

## 2016-09-07 NOTE — ED Triage Notes (Signed)
Pt. Stated, I've been feeling suicidal and having hallucinations for a week. Brian Atlasve been trying to deal with it, but unable to get a grip.

## 2016-09-07 NOTE — ED Notes (Signed)
Sitter called. Pt wanded from security

## 2016-09-07 NOTE — ED Notes (Signed)
Pt admits to drinking at least 4- 40 ounce beers a day-- and using "any kind of cocaine" except injectable.  Reviewed Pod F expectations, signature obtained, copy given to pt.

## 2016-09-07 NOTE — Tx Team (Signed)
Initial Treatment Plan 09/07/2016 10:03 PM Brian Lucero WUJ:[id number]    PATIENT STRESSORS: Financial difficulties Marital or family conflict Medication change or noncompliance Occupational concerns Substance abuse   PATIENT STRENGTHS: Average or above average intelligence Communication skills Motivation for treatment/growth Supportive family/friends   PATIENT IDENTIFIED PROBLEMS: Substance Abuse  Suicidal  "Stay alive"  "Get to a long term inpatient treatment center"               DISCHARGE CRITERIA:  Ability to meet basic life and health needs Improved stabilization in mood, thinking, and/or behavior Motivation to continue treatment in a less acute level of care Need for constant or close observation no longer present Verbal commitment to aftercare and medication compliance Withdrawal symptoms are absent or subacute and managed without 24-hour nursing intervention  PRELIMINARY DISCHARGE PLAN: Attend 12-step recovery group Outpatient therapy Return to previous living arrangement  PATIENT/FAMILY INVOLVEMENT: This treatment plan has been presented to and reviewed with the patient, Brian Lucero.  The patient and family have been given the opportunity to ask questions and make suggestions.  Carleene OverlieMiddleton, Jalaina Salyers P, RN 09/07/2016, 10:03 PM

## 2016-09-07 NOTE — ED Notes (Signed)
[facility]

## 2016-09-07 NOTE — Progress Notes (Signed)
[facility].  Accepting provider: Elta GuadeloupeLaurie Parks, NP;  Attending Provider: Dr. Jama Flavorsobos; Nurse to Nurse report # [phone]  Arrival scheduled for 20:30  [facility] Nurse, Judeth CornfieldStephanie, RN, notified per [facility], Richelle ItoLinsey, RN.  Timmothy EulerJean T. Kaylyn LimSutter, MSW, LCSWA Clinical Social Work Disposition [phone]

## 2016-09-07 NOTE — ED Provider Notes (Signed)
[facility]-EMERGENCY DEPT Provider Note   CSN: [id number] Arrival date & time: 09/07/16  4098     History   Chief Complaint Chief Complaint  Patient presents with  . Suicidal  . Hallucinations    HPI   Blood pressure (!) 158/133, pulse 96, temperature 98.2 F (36.8 C), temperature source Oral, resp. rate 14, height 5' 5.25" (1.657 m), weight 52.2 kg (115 lb), SpO2 98 %.  Brian Lucero is a 46 y.o. male  with PMHx of MDD, Bipolar II d/o, Seizures on Depakote, Substance induced psychotic d/o, who presents to the ED today voluntarily c/o persistent SI/HI and audio-visual hallucinations for the past week and a half. He has been thinking about killing himself again, by standing in front of cars or buses. He has not yet attempted this, but has made suicide attempts in the past with pill ingestion. He has made x3 separate attempts to kill himself with overdose in the past, once by taking all of his father's Nitroglycerin tablets. For the past week and a half, he has also been having thoughts of wanting to kill others, although no one in particular. Patient has been seeing the word "KILL" in front of him intermittently, and has been hearing a voice telling him to hurt himself, and that he should "just end it". Patient states that he wants help. He has history of similar audio/visual hallucinations in the past, and was last admitted to [facility] in March 2018 for similar complaints. Patient does not have access to guns or weapons. Last alcohol use yesterday, and last cocaine use yesterday morning. Patient last took his medications x2 days ago. He lives in an apartment with a roommate who her reports he gets along with well. Denies HA, CP, SOB, abdominal pain, numbness/tingling.   Past Medical History:  Diagnosis Date  . Anxiety   . Bipolar 2 disorder (HCC)   . Depression   . Hypertension   . Pneumothorax    as a baby in the 86's  . Seizures [facility])     Patient Active Problem List   Diagnosis Date Noted  . 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 Major depressive disorder, recurrent episode, severe, with psychosis (HCC) 06/12/2016  . Cocaine use disorder, severe, dependence (HCC) 06/12/2016  . Major depressive disorder, recurrent severe without psychotic features (HCC) 06/12/2016  . Seizure (HCC) 11/17/2011  . Tobacco abuse 11/17/2011  . Alcohol abuse 11/17/2011  . Cocaine abuse 11/17/2011  . Bipolar 2 disorder (HCC) 11/17/2011  . Hypertension 11/17/2011  . Hydrocephalus 11/17/2011  . Aspiration pneumonia (HCC) 11/17/2011    History reviewed. No pertinent surgical history.     Home Medications    Prior to Admission medications   Medication Sig Start Date End Date Taking? Authorizing Provider  divalproex (DEPAKOTE ER) 250 MG 24 hr tablet Take 3 tablets (750 mg total) by mouth at bedtime. 06/25/16  Yes Oneta Rack, NP  FLUoxetine (PROZAC) 20 MG capsule Take 1 capsule (20 mg total) by mouth daily. For depression 06/21/16  Yes Nwoko,  Kindred I, NP  gabapentin (NEURONTIN) 100 MG capsule Take 2 capsules (200 mg total) by mouth 2 (two) times daily. For agitation 06/20/16  Yes Armandina Stammer I, NP  hydrochlorothiazide (HYDRODIURIL) 25 MG tablet Take 1 tablet (25 mg total) by mouth daily. For high blood pressure 06/21/16  Yes Nwoko, Agnes I, NP  risperiDONE (RISPERDAL) 0.5 MG tablet Take 1 tablet (0.5 mg total) by mouth every morning. 06/26/16  Yes Hillery Jacks  N, NP  traZODone (DESYREL) 50 MG tablet Take 1 tablet (50 mg) at bedtime: For sleep 06/20/16  Yes Nwoko,  Kindred I, NP  albuterol (PROVENTIL HFA;VENTOLIN HFA) 108 (90 Base) MCG/ACT inhaler Inhale 2 puffs into the lungs every 4 (four) hours. For shortness of breath 06/20/16   Armandina Stammer I, NP  amLODipine (NORVASC) 10 MG tablet Take 1 tablet (10 mg total) by mouth daily. Patient not taking: Reported on 09/07/2016 06/26/16   Oneta Rack, NP  diphenhydrAMINE (BENADRYL) 25 MG tablet Take  1 tablet (25 mg total) by mouth every 6 (six) hours as needed for itching. Patient not taking: Reported on 09/07/2016 06/23/16 06/28/16  Shaune Pollack, MD  guaiFENesin (MUCINEX) 600 MG 12 hr tablet Take 2 tablets (1,200 mg total) by mouth 2 (two) times daily as needed for cough or to loosen phlegm. Patient not taking: Reported on 09/07/2016 06/25/16   Oneta Rack, NP  hydrochlorothiazide (HYDRODIURIL) 25 MG tablet Take 1 tablet (25 mg total) by mouth daily. Patient not taking: Reported on 09/07/2016 07/16/13   Elwin Mocha, MD  lisinopril (PRINIVIL,ZESTRIL) 20 MG tablet Take 1 tablet (20 mg total) by mouth daily. For high blood pressure Patient not taking: Reported on 09/07/2016 06/21/16   Armandina Stammer I, NP  nicotine (NICODERM CQ - DOSED IN MG/24 HOURS) 21 mg/24hr patch Place 1 patch (21 mg total) onto the skin daily. For smoking cessation Patient not taking: Reported on 09/07/2016 06/21/16   Armandina Stammer I, NP  ranitidine (ZANTAC) 150 MG capsule Take 1 capsule (150 mg total) by mouth 2 (two) times daily. Patient not taking: Reported on 09/07/2016 06/23/16 06/28/16  Shaune Pollack, MD  risperiDONE (RISPERDAL) 1 MG tablet Take 1 tablet (1 mg total) by mouth at bedtime. Patient not taking: Reported on 09/07/2016 06/25/16   Oneta Rack, NP    Family History Family History  Problem Relation Age of Onset  . Diabetes type II Mother   . Diabetes type II Unknown     Social History Social History  Substance Use Topics  . Smoking status: Current Every Day Smoker    Packs/day: 0.20    Types: Cigarettes  . Smokeless tobacco: Never Used  . Alcohol use 1.2 oz/week    2 Cans of beer per week     Comment: 1 40oz Fri. and Sat.     Allergies   Levofloxacin and Lisinopril   Review of Systems Review of Systems  A complete review of systems was obtained and all systems are negative except as noted in the HPI and PMH.    Physical Exam Updated Vital Signs BP (!) 178/102 (BP Location: Right Arm)    Pulse 70   Temp 97.7 F (36.5 C) (Oral)   Resp 16   Ht 5' 5.25" (1.657 m)   Wt 52.2 kg (115 lb)   SpO2 98%   BMI 18.99 kg/m   Physical Exam  Constitutional: He is oriented to person, place, and time. He appears well-developed and well-nourished. No distress.  HENT:  Head: Normocephalic and atraumatic.  Mouth/Throat: Oropharynx is clear and moist.  Eyes: Conjunctivae and EOM are normal. Pupils are equal, round, and reactive to light.  Neck: Normal range of motion.  Cardiovascular: Normal rate, regular rhythm and intact distal pulses.   Pulmonary/Chest: Effort normal and breath sounds normal. No respiratory distress. He has no wheezes. He has no rales. He exhibits no tenderness.  Abdominal: Soft. He exhibits no distension and no mass. There is no tenderness.  There is no rebound and no guarding. No hernia.  Musculoskeletal: Normal range of motion.  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  Skin: Capillary refill takes less than 2 seconds. He is not diaphoretic.  Psychiatric: He has a normal mood and affect.  Nursing note and vitals reviewed.    ED Treatments / Results  Labs (all labs ordered are listed, but only abnormal results are displayed) Labs Reviewed  COMPREHENSIVE METABOLIC PANEL - Abnormal; Notable for the following:       Result Value   Total Bilirubin 1.3 (*)    All other components within normal limits  ACETAMINOPHEN LEVEL - Abnormal; Notable for the following:    Acetaminophen (Tylenol), Serum <10 (*)    All other components within normal limits  RAPID URINE DRUG SCREEN, HOSP PERFORMED - Abnormal; Notable for the following:    Cocaine POSITIVE (*)    Tetrahydrocannabinol POSITIVE (*)    All other components within normal limits  VALPROIC ACID LEVEL - Abnormal; Notable for the following:    Valproic Acid Lvl <10 (*)    All other components within normal limits  ETHANOL    SALICYLATE LEVEL  CBC    EKG  EKG Interpretation None       Radiology No results found.  Procedures Procedures (including critical care time)  Medications Ordered in ED Medications  albuterol (PROVENTIL HFA;VENTOLIN HFA) 108 (90 Base) MCG/ACT inhaler 2 puff (not administered)  divalproex (DEPAKOTE ER) 24 hr tablet 750 mg (not administered)  FLUoxetine (PROZAC) capsule 20 mg (not administered)  gabapentin (NEURONTIN) capsule 200 mg (not administered)  risperiDONE (RISPERDAL) tablet 0.5 mg (not administered)  traZODone (DESYREL) tablet 50 mg (not administered)  acetaminophen (TYLENOL) tablet 650 mg (not administered)  alum & mag hydroxide-simeth (MAALOX/MYLANTA) 200-200-20 MG/5ML suspension 30 mL (not administered)  ibuprofen (ADVIL,MOTRIN) tablet 600 mg (not administered)  nicotine (NICODERM CQ - dosed in mg/24 hours) patch 21 mg (not administered)  ondansetron (ZOFRAN) tablet 4 mg (not administered)  hydrochlorothiazide (HYDRODIURIL) tablet 25 mg (25 mg Oral Given 09/07/16 1100)  divalproex (DEPAKOTE) DR tablet 750 mg (750 mg Oral Given 09/07/16 1358)     Initial Impression / Assessment and Plan / ED Course  I have reviewed the triage vital signs and the nursing notes.  Pertinent labs & imaging results that were available during my care of the patient were reviewed by me and considered in my medical decision making (see chart for details).     Vitals:   09/07/16 0757 09/07/16 0759 09/07/16 0940  BP:  (!) 158/133 (!) 178/102  Pulse:  96 70  Resp:  14 16  Temp:  98.2 F (36.8 C) 97.7 F (36.5 C)  TempSrc:  Oral Oral  SpO2:  98% 98%  Weight: 52.2 kg (115 lb)    Height: 5' 5.25" (1.657 m)      Medications  albuterol (PROVENTIL HFA;VENTOLIN HFA) 108 (90 Base) MCG/ACT inhaler 2 puff (not administered)  divalproex (DEPAKOTE ER) 24 hr tablet 750 mg (not administered)  FLUoxetine (PROZAC) capsule 20 mg (not administered)  gabapentin (NEURONTIN) capsule 200 mg (not  administered)  risperiDONE (RISPERDAL) tablet 0.5 mg (not administered)  traZODone (DESYREL) tablet 50 mg (not administered)  acetaminophen (TYLENOL) tablet 650 mg (not administered)  alum & mag hydroxide-simeth (MAALOX/MYLANTA) 200-200-20 MG/5ML suspension 30 mL (not administered)  ibuprofen (ADVIL,MOTRIN) tablet 600 mg (not administered)  nicotine (NICODERM CQ - dosed  in mg/24 hours) patch 21 mg (not administered)  ondansetron (ZOFRAN) tablet 4 mg (not administered)  hydrochlorothiazide (HYDRODIURIL) tablet 25 mg (25 mg Oral Given 09/07/16 1100)  divalproex (DEPAKOTE) DR tablet 750 mg (750 mg Oral Given 09/07/16 1358)    Brian Lucero is 46 y.o. male presenting with Auditory and visual hallucinations and suicidal ideation with a plan to jump in front of traffic. He has prior suicide attempts in the past and he attempted overdose. The pressure is elevated but he hasn't had his medications in several days. Denies any chest pain other complaints at this time.  Depakote level is undetectable. Neurology consult from Dr. Amada Jupiter appreciated: He recommends that if the patient is not in status we do not need to load with IV Depakote. We have discussed his typical dose of 750 mg daily and he states that that is an atypical antiepileptic dose. He really initiating his regular oral dose, no IV load  Patient is medically cleared for psychiatric evaluation will be transferred to the psych ED. TTS consulted, home meds and psych standard holding orders placed.      Final Clinical Impressions(s) / ED Diagnoses   Final diagnoses:  Suicidal ideation  Hallucination    New Prescriptions New Prescriptions   No medications on file     Kaylyn Lim 09/07/16 1405    Melene Plan, DO 09/07/16 1542

## 2016-09-07 NOTE — Progress Notes (Signed)
Admission Note:  46 year old male who presents, in no acute distress, for the treatment of SI/HI and Substance Abuse. Patient appears agitated and irritable on admission. Patient was cooperative but appeared to become increasingly agitated as admission questions were asked. Patient continues to endorse SI/HI and contracts for safety upon admission. Patient was assessed for AVH and stated "don't you have my paperwork what does it say? If you already know then why you asked me the question?".  Patient appeared to be responding to internal stimuli during admission and was observed laughing at inappropriate times.  Patient reports that his doctor took him off some of his mental health medications last week.  Since last week, patient reports feeling increasingly suicidal, easily agitated, and poor sleep due to racing thoughts reporting sleeping '2-3 hours" per night.  Patient reports that he usually takes Trazodone 50mg  and states "50 don't work anymore".  Patient identifies multiple stressors to include "Me and my wife just split up and I lost my job".  Patient is currently on disability and was living in a treatment house prior to admission.  Patient identifies Gloris Manchesterraci, the house lady over the treatment house, and Thayer OhmChris, patient's counselor, as his support system.  Patient reports drinking "4 40's" per day and cocaine use.  Upon discharge, patient would like long term treatment or to return to the treatment house.  [facility] patient would like to "stay alive" and  "get to a longterm inpatient treatment center". Skin was assessed and found to be clear of any abnormal marks.  Patient searched and no contraband found, POC and unit policies explained and understanding verbalized. Consents obtained. Food and fluids offered and accepted. Patient had no additional questions or concerns.

## 2016-09-07 NOTE — ED Notes (Signed)
Retrieved belongings from security safe.

## 2016-09-07 NOTE — ED Notes (Signed)
[name]

## 2016-09-07 NOTE — ED Notes (Signed)
Called lab about Valproic acid lab not resulting. Was told another 30 because the machine would not dilute and they just got the other machine up and running.

## 2016-09-07 NOTE — ED Notes (Signed)
Lunch ordered 

## 2016-09-07 NOTE — ED Notes (Signed)
Pt relocated to Psych hold area. Report given to Clydie BraunKaren, [place]RN

## 2016-09-08 DIAGNOSIS — F17210 Nicotine dependence, cigarettes, uncomplicated: Secondary | ICD-10-CM

## 2016-09-08 DIAGNOSIS — F1994 Other psychoactive substance use, unspecified with psychoactive substance-induced mood disorder: Secondary | ICD-10-CM

## 2016-09-08 DIAGNOSIS — R45851 Suicidal ideations: Secondary | ICD-10-CM

## 2016-09-08 DIAGNOSIS — F3181 Bipolar II disorder: Principal | ICD-10-CM

## 2016-09-08 DIAGNOSIS — R45850 Homicidal ideations: Secondary | ICD-10-CM

## 2016-09-08 MED ORDER — RISPERIDONE 0.5 MG PO TABS
0.5000 mg | ORAL_TABLET | Freq: Two times a day (BID) | ORAL | Status: DC
  Administered 2016-09-08 – 2016-09-12 (×8): 0.5 mg via ORAL
  Filled 2016-09-08: qty 28
  Filled 2016-09-08 (×6): qty 1
  Filled 2016-09-08: qty 28
  Filled 2016-09-08: qty 1
  Filled 2016-09-08 (×2): qty 28
  Filled 2016-09-08 (×3): qty 1

## 2016-09-08 NOTE — [facility] Counselor (Signed)
Adult Comprehensive Assessment  Patient ID: Brian GaribaldiRandall Lucero, male   DOB: 09/12/1970, 46 y.o.   MRN: [id number]  Information Source: Information source: Patient  Current Stressors:  Educational / Learning stressors: GED Employment / Job issues: on disability since the 7190's Family Relationships: I have a wife; "that's itDevelopment worker, community."  Financial / Lack of resources (include bankruptcy): disability and income from wife Housing / Lack of housing: [place]. "[place]. We are seperated."  Physical health (include injuries &life threatening diseases): none identified. 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  Social relationships: poor. pt reports that he is isolated Substance abuse: alcohol "few 40's a day." crack cocaine every few days. History THC abuse.  Bereavement / Loss: none identified.   Living/Environment/Situation:  Living Arrangements: Other relatives/homeless at times.  Living conditions (as described by patient or guardian): grandparents How long has patient lived in current situation?: few years  What is atmosphere in current home: Comfortable  Family History:  Marital status: Separated Separated, when?: few years What types of issues is patient dealing with in the relationship?: "we just don't live together but we are together."  Additional relationship information: n/a  Are you sexually active?: Yes What is your sexual orientation?: heterosexual  Has your sexual activity been affected by drugs, alcohol, medication, or emotional stress?: n/a Does patient have children?: No  Childhood History:  By whom was/is the patient raised?: Grandparents Additional childhood history information: "my grandparents raised me.  Description of patient's relationship with caregiver when they were a child: close to grandparents/pt did not want to disclose what happened with his parents.  Patient's description of current relationship with people who raised  him/her: grandparents deceased How were you disciplined when you got in trouble as a child/adolescent?: n/a  Does patient have siblings?: Yes Number of Siblings: 1 Description of patient's current relationship with siblings: "I don't ever see my brother."  Did patient suffer any verbal/emotional/physical/sexual abuse as a child?: No Did patient suffer from severe childhood neglect?: No Has patient ever been sexually abused/assaulted/raped as an adolescent or adult?: No Was the patient ever a victim of a crime or a disaster?: No Witnessed domestic violence?: No Has patient been effected by domestic violence as an adult?: No  Education:  Highest grade of school patient has completed: GED Currently a student?: No Name of school: n/a  Learning disability?: No  Employment/Work Situation:  Employment situation: On disability Why is patient on disability: mental health; hand/arm deformity How long has patient been on disability: over 20 years Patient's job has been impacted by current illness: No What is the longest time patient has a held a job?: never held a job Where was the patient employed at that time?: never held a job.  Has patient ever been in the [employer] and Companymilitary?: No Has patient ever served in combat?: No Did You Receive Any Psychiatric Treatment/Services While in the [employer]Military?: No Are There Guns or Other Weapons in Your Home?: No Are These Weapons Safely Secured?: (n/a)  Financial Resources:  Financial resources: Insurance claims handlereceives SSDI, Medicare, Medicaid Does patient have a representative payee or guardian?: No  Alcohol/Substance Abuse:  What has been your use of drugs/alcohol within the last 12 months?: marijuana daily; crack cocaine every few days; alcohol 2-3 40oz beers daily. increased use over past several months--due to isolation, boredom, SI thoughts/ AH "I'm trying to get rid of the voices."  If attempted suicide, did drugs/alcohol play a role in this?: No (pt  reports  increased SI thoughts recently. ) Alcohol/Substance Abuse Treatment Hx: Denies past history If yes, describe treatment: n/a  Has alcohol/substance abuse ever caused legal problems?: No  Social Support System: Forensic psychologist System: Poor Describe Community Support System: no network of friends or family support; "my wife lives in [place]."  Type of faith/religion: christian How does patient's faith help to cope with current illness?: n/a   Leisure/Recreation:  Leisure and Hobbies: "nothing."   Strengths/Needs:  What things does the patient do well?: pt declined to answer In what areas does patient struggle / problems for patient: pt declined to answer  Discharge Plan:  Does patient have access to transportation?: Yes (bus) Will patient be returning to same living situation after discharge?: No (possibly inpatient treatment).  Plan for living situation after discharge: unsure at this time. Pt is not able to participate in aftercare planning due to extreme drowsiness/some irritability. CSW assessing.  Currently receiving community mental health services: No If no, would patient like referral for services when discharged?: Yes (What county?) Medical sales representative) Does patient have financial barriers related to discharge medications?: No   Summary/Recommendations:   Summary and Recommendations (to be completed by the evaluator): Patient is 46 yo [place] He presents to the hospital seeking treatment for SI with a plan, poor sleep, AVH, ETOH abuse, and for medication stabilization. Patient reports increased symptoms after recent medication change. Patient has a diagnosis of Bipolar Disorder with pyschosis and Alcohol Use Disorder. He also reports some cocaine and THC abuse. Recommendations for patient include: crisis stabilization, therapeutic milieu, encourage group attendance and participation, medication management for detox/mood  stabilization, and development of comprehenisve mental wellness/sobriety plan. CSW assessing for appropriate referrals.    Kyi Romanello [employer]. 09/08/2016 3:20 PM

## 2016-09-08 NOTE — Progress Notes (Signed)
Recreation Therapy Notes  Date: 09/08/16 Time: 0930 Location: 300 Hall Dayroom  Group Topic: Stress Management  Goal Area(s) Addresses:  Patient will verbalize importance of using healthy stress management.  Patient will identify positive emotions associated with healthy stress management.   Intervention: Stress Management  Activity :  Meditation.  LRT introduced the stress management technique of meditation.  LRT played a meditation from the calm app to allow patients to become centered and clear their minds and thoughts.  Patients were to follow along as the meditation played to fully engage in the activity.  Education:  Stress Management, Discharge Planning.   Education Outcome: Acknowledges edcuation/In group clarification offered/Needs additional education  Clinical Observations/Feedback: Pt did not attend group.   Nailah Luepke, LRT/CTRS         Mihaela Fajardo A 09/08/2016 12:20 PM 

## 2016-09-08 NOTE — H&P (Signed)
Psychiatric Admission Assessment Adult  Patient Identification: Brian Lucero MRN:  [id number] Date of Evaluation:  09/08/2016 Chief Complaint:  BIPOLAR DISORDER, SUBATANCE DISORDER MDD Principal Diagnosis: Substance Induced Psychotic Disorder Diagnosis:   Patient Active Problem List   Diagnosis Date Noted  . Bipolar 2 disorder, major depressive episode ([place]) [F31.81] 09/07/2016  . Substance-induced psychotic disorder with hallucinations ([place]) [F19.951] 06/14/2016  . Major depressive disorder, recurrent episode, severe, with psychosis ([place]) [F33.3] 06/12/2016  . Cocaine use disorder, severe, dependence ([place]) [F14.20] 06/12/2016  . Major depressive disorder, recurrent severe without psychotic features ([place]) [F33.2] 06/12/2016  . Seizure ([place]) [R56.9] 11/17/2011  . Tobacco abuse [Z72.0] 11/17/2011  . Alcohol abuse [F10.10] 11/17/2011  . Cocaine abuse [F14.10] 11/17/2011  . Bipolar 2 disorder ([place]) [F31.81] 11/17/2011  . Hypertension [I10] 11/17/2011  . Hydrocephalus [G91.9] 11/17/2011  . Aspiration pneumonia ([place]) [J69.0] 11/17/2011   History of Present Illness:  46 yo AAM. 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 Self presented to the ER reports thoughts of violence. Reports auditory hallucination that are derogatory. He has been having thoughts of walking into th traffic. Reports visualization of the word "kill" whenever he closes his eyes. Intoxicated with cocaine and THC. Patient is sleeping in his room. Refused to engaged. Sedated from medications. He is getting benzodiazepines for alcohol withdrawals. BAL was negative and he has not come in with elevated BAL in past hospital visits.    Associated Signs/Symptoms: Depression Symptoms:  Unable to assess at this time.  (Hypo) Manic Symptoms:  Unable to assess at this time.  Anxiety Symptoms:  Unable to assess at this time.  Psychotic Symptoms:  As above  PTSD Symptoms: Unable to assess at this time.  Total Time spent with  patient: 30 minutes  Past Psychiatric History: Repeated admission here. Similar presentation during last admission. Please refer to old notes for full details.   Is the patient at risk to self? Yes.    Has the patient been a risk to self in the past 6 months? Yes.    Has the patient been a risk to self within the distant past? Yes.    Is the patient a risk to others? Yes.    Has the patient been a risk to others in the past 6 months? Yes.    Has the patient been a risk to others within the distant past? Yes.     Prior Inpatient Therapy:   Prior Outpatient Therapy:    Alcohol Screening: 1. How often do you have a drink containing alcohol?: 4 or more times a week 2. How many drinks containing alcohol do you have on a typical day when you are drinking?: 10 or more 3. How often do you have six or more drinks on one occasion?: Daily or almost daily Preliminary Score: 8 4. How often during the last year have you found that you were not able to stop drinking once you had started?: Never 5. How often during the last year have you failed to do what was normally expected from you becasue of drinking?: Never 6. How often during the last year have you needed a first drink in the morning to get yourself going after a heavy drinking session?: Never 7. How often during the last year have you had a feeling of guilt of remorse after drinking?: Never 8. How often during the last year have you been unable to remember what happened the night before because you had been drinking?: Never 9. Have you or someone  else been injured as a result of your drinking?: No 10. Has a relative or friend or a doctor or another health worker been concerned about your drinking or suggested you cut down?: Yes, during the last year Alcohol Use Disorder Identification Test Final Score (AUDIT): 16 Brief Intervention: Patient declined brief intervention Substance Abuse History in the last 12 months:  Yes.   Consequences of  Substance Abuse: Unable to assess at this time.  Previous Psychotropic Medications: Yes  Psychological Evaluations: Yes  Past Medical History:  Past Medical History:  Diagnosis Date  . Anxiety   . Bipolar 2 disorder ([place])   . Depression   . Hypertension   . Pneumothorax    as a baby in the 34's  . Seizures ([place])    History reviewed. No pertinent surgical history. Family History:  Family History  Problem Relation Age of Onset  . Diabetes type II Mother   . Diabetes type II Unknown    Family Psychiatric  History: please see old notes Tobacco Screening: Have you used any form of tobacco in the last 30 days? (Cigarettes, Smokeless Tobacco, Cigars, and/or Pipes): Yes Tobacco use, Select all that apply: 5 or more cigarettes per day Are you interested in Tobacco Cessation Medications?: Yes, will notify MD for an order Counseled patient on smoking cessation including recognizing danger situations, developing coping skills and basic information about quitting provided: Yes Social History:  History  Alcohol Use  . 1.2 oz/week  . 2 Cans of beer per week    Comment: 1 40oz Fri. and Sat.     History  Drug Use  . Types: Cocaine    Comment: Used yesterday    Additional Social History:      Allergies:   Allergies  Allergen Reactions  . Levofloxacin Swelling  . Lisinopril Swelling   Lab Results:  Results for orders placed or performed during the hospital encounter of 09/07/16 (from the past 48 hour(s))  Comprehensive metabolic panel     Status: Abnormal   Collection Time: 09/07/16  8:00 AM  Result Value Ref Range   Sodium 140 135 - 145 mmol/L   Potassium 4.0 3.5 - 5.1 mmol/L   Chloride 102 101 - 111 mmol/L   CO2 25 22 - 32 mmol/L   Glucose, Bld 91 65 - 99 mg/dL   BUN 14 6 - 20 mg/dL   Creatinine, Ser 1.20 0.61 - 1.24 mg/dL   Calcium 9.5 8.9 - 10.3 mg/dL   Total Protein 7.1 6.5 - 8.1 g/dL   Albumin 4.1 3.5 - 5.0 g/dL   AST 27 15 - 41 U/L   ALT 18 17 - 63 U/L   Alkaline  Phosphatase 89 38 - 126 U/L   Total Bilirubin 1.3 (H) 0.3 - 1.2 mg/dL   GFR calc non Af Amer >60 >60 mL/min   GFR calc Af Amer >60 >60 mL/min    Comment: (NOTE) The eGFR has been calculated using the CKD EPI equation. This calculation has not been validated in all clinical situations. eGFR's persistently <60 mL/min signify possible Chronic Kidney Disease.    Anion gap 13 5 - 15  Ethanol     Status: None   Collection Time: 09/07/16  8:00 AM  Result Value Ref Range   Alcohol, Ethyl (B) <5 <5 mg/dL    Comment:        LOWEST DETECTABLE LIMIT FOR SERUM ALCOHOL IS 5 mg/dL FOR MEDICAL PURPOSES ONLY   Salicylate level  Status: None   Collection Time: 09/07/16  8:00 AM  Result Value Ref Range   Salicylate Lvl <1.4 2.8 - 30.0 mg/dL  Acetaminophen level     Status: Abnormal   Collection Time: 09/07/16  8:00 AM  Result Value Ref Range   Acetaminophen (Tylenol), Serum <10 (L) 10 - 30 ug/mL    Comment:        THERAPEUTIC CONCENTRATIONS VARY SIGNIFICANTLY. A RANGE OF 10-30 ug/mL MAY BE AN EFFECTIVE CONCENTRATION FOR MANY PATIENTS. HOWEVER, SOME ARE BEST TREATED AT CONCENTRATIONS OUTSIDE THIS RANGE. ACETAMINOPHEN CONCENTRATIONS >150 ug/mL AT 4 HOURS AFTER INGESTION AND >50 ug/mL AT 12 HOURS AFTER INGESTION ARE OFTEN ASSOCIATED WITH TOXIC REACTIONS.   cbc     Status: None   Collection Time: 09/07/16  8:00 AM  Result Value Ref Range   WBC 5.0 4.0 - 10.5 K/uL   RBC 4.87 4.22 - 5.81 MIL/uL   Hemoglobin 14.6 13.0 - 17.0 g/dL   HCT 43.0 39.0 - 52.0 %   MCV 88.3 78.0 - 100.0 fL   MCH 30.0 26.0 - 34.0 pg   MCHC 34.0 30.0 - 36.0 g/dL   RDW 14.9 11.5 - 15.5 %   Platelets 193 150 - 400 K/uL  Valproic acid level     Status: Abnormal   Collection Time: 09/07/16  8:00 AM  Result Value Ref Range   Valproic Acid Lvl <10 (L) 50.0 - 100.0 ug/mL    Comment: RESULTS CONFIRMED BY MANUAL DILUTION  Rapid urine drug screen (hospital performed)     Status: Abnormal   Collection Time: 09/07/16   8:22 AM  Result Value Ref Range   Opiates NONE DETECTED NONE DETECTED   Cocaine POSITIVE (A) NONE DETECTED   Benzodiazepines NONE DETECTED NONE DETECTED   Amphetamines NONE DETECTED NONE DETECTED   Tetrahydrocannabinol POSITIVE (A) NONE DETECTED   Barbiturates NONE DETECTED NONE DETECTED    Comment:        DRUG SCREEN FOR MEDICAL PURPOSES ONLY.  IF CONFIRMATION IS NEEDED FOR ANY PURPOSE, NOTIFY LAB WITHIN 5 DAYS.        LOWEST DETECTABLE LIMITS FOR URINE DRUG SCREEN Drug Class       Cutoff (ng/mL) Amphetamine      1000 Barbiturate      200 Benzodiazepine   481 Tricyclics       856 Opiates          300 Cocaine          300 THC              50     Blood Alcohol level:  Lab Results  Component Value Date   ETH <5 09/07/2016   ETH <5 31/49/7026    Metabolic Disorder Labs:  No results found for: HGBA1C, MPG Lab Results  Component Value Date   PROLACTIN 35.6 (H) 06/18/2016   Lab Results  Component Value Date   CHOL 216 (H) 06/18/2016   TRIG 45 06/18/2016   HDL 76 06/18/2016   CHOLHDL 2.8 06/18/2016   VLDL 9 06/18/2016   LDLCALC 131 (H) 06/18/2016   LDLCALC 99 07/15/2008    Current Medications: Current Facility-Administered Medications  Medication Dose Route Frequency Provider Last Rate Last Dose  . acetaminophen (TYLENOL) tablet 650 mg  650 mg Oral Q6H PRN Ethelene Hal, NP      . acetaminophen (TYLENOL) tablet 650 mg  650 mg Oral Q4H PRN Ethelene Hal, NP      . alum & Iris Pert  hydroxide-simeth (MAALOX/MYLANTA) 200-200-20 MG/5ML suspension 30 mL  30 mL Oral Q4H PRN Ethelene Hal, NP      . alum & mag hydroxide-simeth (MAALOX/MYLANTA) 200-200-20 MG/5ML suspension 30 mL  30 mL Oral Q6H PRN Ethelene Hal, NP      . divalproex (DEPAKOTE ER) 24 hr tablet 750 mg  750 mg Oral QHS Ethelene Hal, NP   750 mg at 09/07/16 2239  . FLUoxetine (PROZAC) capsule 20 mg  20 mg Oral Daily Ethelene Hal, NP   20 mg at 09/08/16 6433  .  gabapentin (NEURONTIN) capsule 200 mg  200 mg Oral BID Ethelene Hal, NP   200 mg at 09/08/16 2951  . hydrOXYzine (ATARAX/VISTARIL) tablet 25 mg  25 mg Oral Q6H PRN Ethelene Hal, NP      . hydrOXYzine (ATARAX/VISTARIL) tablet 25 mg  25 mg Oral Q6H PRN Ethelene Hal, NP   25 mg at 09/07/16 2239  . ibuprofen (ADVIL,MOTRIN) tablet 600 mg  600 mg Oral Q8H PRN Ethelene Hal, NP      . loperamide (IMODIUM) capsule 2-4 mg  2-4 mg Oral PRN Ethelene Hal, NP      . LORazepam (ATIVAN) tablet 1 mg  1 mg Oral Q6H PRN Ethelene Hal, NP      . LORazepam (ATIVAN) tablet 1 mg  1 mg Oral QID Ethelene Hal, NP   1 mg at 09/08/16 8841   Followed by  . [START ON 09/09/2016] LORazepam (ATIVAN) tablet 1 mg  1 mg Oral TID Ethelene Hal, NP       Followed by  . [START ON 09/10/2016] LORazepam (ATIVAN) tablet 1 mg  1 mg Oral BID Ethelene Hal, NP       Followed by  . [START ON 09/12/2016] LORazepam (ATIVAN) tablet 1 mg  1 mg Oral Daily Ethelene Hal, NP      . magnesium hydroxide (MILK OF MAGNESIA) suspension 30 mL  30 mL Oral Daily PRN Ethelene Hal, NP      . multivitamin with minerals tablet 1 tablet  1 tablet Oral Daily Ethelene Hal, NP   1 tablet at 09/08/16 [phone]  . nicotine (NICODERM CQ - dosed in mg/24 hours) patch 21 mg  21 mg Transdermal Daily Ethelene Hal, NP   21 mg at 09/08/16 0836  . ondansetron (ZOFRAN-ODT) disintegrating tablet 4 mg  4 mg Oral Q6H PRN Ethelene Hal, NP      . risperiDONE (RISPERDAL) tablet 0.5 mg  0.5 mg Oral Karl Bales, NP   0.5 mg at 09/08/16 0836  . thiamine (B-1) injection 100 mg  100 mg Intramuscular Once Ethelene Hal, NP      . thiamine (VITAMIN B-1) tablet 100 mg  100 mg Oral Daily Ethelene Hal, NP   100 mg at 09/08/16 0836  . traZODone (DESYREL) tablet 50 mg  50 mg Oral QHS PRN Ethelene Hal, NP   50 mg at 09/07/16 2239  .  traZODone (DESYREL) tablet 50 mg  50 mg Oral QHS PRN Ethelene Hal, NP   50 mg at 09/07/16 2239   PTA Medications: Prescriptions Prior to Admission  Medication Sig Dispense Refill Last Dose  . albuterol (PROVENTIL HFA;VENTOLIN HFA) 108 (90 Base) MCG/ACT inhaler Inhale 2 puffs into the lungs every 4 (four) hours. For shortness of breath   prn  . amLODipine (NORVASC) 10 MG tablet Take 1 tablet (10  mg total) by mouth daily. (Patient not taking: Reported on 09/07/2016) 30 tablet 0 Not Taking at Unknown time  . diphenhydrAMINE (BENADRYL) 25 MG tablet Take 1 tablet (25 mg total) by mouth every 6 (six) hours as needed for itching. (Patient not taking: Reported on 09/07/2016) 20 tablet 0 Not Taking at Unknown time  . divalproex (DEPAKOTE ER) 250 MG 24 hr tablet Take 3 tablets (750 mg total) by mouth at bedtime. 30 tablet 0 09/05/2016  . FLUoxetine (PROZAC) 20 MG capsule Take 1 capsule (20 mg total) by mouth daily. For depression 30 capsule 0 09/05/2016  . gabapentin (NEURONTIN) 100 MG capsule Take 2 capsules (200 mg total) by mouth 2 (two) times daily. For agitation 120 capsule 0 09/05/2016  . guaiFENesin (MUCINEX) 600 MG 12 hr tablet Take 2 tablets (1,200 mg total) by mouth 2 (two) times daily as needed for cough or to loosen phlegm. (Patient not taking: Reported on 09/07/2016) 10 tablet 0 Not Taking at Unknown time  . hydrochlorothiazide (HYDRODIURIL) 25 MG tablet Take 1 tablet (25 mg total) by mouth daily. (Patient not taking: Reported on 09/07/2016) 30 tablet 0 Not Taking at Unknown time  . hydrochlorothiazide (HYDRODIURIL) 25 MG tablet Take 1 tablet (25 mg total) by mouth daily. For high blood pressure 30 tablet 0 09/05/2016  . lisinopril (PRINIVIL,ZESTRIL) 20 MG tablet Take 1 tablet (20 mg total) by mouth daily. For high blood pressure (Patient not taking: Reported on 09/07/2016) 30 tablet 0 Not Taking at Unknown time  . nicotine (NICODERM CQ - DOSED IN MG/24 HOURS) 21 mg/24hr patch Place 1 patch (21 mg total)  onto the skin daily. For smoking cessation (Patient not taking: Reported on 09/07/2016) 28 patch 0 Not Taking at Unknown time  . ranitidine (ZANTAC) 150 MG capsule Take 1 capsule (150 mg total) by mouth 2 (two) times daily. (Patient not taking: Reported on 09/07/2016) 10 capsule 0 Not Taking at Unknown time  . risperiDONE (RISPERDAL) 0.5 MG tablet Take 1 tablet (0.5 mg total) by mouth every morning. 30 tablet 0 09/05/2016  . risperiDONE (RISPERDAL) 1 MG tablet Take 1 tablet (1 mg total) by mouth at bedtime. (Patient not taking: Reported on 09/07/2016) 30 tablet 0 Not Taking at Unknown time  . traZODone (DESYREL) 50 MG tablet Take 1 tablet (50 mg) at bedtime: For sleep 30 tablet 0 09/05/2016    Musculoskeletal: Strength & Muscle Tone: Unable to assess at this time.  Gait & Station: Unable to assess at this time.  Patient leans: Unable to assess at this time.   Psychiatric Specialty Exam: Physical Exam  Constitutional: No distress.  Respiratory: Effort normal.  Skin: He is not diaphoretic.  Psychiatric:  As above    ROS  Blood pressure (!) 144/76, pulse 85, temperature 97.7 F (36.5 C), temperature source Oral, resp. rate 16, height 5' 4"  (1.626 m), weight 52.2 kg (115 lb).Body mass index is 19.74 kg/m.  General Appearance: In bed lying face down. Not engaging.   Eye Contact:  None  Speech:  Mute  Volume:  Barely comprehensible   Mood: Unable to assess at this time.   Affect:  Flat  Thought Process:  Poverty of thought  Orientation:  Unable to assess at this time.   Thought Content:  Unable to assess at this time.   Suicidal Thoughts:  Yes.  with intent/plan  Homicidal Thoughts:  Yes.  without intent/plan  Memory:  Unable to assess at this time.   Judgement:  Impaired  Insight:  Unable to assess at this time.   Psychomotor Activity:  Decreased  Concentration:  Unable to assess at this time.   Recall:  Unable to assess at this time.   Fund of Knowledge: Unable to assess at this time.    Language:  Unable to assess at this time.   Akathisia:  Negative  Handed:    AIMS (if indicated):     Assets:  Unable to assess at this time.   ADL's:  Impaired  Cognition:  Impaired,  Moderate  Sleep:  Number of Hours: 6.5    Treatment Plan Summary: Patient is currently under the influence of cocaine and THC. His level of cognition is altered. He is not able to engage appropriately. I have reviewed his old records. Typically has not come in with alcohol in his system. I have discontinued scheduled benzodiazepines. We would only use PRN that would be determined by CIWA scores. Would increase Risperidone to BID to target psychosis and rage.   Observation Level/Precautions:  Detox 15 minute checks  Laboratory:    Psychotherapy:    Medications:    Consultations:    Discharge Concerns:    Estimated LOS:  Other:     Physician Treatment Plan for Primary Diagnosis: <principal problem not specified> Long Term Goal(s): Improvement in symptoms so as ready for discharge  Short Term Goals: Ability to identify changes in lifestyle to reduce recurrence of condition will improve, Ability to verbalize feelings will improve, Ability to disclose and discuss suicidal ideas, Ability to demonstrate self-control will improve, Ability to identify and develop effective coping behaviors will improve, Ability to maintain clinical measurements within normal limits will improve, Compliance with prescribed medications will improve and Ability to identify triggers associated with substance abuse/mental health issues will improve  Physician Treatment Plan for Secondary Diagnosis: Active Problems:   Bipolar 2 disorder, major depressive episode ([place])  Long Term Goal(s): Improvement in symptoms so as ready for discharge  Short Term Goals: Ability to identify changes in lifestyle to reduce recurrence of condition will improve, Ability to verbalize feelings will improve, Ability to disclose and discuss suicidal  ideas, Ability to demonstrate self-control will improve, Ability to identify and develop effective coping behaviors will improve, Ability to maintain clinical measurements within normal limits will improve, Compliance with prescribed medications will improve and Ability to identify triggers associated with substance abuse/mental health issues will improve  I certify that inpatient services furnished can reasonably be expected to improve the patient's condition.    [place], MD 7/6/20181:58 PM

## 2016-09-08 NOTE — Progress Notes (Signed)
Nursing Note: 0700-1900  D:  Upon assessment, pt presents with depressed mood and flat affect, slightly irritable. States "I don't know why I am still in bed, I usually get up at 4:30am and to start my Bible devotions."  Pt's gait unsteady at lunchtime, needing assistance walking back to room- 1200 Lorazapam held. States that he has heard auditory hallucinations for most of his life, same voice that says "Kill, kill, kill."  Pt denies any thought or plan to hurt anyone at this time, "I try to ignore the voice."  A:  Encouraged to verbalize needs and concerns, active listening and support provided.  Continued Q 15 minute safety checks.  Observed active participation in group settings.  R:  Pt. isolative, stays in room most of shift.  Denies current A/V hallucinations and is able to verbally contract for safety.

## 2016-09-08 NOTE — Progress Notes (Signed)
Nursing Progress Note 1900-0730  D) Patient presents with flat affect and irritable mood. Patient states "I remember you". Patient is observed up watching television in the dayroom. Patient reports passive SI and auditory hallucinations but currently denies HI or pain. Patient contracts for safety on the unit. Patient reports sleeping well with current regimen.  A) Emotional support given. 1:1 interaction and active listening provided. Patient medicated as prescribed. Medications and plan of care reviewed with patient. Patient verbalized understanding without further questions. Snacks and fluids provided. Opportunities for questions or concerns presented to patient. Patient encouraged to continue to work on treatment goals. Labs, vital signs and patient behavior monitored throughout shift. Patient safety maintained with q15 min safety checks. Moderate fall risk precautions in place and reviewed with patient; patient verbalized understanding.  R) Patient receptive to interaction with nurse. Patient remains safe on the unit at this time. Patient denies any adverse medication reactions at this time. Patient is resting in bed without complaints. Will continue to monitor.

## 2016-09-08 NOTE — Tx Team (Signed)
Interdisciplinary Treatment and Diagnostic Plan Update  09/08/2016 Time of Session: [place] MRN: [id number]  Principal Diagnosis: Bipolar Disorder  Secondary Diagnoses: Active Problems:   Bipolar 2 disorder, major depressive episode (HCC)   Current Medications:  Current Facility-Administered Medications  Medication Dose Route Frequency Provider Last Rate Last Dose  . acetaminophen (TYLENOL) tablet 650 mg  650 mg Oral Q6H PRN Ethelene Hal, NP      . acetaminophen (TYLENOL) tablet 650 mg  650 mg Oral Q4H PRN Ethelene Hal, NP      . alum & mag hydroxide-simeth (MAALOX/MYLANTA) 200-200-20 MG/5ML suspension 30 mL  30 mL Oral Q4H PRN Ethelene Hal, NP      . alum & mag hydroxide-simeth (MAALOX/MYLANTA) 200-200-20 MG/5ML suspension 30 mL  30 mL Oral Q6H PRN Ethelene Hal, NP      . divalproex (DEPAKOTE ER) 24 hr tablet 750 mg  750 mg Oral QHS Ethelene Hal, NP   750 mg at 09/07/16 2239  . FLUoxetine (PROZAC) capsule 20 mg  20 mg Oral Daily Ethelene Hal, NP   20 mg at 09/08/16 7494  . gabapentin (NEURONTIN) capsule 200 mg  200 mg Oral BID Ethelene Hal, NP   200 mg at 09/08/16 4967  . hydrOXYzine (ATARAX/VISTARIL) tablet 25 mg  25 mg Oral Q6H PRN Ethelene Hal, NP      . hydrOXYzine (ATARAX/VISTARIL) tablet 25 mg  25 mg Oral Q6H PRN Ethelene Hal, NP   25 mg at 09/07/16 2239  . ibuprofen (ADVIL,MOTRIN) tablet 600 mg  600 mg Oral Q8H PRN Ethelene Hal, NP      . loperamide (IMODIUM) capsule 2-4 mg  2-4 mg Oral PRN Ethelene Hal, NP      . LORazepam (ATIVAN) tablet 1 mg  1 mg Oral Q6H PRN Ethelene Hal, NP      . LORazepam (ATIVAN) tablet 1 mg  1 mg Oral QID Ethelene Hal, NP   1 mg at 09/08/16 5916   Followed by  . [START ON 09/09/2016] LORazepam (ATIVAN) tablet 1 mg  1 mg Oral TID Ethelene Hal, NP       Followed by  . [START ON 09/10/2016] LORazepam (ATIVAN) tablet 1 mg  1  mg Oral BID Ethelene Hal, NP       Followed by  . [START ON 09/12/2016] LORazepam (ATIVAN) tablet 1 mg  1 mg Oral Daily Ethelene Hal, NP      . magnesium hydroxide (MILK OF MAGNESIA) suspension 30 mL  30 mL Oral Daily PRN Ethelene Hal, NP      . multivitamin with minerals tablet 1 tablet  1 tablet Oral Daily Ethelene Hal, NP   1 tablet at 09/08/16 [phone]  . nicotine (NICODERM CQ - dosed in mg/24 hours) patch 21 mg  21 mg Transdermal Daily Ethelene Hal, NP   21 mg at 09/08/16 0836  . ondansetron (ZOFRAN-ODT) disintegrating tablet 4 mg  4 mg Oral Q6H PRN Ethelene Hal, NP      . risperiDONE (RISPERDAL) tablet 0.5 mg  0.5 mg Oral Karl Bales, NP   0.5 mg at 09/08/16 0836  . thiamine (B-1) injection 100 mg  100 mg Intramuscular Once Ethelene Hal, NP      . thiamine (VITAMIN B-1) tablet 100 mg  100 mg Oral Daily Ethelene Hal, NP   100 mg at 09/08/16 0836  . traZODone (  DESYREL) tablet 50 mg  50 mg Oral QHS PRN Ethelene Hal, NP   50 mg at 09/07/16 2239  . traZODone (DESYREL) tablet 50 mg  50 mg Oral QHS PRN Ethelene Hal, NP   50 mg at 09/07/16 2239   PTA Medications: Prescriptions Prior to Admission  Medication Sig Dispense Refill Last Dose  . albuterol (PROVENTIL HFA;VENTOLIN HFA) 108 (90 Base) MCG/ACT inhaler Inhale 2 puffs into the lungs every 4 (four) hours. For shortness of breath   prn  . amLODipine (NORVASC) 10 MG tablet Take 1 tablet (10 mg total) by mouth daily. (Patient not taking: Reported on 09/07/2016) 30 tablet 0 Not Taking at Unknown time  . diphenhydrAMINE (BENADRYL) 25 MG tablet Take 1 tablet (25 mg total) by mouth every 6 (six) hours as needed for itching. (Patient not taking: Reported on 09/07/2016) 20 tablet 0 Not Taking at Unknown time  . divalproex (DEPAKOTE ER) 250 MG 24 hr tablet Take 3 tablets (750 mg total) by mouth at bedtime. 30 tablet 0 09/05/2016  . FLUoxetine (PROZAC) 20 MG  capsule Take 1 capsule (20 mg total) by mouth daily. For depression 30 capsule 0 09/05/2016  . gabapentin (NEURONTIN) 100 MG capsule Take 2 capsules (200 mg total) by mouth 2 (two) times daily. For agitation 120 capsule 0 09/05/2016  . guaiFENesin (MUCINEX) 600 MG 12 hr tablet Take 2 tablets (1,200 mg total) by mouth 2 (two) times daily as needed for cough or to loosen phlegm. (Patient not taking: Reported on 09/07/2016) 10 tablet 0 Not Taking at Unknown time  . hydrochlorothiazide (HYDRODIURIL) 25 MG tablet Take 1 tablet (25 mg total) by mouth daily. (Patient not taking: Reported on 09/07/2016) 30 tablet 0 Not Taking at Unknown time  . hydrochlorothiazide (HYDRODIURIL) 25 MG tablet Take 1 tablet (25 mg total) by mouth daily. For high blood pressure 30 tablet 0 09/05/2016  . lisinopril (PRINIVIL,ZESTRIL) 20 MG tablet Take 1 tablet (20 mg total) by mouth daily. For high blood pressure (Patient not taking: Reported on 09/07/2016) 30 tablet 0 Not Taking at Unknown time  . nicotine (NICODERM CQ - DOSED IN MG/24 HOURS) 21 mg/24hr patch Place 1 patch (21 mg total) onto the skin daily. For smoking cessation (Patient not taking: Reported on 09/07/2016) 28 patch 0 Not Taking at Unknown time  . ranitidine (ZANTAC) 150 MG capsule Take 1 capsule (150 mg total) by mouth 2 (two) times daily. (Patient not taking: Reported on 09/07/2016) 10 capsule 0 Not Taking at Unknown time  . risperiDONE (RISPERDAL) 0.5 MG tablet Take 1 tablet (0.5 mg total) by mouth every morning. 30 tablet 0 09/05/2016  . risperiDONE (RISPERDAL) 1 MG tablet Take 1 tablet (1 mg total) by mouth at bedtime. (Patient not taking: Reported on 09/07/2016) 30 tablet 0 Not Taking at Unknown time  . traZODone (DESYREL) 50 MG tablet Take 1 tablet (50 mg) at bedtime: For sleep 30 tablet 0 09/05/2016    Patient Stressors: Financial difficulties Marital or family conflict Medication change or noncompliance Occupational concerns Substance abuse  Patient Strengths: Average or  above average Architect for treatment/growth Supportive family/friends  Treatment Modalities: Medication Management, Group therapy, Case management,  1 to 1 session with clinician, Psychoeducation, Recreational therapy.   Physician Treatment Plan for Primary Diagnosis: Bipolar Disorder Long Term Goal(s): Improvement in symptoms so as ready for discharge Improvement in symptoms so as ready for discharge   Short Term Goals: Ability to identify changes in lifestyle to reduce recurrence  of condition will improve Ability to verbalize feelings will improve Ability to disclose and discuss suicidal ideas Ability to demonstrate self-control will improve Ability to identify and develop effective coping behaviors will improve Compliance with prescribed medications will improve Ability to identify triggers associated with substance abuse/mental health issues will improve  Medication Management: Evaluate patient's response, side effects, and tolerance of medication regimen.  Therapeutic Interventions: 1 to 1 sessions, Unit Group sessions and Medication administration.  Evaluation of Outcomes: Not Met  Physician Treatment Plan for Secondary Diagnosis: Active Problems:   Bipolar 2 disorder, major depressive episode ([place])  Long Term Goal(s): Improvement in symptoms so as ready for discharge Improvement in symptoms so as ready for discharge   Short Term Goals: Ability to identify changes in lifestyle to reduce recurrence of condition will improve Ability to verbalize feelings will improve Ability to disclose and discuss suicidal ideas Ability to demonstrate self-control will improve Ability to identify and develop effective coping behaviors will improve Compliance with prescribed medications will improve Ability to identify triggers associated with substance abuse/mental health issues will improve     Medication Management: Evaluate patient's response, side  effects, and tolerance of medication regimen.  Therapeutic Interventions: 1 to 1 sessions, Unit Group sessions and Medication administration.  Evaluation of Outcomes: Not Met   RN Treatment Plan for Primary Diagnosis: Bipolar Disorder Long Term Goal(s): Knowledge of disease and therapeutic regimen to maintain health will improve  Short Term Goals: Ability to remain free from injury will improve, Ability to verbalize feelings will improve and Ability to disclose and discuss suicidal ideas  Medication Management: RN will administer medications as ordered by provider, will assess and evaluate patient's response and provide education to patient for prescribed medication. RN will report any adverse and/or side effects to prescribing provider.  Therapeutic Interventions: 1 on 1 counseling sessions, Psychoeducation, Medication administration, Evaluate responses to treatment, Monitor vital signs and CBGs as ordered, Perform/monitor CIWA, COWS, AIMS and Fall Risk screenings as ordered, Perform wound care treatments as ordered.  Evaluation of Outcomes: Not Met   LCSW Treatment Plan for Primary Diagnosis: Bipolar Disorder Long Term Goal(s): Safe transition to appropriate next level of care at discharge, Engage patient in therapeutic group addressing interpersonal concerns.  Short Term Goals: Engage patient in aftercare planning with referrals and resources, Facilitate patient progression through stages of change regarding substance use diagnoses and concerns and Identify triggers associated with mental health/substance abuse issues  Therapeutic Interventions: Assess for all discharge needs, 1 to 1 time with Social worker, Explore available resources and support systems, Assess for adequacy in community support network, Educate family and significant other(s) on suicide prevention, Complete Psychosocial Assessment, Interpersonal group therapy.  Evaluation of Outcomes: Not Met   Progress in  Treatment: Attending groups: No. Participating in groups: No. New to unit. Continuing to assess.  Taking medication as prescribed: Yes. Toleration medication: Yes. Family/Significant other contact made: No, will contact:  family member if patient consents Patient understands diagnosis: Yes. Discussing patient identified problems/goals with staff: Yes. Medical problems stabilized or resolved: Yes. Denies suicidal/homicidal ideation: Yes. Issues/concerns per patient self-inventory: No. Other: n/a   New problem(s) identified: No, Describe:  n/a  New Short Term/Long Term Goal(s): detox, medication stabilization; mood stabilization; development of comprehensive mental wellness/sobriety plan.   Discharge Plan or Barriers: CSW assessing for appropriate referrals. Pt has history at [facility]. He was last admitted to [facility] 06/12/16 and was referred directly to [facility] for treatment.   Reason for Continuation of Hospitalization:  Depression Medication stabilization Suicidal ideation Withdrawal symptoms  Estimated Length of Stay: tentative discharge on Tuesday, 09-12-16  Attendees: Patient: 09/08/2016 11:27 AM  Physician: Dr. Sanjuana Letters MD 09/08/2016 11:27 AM  Nursing: Zena Amos RN 09/08/2016 11:27 AM  RN Care Manager: Lars Pinks CM 09/08/2016 11:27 AM  Social Worker: Maxie Better, LCSW 09/08/2016 11:27 AM  Recreational Therapist: x 09/08/2016 11:27 AM  Other: Lindell Spar NP; Mordecai Maes NP 09/08/2016 11:27 AM  Other:  09/08/2016 11:27 AM  Other: 09/08/2016 11:27 AM    Scribe for Treatment Team: [place], LCSW 09/08/2016 11:27 AM

## 2016-09-08 NOTE — [facility] Suicide Risk Assessment (Signed)
[facility]   Nursing information obtained from:  Patient Demographic factors:  Male, Low socioeconomic status, Unemployed Current Mental Status:  Suicidal ideation indicated by patient, Suicide plan, Plan includes specific time, place, or method, Self-harm thoughts, Self-harm behaviors Loss Factors:  Decrease in vocational status, Loss of significant relationship, Financial problems / change in socioeconomic status Historical Factors:  Prior suicide attempts, Family history of mental illness or substance abuse Risk Reduction Factors:  Living with another person, especially a relative, Positive social support  Total Time spent with patient: 20 minutes Principal Problem: Substance Induced Psychotic Disorder Diagnosis:   Patient Active Problem List   Diagnosis Date Noted  . Bipolar 2 disorder, major depressive episode (HCC) [F31.81] 09/07/2016  . 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 Major depressive disorder, recurrent episode, severe, with psychosis (HCC) [F33.3] 06/12/2016  . Cocaine use disorder, severe, dependence (HCC) [F14.20] 06/12/2016  . Major depressive disorder, recurrent severe without psychotic features (HCC) [F33.2] 06/12/2016  . Seizure (HCC) [R56.9] 11/17/2011  . Tobacco abuse [Z72.0] 11/17/2011  . Alcohol abuse [F10.10] 11/17/2011  . Cocaine abuse [F14.10] 11/17/2011  . Bipolar 2 disorder (HCC) [F31.81] 11/17/2011  . Hypertension [I10] 11/17/2011  . Hydrocephalus [G91.9] 11/17/2011  . Aspiration pneumonia (HCC) [J69.0] 11/17/2011   Subjective Data:  46 yo AAM. 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 Self presented to the ER reports thoughts of violence. Reports auditory hallucination that are derogatory. He has been having thoughts of walking into th traffic. Reports visualization of the word "kill" whenever he closes his eyes. Intoxicated with cocaine and THC. Patient is sleeping in his  room. Refused to engaged. Sedated from medications. He is getting benzodiazepines for alcohol withdrawals. BAL was negative and he has not come in with elevated BAL in past hospital visits.    Continued Clinical Symptoms:  Alcohol Use Disorder Identification Test Final Score (AUDIT): 16 The "Alcohol Use Disorders Identification Test", Guidelines for Use in Primary Care, Second Edition.  World Science [employer] [facility](WHO). Score between 0-7:  no or low risk or alcohol related problems. Score between 8-15:  moderate risk of alcohol related problems. Score between 16-19:  high risk of alcohol related problems. Score 20 or above:  warrants further diagnostic evaluation for alcohol dependence and treatment.   CLINICAL FACTORS:   Psychosis Substance use disorder   Musculoskeletal: Strength & Muscle Tone: within normal limits Gait & Station: normal Patient leans: N/A  Psychiatric Specialty Exam: Physical Exam  unable to assess  ROS  unable to assess  Blood pressure (!) 144/76, pulse 85, temperature 97.7 F (36.5 C), temperature source Oral, resp. rate 16, height 5\' 4"  (1.626 m), weight 52.2 kg (115 lb).Body mass index is 19.74 kg/m.  General Appearance: Disheveled and Guarded  Eye Contact:  None  Speech:  Mute  Volume:  Barely comprehensible  Mood:  Unable to assess at this time  Affect:  Flat   Thought Process:  Unable to assess at this time.   Orientation:  Unable to assess  Thought Content:  Unable to assess at this time.   Suicidal Thoughts:  Unable to assess at this time.   Homicidal Thoughts:  Unable to assess at this time.   Memory:  Unable to assess at this time.   Judgement:  Impaired  Insight:  Unable to assess at this time.   Psychomotor Activity:  Psychomotor Retardation  Concentration:  Concentration: Poor and Attention Span: Poor  Recall:  Unable to assess  Fund of Knowledge:  Poor  Language:  Fair   Akathisia:    Handed:    AIMS (if indicated):     Assets:   Unable to assess  ADL's:  Impaired  Cognition:  Impaired,  Mild  Sleep:  Number of Hours: 6.5      COGNITIVE FEATURES THAT CONTRIBUTE TO RISK:  Loss of executive function    SUICIDE RISK:   Severe:  Frequent, intense, and enduring suicidal ideation, specific plan, no subjective intent, but some objective markers of intent (i.e., choice of lethal method), the method is accessible, some limited preparatory behavior, evidence of impaired self-control, severe dysphoria/symptomatology, multiple risk factors present, and few if any protective factors, particularly a lack of social support.  PLAN OF CARE:   1. Suicide precautions 2. Discontinue scheduled benzodiazepines 3. Risperidone 0.5 mg BID 4. Would review him later.   I certify that inpatient services furnished can reasonably be expected to improve the patient's condition.   Georgiann Cocker, MD 09/08/2016, 2:16 PM

## 2016-09-08 NOTE — [facility] Group Notes (Signed)
  [facility] LCSW Group Therapy  09/08/2016 2:58 PM  Type of Therapy:  Group Therapy  Participation Level:  Did Not Attend-pt invited. Chose to remain in bed.    Summary of Progress/Problems: Feelings around Relapse. Group members discussed the meaning of relapse and shared personal stories of relapse, how it affected them and others, and how they perceived themselves during this time. Group members were encouraged to identify triggers, warning signs and coping skills used when facing the possibility of relapse. Social supports were discussed and explored in detail. Post Acute Withdrawal Syndrome (handout provided) was introduced and examined. Pt's were encouraged to ask questions, talk about key points associated with PAWS, and process this information in terms of relapse prevention.   William Laske N Smart LCSW 09/08/2016, 2:58 PM  

## 2016-09-09 NOTE — Progress Notes (Signed)
Patient denies SI, HI and AVH.  Patient reported a decrease in withdrawal symptoms.  Patient has been attending groups compliant with medications and engaged in unit activities.    Assess patient for safety, offer medications as prescribed, engage patient in 1:1 staff talk, offer medications as prescribed.   Continue to monitor patient.  Patient able to contract for safety.  

## 2016-09-09 NOTE — Progress Notes (Signed)
[facility] MD Progress Note  09/09/2016 11:18 AM Brian Lucero  MRN:  [id number] Subjective:   46 yo AAM. 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 Self presented to the ER reports thoughts of violence. Reports auditory hallucination that are derogatory. He has been having thoughts of walking into th traffic. Reports visualization of the word "kill" whenever he closes his eyes. Intoxicated with cocaine and THC. Patient is sleeping in his room. Refused to engaged. Sedated from medications. He is getting benzodiazepines for alcohol withdrawals. BAL was negative and he has not come in with elevated BAL in past hospital visits.  Chart reviewed today. Patient discussed at team  Staff reports that he has not shown any evidence of alcohol withdrawals. He is more alert and has been interacting well with peers. He is grooming.  Seen today. Says he did not get into [facility] treatment center after last discharge. He was staying with a friend. He had relapsed into drugs. Started having bad thoughts. Says he had to come into the hospital before he does anything "crazy". Says he is feeling better now. Says he feels his mind is clearer. Says he loves to engage with people. Tells me that he does not have any suicidal thoughts today. He does not have any thoughts of violence today. No hallucinations today. No feeling of impending doom. Last seizure was over a year ago. Patient hopes he can get into treatment from here.    Principal Problem: Substance Induced Mood Disorder Diagnosis:   Patient Active Problem List   Diagnosis Date Noted  . Bipolar 2 disorder, major depressive episode (HCC) [F31.81] 09/07/2016  . 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 Major depressive disorder, recurrent episode, severe, with psychosis (HCC) [F33.3] 06/12/2016  . Cocaine use disorder, severe, dependence (HCC) [F14.20] 06/12/2016  . Major depressive disorder, recurrent severe without  psychotic features (HCC) [F33.2] 06/12/2016  . Seizure (HCC) [R56.9] 11/17/2011  . Tobacco abuse [Z72.0] 11/17/2011  . Alcohol abuse [F10.10] 11/17/2011  . Cocaine abuse [F14.10] 11/17/2011  . Bipolar 2 disorder (HCC) [F31.81] 11/17/2011  . Hypertension [I10] 11/17/2011  . Hydrocephalus [G91.9] 11/17/2011  . Aspiration pneumonia (HCC) [J69.0] 11/17/2011   Total Time spent with patient: 20 minutes  Past Psychiatric History: As in H&P  Past Medical History:  Past Medical History:  Diagnosis Date  . Anxiety   . Bipolar 2 disorder (HCC)   . Depression   . Hypertension   . Pneumothorax    as a baby in the 26's  . Seizures (HCC)    History reviewed. No pertinent surgical history. Family History:  Family History  Problem Relation Age of Onset  . Diabetes type II Mother   . Diabetes type II Unknown    Family Psychiatric  History: . As in H&P Social History:  History  Alcohol Use  . 1.2 oz/week  . 2 Cans of beer per week    Comment: 1 40oz Fri. and Sat.     History  Drug Use  . Types: Cocaine    Comment: Used yesterday    Social History   Social History  . Marital status: Single    Spouse name: N/A  . Number of children: N/A  . Years of education: N/A   Social History Main Topics  . Smoking status: Current Every Day Smoker    Packs/day: 0.20    Types: Cigarettes  . Smokeless tobacco: Never Used  . Alcohol use 1.2 oz/week    2 Cans of beer  per week     Comment: 1 40oz Fri. and Sat.  . Drug use: Yes    Types: Cocaine     Comment: Used yesterday  . Sexual activity: Yes   Other Topics Concern  . None   Social History Narrative  . None   Additional Social History:   Sleep: Good  Appetite:  Good  Current Medications: Current Facility-Administered Medications  Medication Dose Route Frequency Provider Last Rate Last Dose  . acetaminophen (TYLENOL) tablet 650 mg  650 mg Oral Q4H PRN Laveda Abbe, NP      . alum & mag hydroxide-simeth  (MAALOX/MYLANTA) 200-200-20 MG/5ML suspension 30 mL  30 mL Oral Q6H PRN Laveda Abbe, NP      . divalproex (DEPAKOTE ER) 24 hr tablet 750 mg  750 mg Oral QHS Laveda Abbe, NP   750 mg at 09/08/16 2220  . FLUoxetine (PROZAC) capsule 20 mg  20 mg Oral Daily Laveda Abbe, NP   20 mg at 09/09/16 0911  . gabapentin (NEURONTIN) capsule 200 mg  200 mg Oral BID Laveda Abbe, NP   200 mg at 09/09/16 9147  . hydrOXYzine (ATARAX/VISTARIL) tablet 25 mg  25 mg Oral Q6H PRN Laveda Abbe, NP   25 mg at 09/07/16 2239  . ibuprofen (ADVIL,MOTRIN) tablet 600 mg  600 mg Oral Q8H PRN Laveda Abbe, NP      . loperamide (IMODIUM) capsule 2-4 mg  2-4 mg Oral PRN Laveda Abbe, NP      . magnesium hydroxide (MILK OF MAGNESIA) suspension 30 mL  30 mL Oral Daily PRN Laveda Abbe, NP      . multivitamin with minerals tablet 1 tablet  1 tablet Oral Daily Laveda Abbe, NP   1 tablet at 09/09/16 0910  . nicotine (NICODERM CQ - dosed in mg/24 hours) patch 21 mg  21 mg Transdermal Daily Laveda Abbe, NP   21 mg at 09/09/16 0910  . ondansetron (ZOFRAN-ODT) disintegrating tablet 4 mg  4 mg Oral Q6H PRN Laveda Abbe, NP      . risperiDONE (RISPERDAL) tablet 0.5 mg  0.5 mg Oral BID Georgiann Cocker, MD   0.5 mg at 09/09/16 0911  . traZODone (DESYREL) tablet 50 mg  50 mg Oral QHS PRN Laveda Abbe, NP   50 mg at 09/08/16 2220    Lab Results: No results found for this or any previous visit (from the past 48 hour(s)).  Blood Alcohol level:  Lab Results  Component Value Date   ETH <5 09/07/2016   ETH <5 06/11/2016    Metabolic Disorder Labs: No results found for: HGBA1C, MPG Lab Results  Component Value Date   PROLACTIN 35.6 (H) 06/18/2016   Lab Results  Component Value Date   CHOL 216 (H) 06/18/2016   TRIG 45 06/18/2016   HDL 76 06/18/2016   CHOLHDL 2.8 06/18/2016   VLDL 9 06/18/2016   LDLCALC 131 (H) 06/18/2016    LDLCALC 99 07/15/2008    Physical Findings: AIMS: Facial and Oral Movements Muscles of Facial Expression: None, normal Lips and Perioral Area: None, normal Jaw: None, normal Tongue: None, normal,Extremity Movements Upper (arms, wrists, hands, fingers): None, normal Lower (legs, knees, ankles, toes): None, normal, Trunk Movements Neck, shoulders, hips: None, normal, Overall Severity Severity of abnormal movements (highest score from questions above): None, normal Incapacitation due to abnormal movements: None, normal Patient's awareness of abnormal movements (rate only patient's report): No  Awareness, Dental Status Current problems with teeth and/or dentures?: No Does patient usually wear dentures?: No  CIWA:  CIWA-Ar Total: 1 COWS:     Musculoskeletal: Strength & Muscle Tone: Atrophy and spasticity is at baseline.  Gait & Station: Scissors gait from likely CP Patient leans: As above  Psychiatric Specialty Exam: Physical Exam  Constitutional: He is oriented to person, place, and time. No distress.  HENT:  Head: Normocephalic.  Eyes: Pupils are equal, round, and reactive to light.  Neck: Neck supple.  Cardiovascular: Normal rate.   Respiratory: Effort normal.  Neurological: He is alert and oriented to person, place, and time.  Skin: Skin is warm and dry. He is not diaphoretic.  Psychiatric:  As above    ROS  Blood pressure (!) 177/120, pulse (!) 102, temperature 98.5 F (36.9 C), temperature source Oral, resp. rate (!) 24, height 5\' 4"  (1.626 m), weight 52.2 kg (115 lb).Body mass index is 19.74 kg/m.  General Appearance: Neatly dressed. Pleasant and engages well. Appropriate behavior. Not internally distracted.  Eye Contact:  Good  Speech:  Clear and Coherent  Volume:  Normal  Mood:  Feels much better  Affect:  Appropriate and Restricted  Thought Process:  Linear  Orientation:  Full (Time, Place, and Person)  Thought Content:  No thoughts of violence. No delusional  theme. No hallucination in any modality.   Suicidal Thoughts:  No  Homicidal Thoughts:  No  Memory:  Immediate;   Good Recent;   Fair Remote;   Good  Judgement:  Fair  Insight:  Fair  Psychomotor Activity:  Normal  Concentration:  Concentration: Good and Attention Span: Good  Recall:  Good  Fund of Knowledge:  Good  Language:  Good  Akathisia:  Negative  Handed:    AIMS (if indicated):     Assets:  Desire for Improvement  ADL's:  Intact  Cognition:  WNL  Sleep:  Number of Hours: 6     Treatment Plan Summary: Patient is coming off stimulants. Rage and psychosis is gradually resolving. We just adjusted his medications. We would evaluate him further.   Psychiatric: SUD Substance Induced Psychotic Disorder Bipolar Disorder by history  Medical: Seizure disorder Cerebral Palsy HTN  Psychosocial:  Limited support  PLAN: 1. Continue current regimen 2. Encourage unit groups and activities 3. Continue to monitor mood, behavior and interaction with peers 4. Motivational enhancement  5. SW would facilitate aftercare.   Georgiann CockerVincent A Izediuno, MD 09/09/2016, 11:18 AM

## 2016-09-09 NOTE — [facility] Group Notes (Signed)
[facility] Group Notes:  (Nursing/MHT/Case Management/Adjunct)  Date:  09/09/2016  Time:  5:54 PM  Type of Therapy:  Nurse Education  Participation Level:  Did Not Attend  Summary of Progress/Problems:  This was a group focusing on sober living and relapse prevention.  Almira Barenny G Nene Aranas 09/09/2016, 5:54 PM

## 2016-09-09 NOTE — [facility] Group Notes (Signed)
[facility] LCSW Group Therapy Note  09/09/2016  and  10:15 AM  Type of Therapy and Topic:  Group Therapy: Avoiding Self-Sabotaging and Enabling Behaviors  Participation Level:  Minimal  Participation Quality:  Drowsy  Affect:  Flat  Cognitive:  Oriented  Insight:  None shared  Engagement in Therapy:  None noted   Therapeutic models used: Cognitive Behavioral Therapy,  Person-Centered Therapy and Motivational Interviewing  Modes of Intervention:  Discussion, Exploration, Orientation, Rapport Building, Socialization and Support   Summary of Progress/Problems:  The main focus of today's process group was for the patient to identify ways in which they have in the past sabotaged their own recovery. Motivational Interviewing was utilized to identify motivation they may have for wanting to change. Patients were given opportunity to process how their behaviors and/or thought patterns impact their life. Patient identified no patterns as having a negative impact and did not participate in the discussion.    Carney Bernatherine C Breckan Cafiero, LCSW

## 2016-09-09 NOTE — Plan of Care (Signed)
Problem: Activity: Goal: Sleeping patterns will improve Outcome: Progressing  Patient reports sleeping well with current regimen.   Problem: Safety: Goal: Periods of time without injury will increase Outcome: Progressing Patient is on q15 minute safety checks and low fall risk precautions. Patient contracts for safety on the unit and remains safe at this time.   

## 2016-09-09 NOTE — Progress Notes (Signed)
Brian Lucero. [name] has been up and visible in milieu this evening, did attend and participate in evening group activity. He was seen interacting appropriately with peers in milieu, has reported feeling better in regards to his withdrawal, has denied SI and was able to receive all medications without incident. A. Support and encouragement provided. R. Safety maintained, will continue to monitor.

## 2016-09-10 MED ORDER — CLONIDINE HCL 0.1 MG PO TABS
0.1000 mg | ORAL_TABLET | Freq: Once | ORAL | Status: AC
  Administered 2016-09-10: 0.1 mg via ORAL
  Filled 2016-09-10 (×2): qty 1

## 2016-09-10 MED ORDER — AMLODIPINE BESYLATE 5 MG PO TABS
5.0000 mg | ORAL_TABLET | Freq: Every day | ORAL | Status: DC
  Administered 2016-09-10 – 2016-09-11 (×2): 5 mg via ORAL
  Filled 2016-09-10 (×5): qty 1

## 2016-09-10 MED ORDER — ENSURE ENLIVE PO LIQD
237.0000 mL | Freq: Two times a day (BID) | ORAL | Status: DC | PRN
  Administered 2016-09-11: 237 mL via ORAL
  Filled 2016-09-10: qty 237

## 2016-09-10 NOTE — [facility] Group Notes (Signed)
[facility] LCSW Group Therapy  09/10/2016 10:15 AM  Type of Therapy:  Group Therapy  Participation Level:  Active  Participation Quality:  Attentive and Sharing  Affect:  Flat  Cognitive:  Alert and Oriented  Insight:  Improving  Engagement in Therapy:  Improving  Modes of Intervention:  Discussion, Education, Exploration, Rapport Building, Socialization and Support  Summary of Progress/Problems: Topic for today was thoughts and feelings regarding discharge. We discussed fears of upcoming changes including judgements, expectations and stigma of mental health issues. We then discussed supports: what constitutes a supportive framework, identification of supports and what to do when others are not supportive. Patients then identified a specific coping tool to use when others are not available. Patient processed that he truly dosen't need to answer the why questions before beginning change.    Carney Bernatherine C Harrill, LCSW

## 2016-09-10 NOTE — [facility] Suicide Risk Assessment (Addendum)
[facility] INPATIENT:  Family/Significant Other Suicide Prevention Education  Suicide Prevention Education:  Contact Attempts: job counselor Birdie HopesChris Geter [phone]  has been identified by the patient as the family member/significant other with whom the patient will be residing, and identified as the person(s) who will aid the patient in the event of a mental health crisis.  With written consent from the patient, two attempts were made to provide suicide prevention education, prior to and/or following the patient's discharge.  We were unsuccessful in providing suicide prevention education.  A suicide education pamphlet was given to the patient to share with family/significant other.  Date and time of first attempt:   09/10/16 /  3:18pm Date and time of second attempt:  To be done  Brian ChadMareida J Lucero 09/10/2016, 3:19 PM   SPE attempt made at 10:25AM on 09/11/16. No answer. SPE completed with pt; SPI pamphlet and Mobile Crisis information also provided to pt. He denies access to guns/weapons and denies SI/HI at this time.   Trula SladeHeather Smart, MSW, LCSW Clinical Social Worker 09/11/2016 10:27 AM

## 2016-09-10 NOTE — Progress Notes (Signed)
Adult Psychoeducational Group Note  Date:  09/10/2016 Time:  6:09 PM  Group Topic/Focus:  Self Esteem Action Plan:   The focus of this group is to help patients create a plan to continue to build self-esteem after discharge.  Participation Level:  Active  Participation Quality:  Attentive and Intrusive  Affect:  Depressed  Cognitive:  Alert and Oriented  Insight: Lacking  Engagement in Group:  Off Topic  Modes of Intervention:  Clarification, Discussion and Limit-setting  Additional Comments:  Pt. Was quietly disruptive in group.  Pt. Made inappropriate comment about a male peer being "good at swearing" when asked to offer positive comments. Pt. Attitude was sarcastic and belittling of group process.                                              Brian Lucero, Brian Lucero 09/10/2016, 6:09 PM

## 2016-09-10 NOTE — Progress Notes (Signed)
The patient arrived late for group and was appropriate.  

## 2016-09-10 NOTE — Progress Notes (Addendum)
[facility] MD Progress Note  09/10/2016 2:29 PM Brian Lucero  MRN:  [id number] Subjective:   46 yo AAM. 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 Self presented to the ER reports thoughts of violence. Reports auditory hallucination that are derogatory. He has been having thoughts of walking into th traffic. Reports visualization of the word "kill" whenever he closes his eyes. Intoxicated with cocaine and THC. Patient is sleeping in his room. Refused to engaged. Sedated from medications. He is getting benzodiazepines for alcohol withdrawals. BAL was negative and he has not come in with elevated BAL in past hospital visits.  Chart reviewed today. Patient discussed at team  Staff reports that he has been doing well mentally. No longer expressing any perceptual abnormality. Has not been observed to be internally stimulated. Has been socializing appropriately with peers. No behavioral issues. Elevated blood pressure responded to Lisinopril.    Seen today. Notes that he has not had any visual or auditory hallucinations today. Says he is feeling better. Feels he would easily relapse if he goes back to the place he came from. Says he hopes to stay at an inpatient rehab facility this time. Blames the facility that he was discharged to during last his admission. Says they refused him based on old prescriptions of antibiotics. No longer having any thoughts of violence. Says he loves to be around people. We discussed treatment of his hypertension. Says he usually takes 20 mg of Lisinopril and Amlodipine. We have agreed to restart these.    Principal Problem: Substance Induced Mood Disorder Diagnosis:   Patient Active Problem List   Diagnosis Date Noted  . Bipolar 2 disorder, major depressive episode (HCC) [F31.81] 09/07/2016  . 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 Major depressive disorder, recurrent episode, severe, with psychosis (HCC) [F33.3] 06/12/2016  .  Cocaine use disorder, severe, dependence (HCC) [F14.20] 06/12/2016  . Major depressive disorder, recurrent severe without psychotic features (HCC) [F33.2] 06/12/2016  . Seizure (HCC) [R56.9] 11/17/2011  . Tobacco abuse [Z72.0] 11/17/2011  . Alcohol abuse [F10.10] 11/17/2011  . Cocaine abuse [F14.10] 11/17/2011  . Bipolar 2 disorder (HCC) [F31.81] 11/17/2011  . Hypertension [I10] 11/17/2011  . Hydrocephalus [G91.9] 11/17/2011  . Aspiration pneumonia (HCC) [J69.0] 11/17/2011   Total Time spent with patient: 20 minutes  Past Psychiatric History: As in H&P  Past Medical History:  Past Medical History:  Diagnosis Date  . Anxiety   . Bipolar 2 disorder (HCC)   . Depression   . Hypertension   . Pneumothorax    as a baby in the 43's  . Seizures (HCC)    History reviewed. No pertinent surgical history. Family History:  Family History  Problem Relation Age of Onset  . Diabetes type II Mother   . Diabetes type II Unknown    Family Psychiatric  History: . As in H&P Social History:  History  Alcohol Use  . 1.2 oz/week  . 2 Cans of beer per week    Comment: 1 40oz Fri. and Sat.     History  Drug Use  . Types: Cocaine    Comment: Used yesterday    Social History   Social History  . Marital status: Single    Spouse name: N/A  . Number of children: N/A  . Years of education: N/A   Social History Main Topics  . Smoking status: Current Every Day Smoker    Packs/day: 0.20    Types: Cigarettes  . Smokeless tobacco: Never Used  .  Alcohol use 1.2 oz/week    2 Cans of beer per week     Comment: 1 40oz Fri. and Sat.  . Drug use: Yes    Types: Cocaine     Comment: Used yesterday  . Sexual activity: Yes   Other Topics Concern  . None   Social History Narrative  . None   Additional Social History:   Sleep: Good  Appetite:  Good  Current Medications: Current Facility-Administered Medications  Medication Dose Route Frequency Provider Last Rate Last Dose  .  acetaminophen (TYLENOL) tablet 650 mg  650 mg Oral Q4H PRN Laveda AbbeParks, Laurie Britton, NP      . alum & mag hydroxide-simeth (MAALOX/MYLANTA) 200-200-20 MG/5ML suspension 30 mL  30 mL Oral Q6H PRN Laveda AbbeParks, Laurie Britton, NP      . divalproex (DEPAKOTE ER) 24 hr tablet 750 mg  750 mg Oral QHS Laveda AbbeParks, Laurie Britton, NP   750 mg at 09/09/16 2135  . FLUoxetine (PROZAC) capsule 20 mg  20 mg Oral Daily Laveda AbbeParks, Laurie Britton, NP   20 mg at 09/10/16 0955  . gabapentin (NEURONTIN) capsule 200 mg  200 mg Oral BID Laveda AbbeParks, Laurie Britton, NP   200 mg at 09/10/16 0954  . hydrOXYzine (ATARAX/VISTARIL) tablet 25 mg  25 mg Oral Q6H PRN Laveda AbbeParks, Laurie Britton, NP   25 mg at 09/07/16 2239  . ibuprofen (ADVIL,MOTRIN) tablet 600 mg  600 mg Oral Q8H PRN Laveda AbbeParks, Laurie Britton, NP      . loperamide (IMODIUM) capsule 2-4 mg  2-4 mg Oral PRN Laveda AbbeParks, Laurie Britton, NP      . magnesium hydroxide (MILK OF MAGNESIA) suspension 30 mL  30 mL Oral Daily PRN Laveda AbbeParks, Laurie Britton, NP      . multivitamin with minerals tablet 1 tablet  1 tablet Oral Daily Laveda AbbeParks, Laurie Britton, NP   1 tablet at 09/10/16 [phone]  . nicotine (NICODERM CQ - dosed in mg/24 hours) patch 21 mg  21 mg Transdermal Daily Laveda AbbeParks, Laurie Britton, NP   21 mg at 09/10/16 0957  . ondansetron (ZOFRAN-ODT) disintegrating tablet 4 mg  4 mg Oral Q6H PRN Laveda AbbeParks, Laurie Britton, NP      . risperiDONE (RISPERDAL) tablet 0.5 mg  0.5 mg Oral BID Georgiann CockerIzediuno, Vincent A, MD   0.5 mg at 09/10/16 0956  . traZODone (DESYREL) tablet 50 mg  50 mg Oral QHS PRN Laveda AbbeParks, Laurie Britton, NP   50 mg at 09/09/16 2135    Lab Results: No results found for this or any previous visit (from the past 48 hour(s)).  Blood Alcohol level:  Lab Results  Component Value Date   ETH <5 09/07/2016   ETH <5 06/11/2016    Metabolic Disorder Labs: No results found for: HGBA1C, MPG Lab Results  Component Value Date   PROLACTIN 35.6 (H) 06/18/2016   Lab Results  Component Value Date   CHOL 216 (H)  06/18/2016   TRIG 45 06/18/2016   HDL 76 06/18/2016   CHOLHDL 2.8 06/18/2016   VLDL 9 06/18/2016   LDLCALC 131 (H) 06/18/2016   LDLCALC 99 07/15/2008    Physical Findings: AIMS: Facial and Oral Movements Muscles of Facial Expression: None, normal Lips and Perioral Area: None, normal Jaw: None, normal Tongue: None, normal,Extremity Movements Upper (arms, wrists, hands, fingers): None, normal Lower (legs, knees, ankles, toes): None, normal, Trunk Movements Neck, shoulders, hips: None, normal, Overall Severity Severity of abnormal movements (highest score from questions above): None, normal Incapacitation due to abnormal movements: None,  normal Patient's awareness of abnormal movements (rate only patient's report): No Awareness, Dental Status Current problems with teeth and/or dentures?: No Does patient usually wear dentures?: No  CIWA:  CIWA-Ar Total: 2 COWS:     Musculoskeletal: Strength & Muscle Tone: Atrophy and spasticity is at baseline.  Gait & Station: Scissors gait from likely CP Patient leans: As above  Psychiatric Specialty Exam: Physical Exam  Constitutional: He is oriented to person, place, and time. No distress.  HENT:  Head: Normocephalic.  Eyes: Pupils are equal, round, and reactive to light.  Neck: Neck supple.  Cardiovascular: Normal rate.   Respiratory: Effort normal.  Neurological: He is alert and oriented to person, place, and time.  Skin: Skin is warm and dry. He is not diaphoretic.  Psychiatric:  As above    ROS  Blood pressure 120/82, pulse 81, temperature 97.9 F (36.6 C), temperature source Oral, resp. rate 16, height 5\' 4"  (1.626 m), weight 52.2 kg (115 lb).Body mass index is 19.74 kg/m.  General Appearance: Neatly dressed. Pleasant and engages well. Appropriate behavior. Not internally distracted.  Eye Contact:  Good  Speech:  Spontaneous, normal rate tone and volume.   Volume:  Normal  Mood:  Improving  Affect:  Full range and appropriate    Thought Process:  Linear  Orientation:  Full (Time, Place, and Person)  Thought Content:  No thoughts of violence. No delusional theme. No hallucination in any modality.   Suicidal Thoughts:  No  Homicidal Thoughts:  No  Memory:  WNL  Judgement:  Good  Insight:  Good  Psychomotor Activity:  Normal  Concentration:  Concentration: Good and Attention Span: Good  Recall:  Good  Fund of Knowledge:  Good  Language:  Good  Akathisia:  Negative  Handed:    AIMS (if indicated):     Assets:  Desire for Improvement  ADL's:  Intact  Cognition:  WNL  Sleep:  Number of Hours: 6     Treatment Plan Summary: Psychosis and rage is gradually resolving. He is more in touch with reality. He is externalizing reasons for relapse. Wants inpatient rehab. We would explore that and hopefully discharge him early this week.   Psychiatric: SUD Substance Induced Psychotic Disorder Bipolar Disorder by history  Medical: Seizure disorder Cerebral Palsy HTN  Psychosocial:  Limited support  PLAN: 1. Amlodipine 5 mg daily 2. Continue to monitor mood, behavior and interaction with peers.  3. SW would facilitate aftercare.   Georgiann Cocker, MD 09/10/2016, 2:29 PMPatient ID: Brian Lucero, male   DOB: Feb 13, 1971, 46 y.o.   MRN: [id number]

## 2016-09-10 NOTE — Progress Notes (Addendum)
Patient ID: Brian GaribaldiRandall Lucero, male   DOB: 07/09/1970, 46 y.o.   MRN: [id number] D) Pt. Affect and mood pleasant this am, but became more irritable and sarcastic as shift progressed.  Pt. Was silly and sarcastic, and mildly disruptive in a self esteem group this afternoon. Pt. Was asked to come to get medications just prior to joining peers in line for dinner.  Pt. Was told staff would walk down with him, but pt. Refused to go stating "you waited until it was time, to go, now I'm not going".  Pt. Was reminded dinner had just started and that peers were just getting to cafeteria.  Pt. Was offered another opportunity, and pt. Scowled and stated "You like to tell people what to do". Pt. Declined  filling out self-inventory. A) Medications given per MD order. Offered support and availability throughout shift. Pt. Was brought a tray and drink and assisted in setting it up. Pt. Encouraged to express needs.  R) Pt. Remains safe on unit.

## 2016-09-10 NOTE — Progress Notes (Signed)
Pt's blood pressure 182/111 and 161/104 this morning.  Looking back at past vital signs it appears to stay high.  NP notified and one time order received for Clonidne 0.1mg  with MD to address issue on day shift as far as if Pt needs to be on blood pressure medication.  Pt states he takes Lisinopril 10 mg, has also taken HCTZ.

## 2016-09-10 NOTE — Progress Notes (Signed)
Patient did not attend the evening speaker AA meeting. Pt was notified that group was beginning but returned to room.  

## 2016-09-11 MED ORDER — AMLODIPINE BESYLATE 10 MG PO TABS: 10.0000 mg | ORAL_TABLET | Freq: Every day | ORAL | 0 refills | Status: DC

## 2016-09-11 MED ORDER — DIVALPROEX SODIUM ER 250 MG PO TB24: 750.0000 mg | ORAL_TABLET | Freq: Every day | ORAL | 0 refills | Status: AC

## 2016-09-11 MED ORDER — RISPERIDONE 0.5 MG PO TABS: 0.5000 mg | ORAL_TABLET | Freq: Two times a day (BID) | ORAL | 0 refills | Status: DC

## 2016-09-11 MED ORDER — DIVALPROEX SODIUM ER 250 MG PO TB24
750.0000 mg | ORAL_TABLET | Freq: Every day | ORAL | Status: DC
  Filled 2016-09-11: qty 42

## 2016-09-11 MED ORDER — NICOTINE 21 MG/24HR TD PT24: 21.0000 mg | MEDICATED_PATCH | Freq: Every day | TRANSDERMAL | 0 refills | Status: DC

## 2016-09-11 MED ORDER — TRAZODONE HCL 50 MG PO TABS: ORAL_TABLET | ORAL | 0 refills | Status: DC

## 2016-09-11 MED ORDER — HYDROXYZINE HCL 25 MG PO TABS: 25.0000 mg | ORAL_TABLET | Freq: Four times a day (QID) | ORAL | 0 refills | Status: DC | PRN

## 2016-09-11 MED ORDER — GABAPENTIN 100 MG PO CAPS: 200.0000 mg | ORAL_CAPSULE | Freq: Two times a day (BID) | ORAL | 0 refills | Status: DC

## 2016-09-11 MED ORDER — FLUOXETINE HCL 20 MG PO CAPS: 20.0000 mg | ORAL_CAPSULE | Freq: Every day | ORAL | 0 refills | Status: DC

## 2016-09-11 MED ORDER — AMLODIPINE BESYLATE 10 MG PO TABS
10.0000 mg | ORAL_TABLET | Freq: Every day | ORAL | Status: DC
  Administered 2016-09-12: 10 mg via ORAL
  Filled 2016-09-11: qty 14
  Filled 2016-09-11 (×2): qty 1
  Filled 2016-09-11: qty 14

## 2016-09-11 NOTE — Tx Team (Signed)
Interdisciplinary Treatment and Diagnostic Plan Update  09/11/2016 Time of Session: 0930AM Brian Lucero MRN: [id number]  Principal Diagnosis: Bipolar Disorder  Secondary Diagnoses: Active Problems:   Bipolar 2 disorder, major depressive episode (HCC)   Current Medications:  Current Facility-Administered Medications  Medication Dose Route Frequency Provider Last Rate Last Dose  . acetaminophen (TYLENOL) tablet 650 mg  650 mg Oral Q4H PRN Laveda Abbe, NP      . alum & mag hydroxide-simeth (MAALOX/MYLANTA) 200-200-20 MG/5ML suspension 30 mL  30 mL Oral Q6H PRN Laveda Abbe, NP      . amLODipine (NORVASC) tablet 5 mg  5 mg Oral Daily Izediuno, Delight Ovens, MD   5 mg at 09/11/16 0959  . divalproex (DEPAKOTE ER) 24 hr tablet 750 mg  750 mg Oral QHS Laveda Abbe, NP   750 mg at 09/10/16 2207  . feeding supplement (ENSURE ENLIVE) (ENSURE ENLIVE) liquid 237 mL  237 mL Oral BID BM PRN Oneta Rack, NP      . FLUoxetine (PROZAC) capsule 20 mg  20 mg Oral Daily Laveda Abbe, NP   20 mg at 09/11/16 0959  . gabapentin (NEURONTIN) capsule 200 mg  200 mg Oral BID Laveda Abbe, NP   200 mg at 09/11/16 7829  . hydrOXYzine (ATARAX/VISTARIL) tablet 25 mg  25 mg Oral Q6H PRN Laveda Abbe, NP   25 mg at 09/07/16 2239  . ibuprofen (ADVIL,MOTRIN) tablet 600 mg  600 mg Oral Q8H PRN Laveda Abbe, NP      . magnesium hydroxide (MILK OF MAGNESIA) suspension 30 mL  30 mL Oral Daily PRN Laveda Abbe, NP      . multivitamin with minerals tablet 1 tablet  1 tablet Oral Daily Laveda Abbe, NP   1 tablet at 09/11/16 [phone]  . nicotine (NICODERM CQ - dosed in mg/24 hours) patch 21 mg  21 mg Transdermal Daily Laveda Abbe, NP   21 mg at 09/11/16 1000  . risperiDONE (RISPERDAL) tablet 0.5 mg  0.5 mg Oral BID Izediuno, Delight Ovens, MD   0.5 mg at 09/11/16 0959  . traZODone (DESYREL) tablet 50 mg  50 mg Oral QHS PRN Laveda Abbe, NP    50 mg at 09/10/16 2207   PTA Medications: Prescriptions Prior to Admission  Medication Sig Dispense Refill Last Dose  . albuterol (PROVENTIL HFA;VENTOLIN HFA) 108 (90 Base) MCG/ACT inhaler Inhale 2 puffs into the lungs every 4 (four) hours. For shortness of breath   prn  . amLODipine (NORVASC) 10 MG tablet Take 1 tablet (10 mg total) by mouth daily. (Patient not taking: Reported on 09/07/2016) 30 tablet 0 Not Taking at Unknown time  . diphenhydrAMINE (BENADRYL) 25 MG tablet Take 1 tablet (25 mg total) by mouth every 6 (six) hours as needed for itching. (Patient not taking: Reported on 09/07/2016) 20 tablet 0 Not Taking at Unknown time  . divalproex (DEPAKOTE ER) 250 MG 24 hr tablet Take 3 tablets (750 mg total) by mouth at bedtime. 30 tablet 0 09/05/2016  . FLUoxetine (PROZAC) 20 MG capsule Take 1 capsule (20 mg total) by mouth daily. For depression 30 capsule 0 09/05/2016  . gabapentin (NEURONTIN) 100 MG capsule Take 2 capsules (200 mg total) by mouth 2 (two) times daily. For agitation 120 capsule 0 09/05/2016  . guaiFENesin (MUCINEX) 600 MG 12 hr tablet Take 2 tablets (1,200 mg total) by mouth 2 (two) times daily as needed for cough or to loosen  phlegm. (Patient not taking: Reported on 09/07/2016) 10 tablet 0 Not Taking at Unknown time  . hydrochlorothiazide (HYDRODIURIL) 25 MG tablet Take 1 tablet (25 mg total) by mouth daily. (Patient not taking: Reported on 09/07/2016) 30 tablet 0 Not Taking at Unknown time  . hydrochlorothiazide (HYDRODIURIL) 25 MG tablet Take 1 tablet (25 mg total) by mouth daily. For high blood pressure 30 tablet 0 09/05/2016  . lisinopril (PRINIVIL,ZESTRIL) 20 MG tablet Take 1 tablet (20 mg total) by mouth daily. For high blood pressure (Patient not taking: Reported on 09/07/2016) 30 tablet 0 Not Taking at Unknown time  . nicotine (NICODERM CQ - DOSED IN MG/24 HOURS) 21 mg/24hr patch Place 1 patch (21 mg total) onto the skin daily. For smoking cessation (Patient not taking: Reported on  09/07/2016) 28 patch 0 Not Taking at Unknown time  . ranitidine (ZANTAC) 150 MG capsule Take 1 capsule (150 mg total) by mouth 2 (two) times daily. (Patient not taking: Reported on 09/07/2016) 10 capsule 0 Not Taking at Unknown time  . risperiDONE (RISPERDAL) 0.5 MG tablet Take 1 tablet (0.5 mg total) by mouth every morning. 30 tablet 0 09/05/2016  . risperiDONE (RISPERDAL) 1 MG tablet Take 1 tablet (1 mg total) by mouth at bedtime. (Patient not taking: Reported on 09/07/2016) 30 tablet 0 Not Taking at Unknown time  . traZODone (DESYREL) 50 MG tablet Take 1 tablet (50 mg) at bedtime: For sleep 30 tablet 0 09/05/2016    Patient Stressors: Financial difficulties Marital or family conflict Medication change or noncompliance Occupational concerns Substance abuse  Patient Strengths: Average or above average Radio producer for treatment/growth Supportive family/friends  Treatment Modalities: Medication Management, Group therapy, Case management,  1 to 1 session with clinician, Psychoeducation, Recreational therapy.   Physician Treatment Plan for Primary Diagnosis: Bipolar Disorder Long Term Goal(s): Improvement in symptoms so as ready for discharge Improvement in symptoms so as ready for discharge   Short Term Goals: Ability to identify changes in lifestyle to reduce recurrence of condition will improve Ability to verbalize feelings will improve Ability to disclose and discuss suicidal ideas Ability to demonstrate self-control will improve Ability to identify and develop effective coping behaviors will improve Ability to maintain clinical measurements within normal limits will improve Compliance with prescribed medications will improve Ability to identify triggers associated with substance abuse/mental health issues will improve Ability to identify changes in lifestyle to reduce recurrence of condition will improve Ability to verbalize feelings will improve Ability  to disclose and discuss suicidal ideas Ability to demonstrate self-control will improve Ability to identify and develop effective coping behaviors will improve Ability to maintain clinical measurements within normal limits will improve Compliance with prescribed medications will improve Ability to identify triggers associated with substance abuse/mental health issues will improve  Medication Management: Evaluate patient's response, side effects, and tolerance of medication regimen.  Therapeutic Interventions: 1 to 1 sessions, Unit Group sessions and Medication administration.  Evaluation of Outcomes: Adequate for discharge   Physician Treatment Plan for Secondary Diagnosis: Active Problems:   Bipolar 2 disorder, major depressive episode (HCC)  Long Term Goal(s): Improvement in symptoms so as ready for discharge Improvement in symptoms so as ready for discharge   Short Term Goals: Ability to identify changes in lifestyle to reduce recurrence of condition will improve Ability to verbalize feelings will improve Ability to disclose and discuss suicidal ideas Ability to demonstrate self-control will improve Ability to identify and develop effective coping behaviors will improve Ability to maintain clinical  measurements within normal limits will improve Compliance with prescribed medications will improve Ability to identify triggers associated with substance abuse/mental health issues will improve Ability to identify changes in lifestyle to reduce recurrence of condition will improve Ability to verbalize feelings will improve Ability to disclose and discuss suicidal ideas Ability to demonstrate self-control will improve Ability to identify and develop effective coping behaviors will improve Ability to maintain clinical measurements within normal limits will improve Compliance with prescribed medications will improve Ability to identify triggers associated with substance abuse/mental  health issues will improve     Medication Management: Evaluate patient's response, side effects, and tolerance of medication regimen.  Therapeutic Interventions: 1 to 1 sessions, Unit Group sessions and Medication administration.  Evaluation of Outcomes: Adequate for discharge   RN Treatment Plan for Primary Diagnosis: Bipolar Disorder Long Term Goal(s): Knowledge of disease and therapeutic regimen to maintain health will improve  Short Term Goals: Ability to remain free from injury will improve, Ability to verbalize feelings will improve and Ability to disclose and discuss suicidal ideas  Medication Management: RN will administer medications as ordered by provider, will assess and evaluate patient's response and provide education to patient for prescribed medication. RN will report any adverse and/or side effects to prescribing provider.  Therapeutic Interventions: 1 on 1 counseling sessions, Psychoeducation, Medication administration, Evaluate responses to treatment, Monitor vital signs and CBGs as ordered, Perform/monitor CIWA, COWS, AIMS and Fall Risk screenings as ordered, Perform wound care treatments as ordered.  Evaluation of Outcomes: Adequate for discharge   LCSW Treatment Plan for Primary Diagnosis: Bipolar Disorder Long Term Goal(s): Safe transition to appropriate next level of care at discharge, Engage patient in therapeutic group addressing interpersonal concerns.  Short Term Goals: Engage patient in aftercare planning with referrals and resources, Facilitate patient progression through stages of change regarding substance use diagnoses and concerns and Identify triggers associated with mental health/substance abuse issues  Therapeutic Interventions: Assess for all discharge needs, 1 to 1 time with Social worker, Explore available resources and support systems, Assess for adequacy in community support network, Educate family and significant other(s) on suicide prevention,  Complete Psychosocial Assessment, Interpersonal group therapy.  Evaluation of Outcomes: Adequate for discharge   Progress in Treatment: Attending groups: Yes Participating in groups: disruptive at times; minimal participation  Taking medication as prescribed: Yes. Toleration medication: Yes. Family/Significant other contact made: Contact attempts made with pt's job counselor. SPE completed with pt.  Patient understands diagnosis: Yes. Discussing patient identified problems/goals with staff: Yes. Medical problems stabilized or resolved: Yes. Denies suicidal/homicidal ideation: Yes. Issues/concerns per patient self-inventory: No. Other: n/a   New problem(s) identified: No, Describe:  n/a  New Short Term/Long Term Goal(s): detox, medication stabilization; mood stabilization; development of comprehensive mental wellness/sobriety plan.   Discharge Plan or Barriers: Screening at [facility] on Tuesday at 7:45AM. [facility] for outpatient services. [facility] and AA/NA list for [place] provided to pt.   Reason for Continuation of Hospitalization: none  Estimated Length of Stay:  Tuesday, 09-12-16 at 7AM via taxi--[facility] screening scheduled.   Attendees: Patient: 09/11/2016 10:27 AM  Physician: Dr. Jackquline Berlin MD 09/11/2016 10:27 AM  Nursing: Gaspar Bidding RN 09/11/2016 10:27 AM  RN Care Manager: Onnie Boer CM 09/11/2016 10:27 AM  Social Worker: [name], LCSW 09/11/2016 10:27 AM  Recreational Therapist: x 09/11/2016 10:27 AM  Other: Armandina Stammer NP; Denzil Magnuson NP 09/11/2016 10:27 AM  Other:  09/11/2016 10:27 AM  Other: 09/11/2016 10:27 AM    Scribe for Treatment Team:  Pulte HomesHeather N Smart, LCSW 09/11/2016 10:27 AM

## 2016-09-11 NOTE — Progress Notes (Signed)
D: Pt did not get up for morning medications when due, saying he was going to sleep in. When the [facility] asked him later, at approximately 0950, pt was irritable and rude. "I'm trying not to be around other people so I don't go off. When I'm sleeping, don't wake me up." Pt was compliant with medications. He endorsed SI. He said he had not felt irritable at breakfast. "I don't know why I began feeling so irritable." On his self inventory form, pt reported good sleep, good appetite, normal energy level, and poor concentration. He rated his depression 3/10, hopelessness 4/10, and anxiety 4/10.   A: Meds given as ordered. Q15 safety checks maintained. Support/encouragement offered.  R: Pt remains free from harm and continues with treatment. Will continue to monitor for needs/safety.

## 2016-09-11 NOTE — Discharge Summary (Signed)
Physician Discharge Summary Note  Patient:  Brian Lucero is an 46 y.o., male MRN:  [id number] DOB:  June 21, 1970 Patient phone:  [phone] (home)  Patient address:   [address] [place] [address],  Total Time spent with patient: Greater than 30 minutes  Date of Admission:  09/07/2016 Date of Discharge: 09-11-16  Reason for Admission: Auditory hallucinations, thoughts of violence & suicidal ideations with plans to workout in front of traffic.  Principal Problem: Bipolar affective disorder, Substance induced psychosis, Cocaine use disorder.  Discharge Diagnoses: Patient Active Problem List   Diagnosis Date Noted  . Bipolar 2 disorder, major depressive episode (HCC) [F31.81] 09/07/2016  . 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 Major depressive disorder, recurrent episode, severe, with psychosis (HCC) [F33.3] 06/12/2016  . Cocaine use disorder, severe, dependence (HCC) [F14.20] 06/12/2016  . Major depressive disorder, recurrent severe without psychotic features (HCC) [F33.2] 06/12/2016  . Seizure (HCC) [R56.9] 11/17/2011  . Tobacco abuse [Z72.0] 11/17/2011  . Alcohol abuse [F10.10] 11/17/2011  . Cocaine abuse [F14.10] 11/17/2011  . Bipolar 2 disorder (HCC) [F31.81] 11/17/2011  . Hypertension [I10] 11/17/2011  . Hydrocephalus [G91.9] 11/17/2011  . Aspiration pneumonia (HCC) [J69.0] 11/17/2011   Past Psychiatric History: Bipolar, Cocaine use disorder.  Past Medical History:  Past Medical History:  Diagnosis Date  . Anxiety   . Bipolar 2 disorder (HCC)   . Depression   . Hypertension   . Pneumothorax    as a baby in the 35's  . Seizures (HCC)    History reviewed. No pertinent surgical history.  Family History:  Family History  Problem Relation Age of Onset  . Diabetes type II Mother   . Diabetes type II Unknown    Family Psychiatric  History: See H&P  Social History:  History  Alcohol Use  . 1.2 oz/week  . 2  Cans of beer per week    Comment: 1 40oz Fri. and Sat.     History  Drug Use  . Types: Cocaine    Comment: Used yesterday    Social History   Social History  . Marital status: Single    Spouse name: N/A  . Number of children: N/A  . Years of education: N/A   Social History Main Topics  . Smoking status: Current Every Day Smoker    Packs/day: 0.20    Types: Cigarettes  . Smokeless tobacco: Never Used  . Alcohol use 1.2 oz/week    2 Cans of beer per week     Comment: 1 40oz Fri. and Sat.  . Drug use: Yes    Types: Cocaine     Comment: Used yesterday  . Sexual activity: Yes   Other Topics Concern  . None   Social History Narrative  . None   Hospital Course: (Per admission note): 46yo AAM. 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 Self presented to the ER reports thoughts of violence. Reports auditory hallucination that are derogatory. He has been having thoughts of walking into th traffic. Reports visualization of the word "kill" whenever he closes his eyes. Intoxicated with cocaine and THC. Patient is sleeping in his room. Refused to engaged. Sedated from medications. He is getting benzodiazepines for alcohol withdrawals. BAL was negative and he has not come in with elevated BAL in past hospital visits.  After the above admission notes, Brian Lucero was started on medication regimen for his presenting symptoms. He received a brief Ativan detox protocols. He also received & was discharged on; Depakote  750 mg for mood stabilization, Fluoxetine 20 mg for depression, Gabapentin 200 mg for agitation/substance withdrawal symptoms, Hydroxyzine 25 mg prn for anxiety, Nicotine patch 25 mg prn for smoking cessation & Risperdal 0.5 mg bid for anxiety. He was enrolled & participated in the group counseling sessions being offered & held on this unit. He presented other significant health issues that requires treatment & or monitoring. He was resumed on all his pertinent home medications for  those health issues. He tolerated his treatment regimen without any adverse effects or reactions reported.  At his discharge assessment/interview today with his attending psychiatrist, Brian Lucero reports feeling much better. He states being in the hospital has helped him a lot. He says he feels he is back to his old self. No thoughts of suicide. Says he plans to stay sober to help maintain a stable mental health status.  No thoughts of homicide.  No thoughts of violence. No access to weapons. He reports that he is in good spirits. Not feeling depressed. Reports normal energy and interest. Has been maintaining normal biological functions. He is able to think clearly. He is able to focus on task. His thoughts are not crowded or racing. No evidence of mania. No hallucinations. He is not making any delusional statement. He is fully in touch with reality. No overwhelming anxiety. No craving for substances.   Brian Lucero's case was discussed at the treatment team meeting today. The nursing staff notes that he has been bright today. He has not been observed to be internally disturbed or pre-occupied. He has not voiced any suicidal thoughts today. Patient has been cooperative with care and has tolerated his medications. Team members feels that patient is back to his baseline level of function. Team agrees with plan to discharge patient today to continue further substance abuse treatment at [facility] [place], [place]. He is provided with a 14 days worth, supply samples of his [facility] discharge medications. [facility] personal belongings in no apparent distress. Transportation per taxi. [facility] assisted with taxi voucher..  Physical Findings: AIMS: Facial and Oral Movements Muscles of Facial Expression: None, normal Lips and Perioral Area: None, normal Jaw: None, normal Tongue: None, normal,Extremity Movements Upper (arms, wrists, hands, fingers): None, normal Lower (legs, knees,  ankles, toes): None, normal, Trunk Movements Neck, shoulders, hips: None, normal, Overall Severity Severity of abnormal movements (highest score from questions above): None, normal Incapacitation due to abnormal movements: None, normal Patient's awareness of abnormal movements (rate only patient's report): No Awareness, Dental Status Current problems with teeth and/or dentures?: No Does patient usually wear dentures?: No  CIWA:  CIWA-Ar Total: 0 COWS:     Musculoskeletal: Strength & Muscle Tone: within normal limits Gait & Station: normal Patient leans: N/A  Psychiatric Specialty Exam: Physical Exam  ROS  Blood pressure (!) 154/103, pulse 97, temperature 98.2 F (36.8 C), temperature source Oral, resp. rate 20, height 5\' 4"  (1.626 m), weight 52.2 kg (115 lb).Body mass index is 19.74 kg/m.  See Md's SRA   Have you used any form of tobacco in the last 30 days? (Cigarettes, Smokeless Tobacco, Cigars, and/or Pipes): Yes  Has this patient used any form of tobacco in the last 30 days? (Cigarettes, Smokeless Tobacco, Cigars, and/or Pipes): Yes, provided with Nicotine patch prescription upon discharge for smoking cessation.  Blood Alcohol level:  Lab Results  Component Value Date   [facility] <5 09/07/2016   ETH <5 06/11/2016   Metabolic Disorder Labs:  No results found for: HGBA1C, MPG Lab Results  Component Value Date   PROLACTIN 35.6 (H) 06/18/2016   Lab Results  Component Value Date   CHOL 216 (H) 06/18/2016   TRIG 45 06/18/2016   HDL 76 06/18/2016   CHOLHDL 2.8 06/18/2016   VLDL 9 06/18/2016   LDLCALC 131 (H) 06/18/2016   LDLCALC 99 07/15/2008   See Psychiatric Specialty Exam and Suicide Risk Assessment completed by Attending Physician prior to discharge.  Discharge destination:  [facility]  Is patient on multiple antipsychotic therapies at discharge:  No   Has Patient had three or more failed trials of antipsychotic monotherapy by history:  No  Recommended Plan  for Multiple Antipsychotic Therapies: NA  Allergies as of 09/11/2016      Reactions   Levofloxacin Swelling   Lisinopril Swelling      Medication List    STOP taking these medications   albuterol 108 (90 Base) MCG/ACT inhaler Commonly known as:  PROVENTIL HFA;VENTOLIN HFA   diphenhydrAMINE 25 MG tablet Commonly known as:  BENADRYL   guaiFENesin 600 MG 12 hr tablet Commonly known as:  MUCINEX   hydrochlorothiazide 25 MG tablet Commonly known as:  HYDRODIURIL   lisinopril 20 MG tablet Commonly known as:  PRINIVIL,ZESTRIL   ranitidine 150 MG capsule Commonly known as:  ZANTAC     TAKE these medications     Indication  amLODipine 10 MG tablet Commonly known as:  NORVASC Take 1 tablet (10 mg total) by mouth daily. For high blood pressure Start taking on:  09/12/2016 What changed:  additional instructions  Indication:  High Blood Pressure Disorder   divalproex 250 MG 24 hr tablet Commonly known as:  DEPAKOTE ER Take 3 tablets (750 mg total) by mouth at bedtime. For mood stabilization What changed:  additional instructions  Indication:  Mood stabilization   FLUoxetine 20 MG capsule Commonly known as:  PROZAC Take 1 capsule (20 mg total) by mouth daily. For depression Start taking on:  09/12/2016  Indication:  Major Depressive Disorder   gabapentin 100 MG capsule Commonly known as:  NEURONTIN Take 2 capsules (200 mg total) by mouth 2 (two) times daily. For agitation  Indication:  Agitation   hydrOXYzine 25 MG tablet Commonly known as:  ATARAX/VISTARIL Take 1 tablet (25 mg total) by mouth every 6 (six) hours as needed for anxiety.  Indication:  Anxiety Neurosis   nicotine 21 mg/24hr patch Commonly known as:  NICODERM CQ - dosed in mg/24 hours Place 1 patch (21 mg total) onto the skin daily. For smoking cessation  Indication:  Nicotine Addiction   risperiDONE 0.5 MG tablet Commonly known as:  RISPERDAL Take 1 tablet (0.5 mg total) by mouth 2 (two) times daily.  For mood control What changed:  when to take this  additional instructions  Another medication with the same name was removed. Continue taking this medication, and follow the directions you see here.  Indication:  Mood control   traZODone 50 MG tablet Commonly known as:  DESYREL Take 1 tablet (50 mg) at bedtime: For sleep  Indication:  Trouble Sleeping      Follow-up Information    [facility] Follow up.   Specialty:  Behavioral Health Why:  Walk in within 7 days of hospital discharge to be assessed for mental health services including: medication management; counseling; substance abuse support groups. Walk in hours: Mon-Fri 8am-9am. Thank you.  Contact information: [address] [place] [address] [phone]  Services, [facility] Follow up on 09/12/2016.   Why:  Please arrive by 7:45AM if you are interested in screening for possible admission. Please bring: photo ID/proof of [place] residency, 14 day medication supply/30 day prescriptions provided to you by hospital, and clothing. Thank you.  Contact information: [address] [place] [address] [phone]          Follow-up recommendations: Activity:  As tolerated Diet: As recommended by your primary care doctor. Keep all scheduled follow-up appointments as recommended.   Comments: Patient is instructed prior to discharge to: Take all medications as prescribed by his/her mental healthcare provider. Report any adverse effects and or reactions from the medicines to his/her outpatient provider promptly. Patient has been instructed & cautioned: To not engage in alcohol and or illegal drug use while on prescription medicines. In the event of worsening symptoms, patient is instructed to call the crisis hotline, 911 and or go to the nearest ED for appropriate evaluation and treatment of symptoms. To follow-up with his/her primary care provider for your other medical issues, concerns and or  health care needs.   Signed: Sanjuana Kava, NP, PMHNP, FNP-BC 09/11/2016, 3:16 PM

## 2016-09-11 NOTE — Progress Notes (Signed)
D   Pt reports feeling nervous about discharge tomorrow    He is hopeful he will be accepted into the program tomorrow because he got turned down the last time he tried to get in the program A   Verbal support given   Medications administered and effectiveness monitored   Q 15 min checks R    Pt is safe at present time and receptive to verbal support

## 2016-09-11 NOTE — Progress Notes (Signed)
Recreation Therapy Notes  Date: 09/11/16 Time: 0930 Location: 300 Hall Dayroom  Group Topic: Stress Management  Goal Area(s) Addresses:  Patient will verbalize importance of using healthy stress management.  Patient will identify positive emotions associated with healthy stress management.   Intervention: Stress Management  Activity :  Guided Imagery.  LRT introduced the stress management technique of guided imagery.  LRT read a script that allowed patients to take a mental vacation through a [place] meadow.  Patients were to follow along as script was read to engage in the activity.  Education:  Stress Management, Discharge Planning.   Education Outcome: Acknowledges edcuation/In group clarification offered/Needs additional education  Clinical Observations/Feedback: Pt did not attend group.   Brian RancherMarjette Camree Lucero, LRT/CTRS         Lillia AbedLindsay, Akshay Spang A 09/11/2016 1:20 PM

## 2016-09-11 NOTE — Plan of Care (Signed)
Problem: Education: Goal: Emotional status will improve Outcome: Not Progressing Pt remains irritable.    Problem: Safety: Goal: Periods of time without injury will increase Outcome: Adequate for Discharge Pt is safe on the unit with rounding and 15-minute checks in place.

## 2016-09-11 NOTE — [facility] Suicide Risk Assessment (Signed)
[facility] Discharge Suicide Risk Assessment   Principal Problem: Substance Induced Psychotic Disorder  Discharge Diagnoses:  Patient Active Problem List   Diagnosis Date Noted  . Bipolar 2 disorder, major depressive episode (HCC) [F31.81] 09/07/2016  . 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 Major depressive disorder, recurrent episode, severe, with psychosis (HCC) [F33.3] 06/12/2016  . Cocaine use disorder, severe, dependence (HCC) [F14.20] 06/12/2016  . Major depressive disorder, recurrent severe without psychotic features (HCC) [F33.2] 06/12/2016  . Seizure (HCC) [R56.9] 11/17/2011  . Tobacco abuse [Z72.0] 11/17/2011  . Alcohol abuse [F10.10] 11/17/2011  . Cocaine abuse [F14.10] 11/17/2011  . Bipolar 2 disorder (HCC) [F31.81] 11/17/2011  . Hypertension [I10] 11/17/2011  . Hydrocephalus [G91.9] 11/17/2011  . Aspiration pneumonia (HCC) [J69.0] 11/17/2011    Total Time spent with patient: 45 minutes  Musculoskeletal: Strength & Muscle Tone: spastic Gait & Station: scissors gait Patient leans: N/A  Psychiatric Specialty Exam: Review of Systems  Constitutional: Negative.   HENT: Negative.   Eyes: Negative.   Respiratory: Negative.   Cardiovascular: Negative.   Gastrointestinal: Negative.   Genitourinary: Negative.   Musculoskeletal: Negative.   Skin: Negative.   Neurological: Negative.   Endo/Heme/Allergies: Negative.   Psychiatric/Behavioral: Negative for depression, hallucinations, memory loss and suicidal ideas. The patient is not nervous/anxious and does not have insomnia.     Blood pressure (!) 154/103, pulse 97, temperature 98.2 F (36.8 C), temperature source Oral, resp. rate 20, height 5\' 4"  (1.626 m), weight 52.2 kg (115 lb).Body mass index is 19.74 kg/m.  General Appearance: Neatly dressed, pleasant, engaging well and cooperative. Appropriate behavior. Not in any distress. Good relatedness. Not internally stimulated  Eye  Contact::  Good  Speech: Spontaneous, normal prosody. Normal tone and rate.   Volume:  Normal  Mood:  Euthymic  Affect:  Appropriate and Full Range  Thought Process:  Goal Directed and Linear  Orientation:  Full (Time, Place, and Person)  Thought Content:  No delusional theme. No preoccupation with violent thoughts. No negative ruminations. No obsession.  No hallucination in any modality.   Suicidal Thoughts:  No  Homicidal Thoughts:  No  Memory:  Immediate;   Good Recent;   Good Remote;   Good  Judgement:  Good  Insight:  Good  Psychomotor Activity:  Normal  Concentration:  Good  Recall:  Good  Fund of Knowledge:Good  Language: Good  Akathisia:  Negative  Handed:    AIMS (if indicated):     Assets:  Communication Skills Desire for Improvement Resilience  Sleep:  Number of Hours: 5.75  Cognition: WNL  ADL's:  Intact   Clinical Assessment::   46yo AAM. 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 Self presented to the ER reports thoughts of violence. Reports auditory hallucination that are derogatory. He has been having thoughts of walking into th traffic. Reports visualization of the word "kill" whenever he closes his eyes. Intoxicated with cocaine and THC. Patient is sleeping in his room. Refused to engaged. Sedated from medications. He is getting benzodiazepines for alcohol withdrawals. BAL was negative and he has not come in with elevated BAL in past hospital visits.  Nursing staff reports that patient has been appropriate on the unit. Patient has been interacting well with peers. No behavioral issues. Patient has not voiced any suicidal thoughts. Patient has not been observed to be internally stimulated. Patient has been adherent with treatment recommendations. Patient has been tolerating their medication well.  Patient was discussed at team. Team members feels  that patient is back to his baseline level of function. Team agrees with plan to discharge patient  today.    Demographic Factors:  Male  Loss Factors: NA  Historical Factors: Impulsivity  Risk Reduction Factors:   Positive therapeutic relationship and Positive coping skills or problem solving skills  Continued Clinical Symptoms:   as above   Cognitive Features That Contribute To Risk:  None    Suicide Risk:  Minimal: No identifiable suicidal ideation.  Patient is not having any thoughts of suicide at this time. Modifiable risk factors targeted during this admission includes substance use and related mood disorder. Demographical and historical risk factors cannot be modified. Patient is now engaging well. Patient is reliable and is future oriented. We have buffered patient's support structures. At this point, patient is at low risk of suicide. Patient is aware of the effects of psychoactive substances on decision making process. Patient has been provided with emergency contacts. Patient acknowledges to use resources provided if unforseen circumstances changes their current risk stratification.   Follow-up Information    [facility] Follow up.   Specialty:  Behavioral Health Why:  Walk in within 7 days of hospital discharge to be assessed for mental health services including: medication management; counseling; substance abuse support groups. Walk in hours: Mon-Fri 8am-9am. Thank you.  Contact information: [address] [place] [address] [phone]        [facility], [facility] Follow up on 09/12/2016.   Why:  Please arrive by 7:45AM if you are interested in screening for possible admission. Please bring: photo ID/proof of [place] residency, 14 day medication supply/30 day prescriptions provided to you by hospital, and clothing. Thank you.  Contact information: [address] [place] [address] [phone]           Plan Of Care/Follow-up recommendations:  1. Continue current psychotropic medications 2. Mental health and addiction follow up as  arranged.  3. Provided limited quantity of prescriptions   Georgiann CockerVincent A Landrie Beale, MD 09/11/2016, 3:05 PM

## 2016-09-11 NOTE — Progress Notes (Signed)
  [facility] Adult Case Management Discharge Plan :  Will you be returning to the same living situation after discharge:  No-[facility] at discharge for screening.  At discharge, do you have transportation home?: Yes,  taxi voucher in chart. PATIENT MUST DISCHARGE NO LATER THAN 7AM IN ORDER TO GET TO [facility] FOR SCREENING ON TUES. 09/12/16 Do you have the ability to pay for your medications: Yes,  [place]  Release of information consent forms completed and submitted to medical records by CSW.  Patient to Follow up at: Follow-up Information    [facility] Follow up.   Specialty:  Behavioral Health Why:  Walk in within 7 days of hospital discharge to be assessed for mental health services including: medication management; counseling; substance abuse support groups. Walk in hours: Mon-Fri 8am-9am. Thank you.  Contact information: [address] [place] [address] [phone]        [facility], [facility] Follow up on 09/12/2016.   Why:  Please arrive by 7:45AM if you are interested in screening for possible admission. Please bring: photo ID/proof of [place] residency, 14 day medication supply/30 day prescriptions provided to you by hospital, and clothing. Thank you.  Contact information: [address] [place] [address] [phone]           Next level of care provider has access to [facility] Link:no  Safety Planning and Suicide Prevention discussed: Yes,  SPE completed with pt; contact attempts made with pt's job counselor.  Have you used any form of tobacco in the last 30 days? (Cigarettes, Smokeless Tobacco, Cigars, and/or Pipes): Yes  Has patient been referred to the Quitline?: Patient refused referral  Patient has been referred for addiction treatment: Yes  Job Holtsclaw N Smart LCSW 09/11/2016, 10:30 AM

## 2016-09-11 NOTE — Progress Notes (Signed)
[name]  09/11/2016 12:23 PM Brian GaribaldiRandall Mazariego  MRN:  [id number] Subjective:   46 yo AAM. 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 Self presented to the ER reports thoughts of violence. Reports auditory hallucination that are derogatory. He has been having thoughts of walking into th traffic. Reports visualization of the word "kill" whenever he closes his eyes. Intoxicated with cocaine and THC. Patient is sleeping in his room. Refused to engaged. Sedated from medications. He is getting benzodiazepines for alcohol withdrawals. BAL was negative and he has not come in with elevated BAL in past hospital visits.  Chart reviewed today. Patient discussed at team  Staff reports that he was a bit irritable today while staff was getting him to activate this morning. Patient has been appropriate since then. He has not been observed to be internally stimulated. He has been taking his medications as prescribed. SW reports that [facility].     Seen today. Patient apologized for his behavior. Says he did not mean to be rude. Tells me that he has been feeling well. He has not had any hallucinations since the last review. He has not had any suicidal thoughts. No thoughts of violence. Says he is able to think clearly again. No side effects from his medication. BP is still elevated but no symptoms reported. We have agreed to increase Amlodipine further. Patient is open to discharge tomorrow. Wants to explore backup plan if [facility].     Principal Problem: Substance Induced Mood Disorder Diagnosis:   Patient Active Problem List   Diagnosis Date Noted  . Bipolar 2 disorder, major depressive episode (HCC) [F31.81] 09/07/2016  . 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 Major depressive disorder, recurrent episode, severe, with psychosis (HCC) [F33.3] 06/12/2016  . Cocaine use disorder, severe, dependence (HCC) [F14.20]  06/12/2016  . Major depressive disorder, recurrent severe without psychotic features (HCC) [F33.2] 06/12/2016  . Seizure (HCC) [R56.9] 11/17/2011  . Tobacco abuse [Z72.0] 11/17/2011  . Alcohol abuse [F10.10] 11/17/2011  . Cocaine abuse [F14.10] 11/17/2011  . Bipolar 2 disorder (HCC) [F31.81] 11/17/2011  . Hypertension [I10] 11/17/2011  . Hydrocephalus [G91.9] 11/17/2011  . Aspiration pneumonia (HCC) [J69.0] 11/17/2011   Total Time spent with patient: 20 minutes  Past Psychiatric History: As in H&P  Past Medical History:  Past Medical History:  Diagnosis Date  . Anxiety   . Bipolar 2 disorder (HCC)   . Depression   . Hypertension   . Pneumothorax    as a baby in the 1470's  . Seizures (HCC)    History reviewed. No pertinent surgical history. Family History:  Family History  Problem Relation Age of Onset  . Diabetes type II Mother   . Diabetes type II Unknown    Family Psychiatric  History: . As in H&P Social History:  History  Alcohol Use  . 1.2 oz/week  . 2 Cans of beer per week    Comment: 1 40oz Fri. and Sat.     History  Drug Use  . Types: Cocaine    Comment: Used yesterday    Social History   Social History  . Marital status: Single    Spouse name: N/A  . Number of children: N/A  . Years of education: N/A   Social History Main Topics  . Smoking status: Current Every Day Smoker    Packs/day: 0.20    Types: Cigarettes  . Smokeless tobacco: Never Used  .  Alcohol use 1.2 oz/week    2 Cans of beer per week     Comment: 1 40oz Fri. and Sat.  . Drug use: Yes    Types: Cocaine     Comment: Used yesterday  . Sexual activity: Yes   Other Topics Concern  . None   Social History Narrative  . None   Additional Social History:   Sleep: Good  Appetite:  Good  Current Medications: Current Facility-Administered Medications  Medication Dose Route Frequency Provider Last Rate Last Dose  . acetaminophen (TYLENOL) tablet 650 mg  650 mg Oral Q4H PRN Laveda Abbe, NP      . alum & mag hydroxide-simeth (MAALOX/MYLANTA) 200-200-20 MG/5ML suspension 30 mL  30 mL Oral Q6H PRN Laveda Abbe, NP      . amLODipine (NORVASC) tablet 5 mg  5 mg Oral Daily Miciah Covelli, Delight Ovens, MD   5 mg at 09/11/16 0959  . divalproex (DEPAKOTE ER) 24 hr tablet 750 mg  750 mg Oral QHS Laveda Abbe, NP   750 mg at 09/10/16 2207  . feeding supplement (ENSURE ENLIVE) (ENSURE ENLIVE) liquid 237 mL  237 mL Oral BID BM PRN Oneta Rack, NP      . FLUoxetine (PROZAC) capsule 20 mg  20 mg Oral Daily Laveda Abbe, NP   20 mg at 09/11/16 0959  . gabapentin (NEURONTIN) capsule 200 mg  200 mg Oral BID Laveda Abbe, NP   200 mg at 09/11/16 1610  . hydrOXYzine (ATARAX/VISTARIL) tablet 25 mg  25 mg Oral Q6H PRN Laveda Abbe, NP   25 mg at 09/07/16 2239  . ibuprofen (ADVIL,MOTRIN) tablet 600 mg  600 mg Oral Q8H PRN Laveda Abbe, NP      . magnesium hydroxide (MILK OF MAGNESIA) suspension 30 mL  30 mL Oral Daily PRN Laveda Abbe, NP      . multivitamin with minerals tablet 1 tablet  1 tablet Oral Daily Laveda Abbe, NP   1 tablet at 09/11/16 [phone]  . nicotine (NICODERM CQ - dosed in mg/24 hours) patch 21 mg  21 mg Transdermal Daily Laveda Abbe, NP   21 mg at 09/11/16 1000  . risperiDONE (RISPERDAL) tablet 0.5 mg  0.5 mg Oral BID Alizandra Loh, Delight Ovens, MD   0.5 mg at 09/11/16 0959  . traZODone (DESYREL) tablet 50 mg  50 mg Oral QHS PRN Laveda Abbe, NP   50 mg at 09/10/16 2207    Lab Results: No results found for this or any previous visit (from the past 48 hour(s)).  Blood Alcohol level:  Lab Results  Component Value Date   ETH <5 09/07/2016   ETH <5 06/11/2016    Metabolic Disorder Labs: No results found for: HGBA1C, MPG Lab Results  Component Value Date   PROLACTIN 35.6 (H) 06/18/2016   Lab Results  Component Value Date   CHOL 216 (H) 06/18/2016   TRIG 45 06/18/2016   HDL 76  06/18/2016   CHOLHDL 2.8 06/18/2016   VLDL 9 06/18/2016   LDLCALC 131 (H) 06/18/2016   LDLCALC 99 07/15/2008    Physical Findings: AIMS: Facial and Oral Movements Muscles of Facial Expression: None, normal Lips and Perioral Area: None, normal Jaw: None, normal Tongue: None, normal,Extremity Movements Upper (arms, wrists, hands, fingers): None, normal Lower (legs, knees, ankles, toes): None, normal, Trunk Movements Neck, shoulders, hips: None, normal, Overall Severity Severity of abnormal movements (highest score from questions above): None,  normal Incapacitation due to abnormal movements: None, normal Patient's awareness of abnormal movements (rate only patient's report): No Awareness, Dental Status Current problems with teeth and/or dentures?: No Does patient usually wear dentures?: No  CIWA:  CIWA-Ar Total: 2 COWS:     Musculoskeletal: Strength & Muscle Tone: Atrophy and spasticity is at baseline.  Gait & Station: Scissors gait from likely CP Patient leans: As above  Psychiatric Specialty Exam: Physical Exam  Constitutional: He is oriented to person, place, and time. He appears well-developed and well-nourished.  HENT:  Head: Normocephalic.  Eyes: Pupils are equal, round, and reactive to light.  Neck: Neck supple.  Cardiovascular: Normal rate.   Respiratory: Effort normal.  Neurological: He is alert and oriented to person, place, and time.  Skin: Skin is warm and dry.  Psychiatric:  As above    ROS  Blood pressure (!) 168/100, pulse 97, temperature 98.2 F (36.8 C), temperature source Oral, resp. rate 20, height 5\' 4"  (1.626 m), weight 52.2 kg (115 lb).Body mass index is 19.74 kg/m.  General Appearance: Neatly dressed. Calm and cooperative. Good relatedness.  Appropriate behavior. Not internally distracted.  Eye Contact:  Good  Speech:  Spontaneous, normal rate tone and volume.   Volume:  Normal  Mood:  Euthymic  Affect:  Full range and appropriate   Thought  Process:  Linear  Orientation:  Full (Time, Place, and Person)  Thought Content:  No delusional theme. No preoccupation with violent thoughts. No negative ruminations. No obsession.  No hallucination in any modality.   Suicidal Thoughts:  No  Homicidal Thoughts:  No  Memory:  WNL  Judgement:  Good  Insight:  Good  Psychomotor Activity:  Normal  Concentration:  Concentration: Good and Attention Span: Good  Recall:  Good  Fund of Knowledge:  Good  Language:  Good  Akathisia:  Negative  Handed:    AIMS (if indicated):     Assets:  Desire for Improvement  ADL's:  Intact  Cognition:  WNL  Sleep:  Number of Hours: 5.75     Treatment Plan Summary: Psychosis and rage has resolved.  He not a danger to self or others. He has agreed to [facility] referral tomorrow. Hopeful discharge tomorrow.   Psychiatric: SUD Substance Induced Psychotic Disorder Bipolar Disorder by history  Medical: Seizure disorder Cerebral Palsy HTN  Psychosocial:  Limited support  PLAN: 1. Increase Amlodipine to 10mg  daily 2. Continue to monitor mood, behavior and interaction with peers.  3. SW would facilitate aftercare  Georgiann Cocker, MD 09/11/2016, 12:23 PMPatient ID: Brian Lucero, male   DOB: 01-Aug-1970, 46 y.o.   MRN: [id number] Patient ID: Dejuan Elman, male   DOB: 1970-04-28, 46 y.o.   MRN: [id number]

## 2016-09-11 NOTE — Progress Notes (Signed)
Brian Lucero. Brian Lucero had been in room during group time, chose not to attend. Brian Lucero did appear to be interacting appropriately later with peers in milieu. He spoke that his day was ok, did not offer any complaints, received medications without incident and retired to his room shortly before lights out around 11pm. A. Support and encouragement provided. R. Safety maintained, will continue to monitor.

## 2016-09-11 NOTE — Social Work (Signed)
Referred to [facility], is [place] resident.  Anne Cunningham, LCSW Lead Clinical Social Worker Phone:  [phone]  

## 2016-09-11 NOTE — Progress Notes (Signed)
Pt would not get up for a.m meds. He said he wanted to sleep.

## 2016-09-11 NOTE — [facility] Group Notes (Signed)
[facility] LCSW Group Therapy  09/11/2016 11:16 AM  Type of Therapy:  Group Therapy  Participation Level:  Active  Participation Quality:  Attentive  Affect:  Appropriate  Cognitive:  Oriented  Insight:  Improving  Engagement in Therapy:  Improving  Modes of Intervention:  Confrontation, Discussion, Education, Problem-solving, Socialization and Support  Summary of Progress/Problems: Today's Topic: Overcoming Obstacles. Patients identified one short term goal and potential obstacles in reaching this goal. Patients processed barriers involved in overcoming these obstacles. Patients identified steps necessary for overcoming these obstacles and explored motivation (internal and external) for facing these difficulties head on. Brynda GreathouseRandall was attentive and engaged during today's processing group. He shared that [facility] and is worried about being turned away. CSW reviewed criteria for admission with patient and needed items, which Brynda GreathouseRandall stated was reassuring. Brynda GreathouseRandall continues to show progress in the group setting with improving insight.   Zylen Wenig N Smart LCSW 09/11/2016, 11:16 AM

## 2016-09-12 NOTE — Progress Notes (Signed)
Pt was discharged to go for an interview [facility]   He received all follow up information,prescriptions ,medication supply ,teaching sheets and cab voucher  He denies suicidal and homicidal ideation   Mood affect and behavior were appropriate and stable    He was discharged to the lobby with breakfast and all of his belongings waiting for a cab to be here around 7 am

## 2016-10-13 ENCOUNTER — Encounter: Payer: Self-pay | Admitting: Pediatric Intensive Care

## 2016-10-16 ENCOUNTER — Encounter: Payer: Self-pay | Admitting: Pediatric Intensive Care

## 2016-10-31 ENCOUNTER — Encounter: Payer: Self-pay | Admitting: Pediatric Intensive Care

## 2016-11-03 ENCOUNTER — Encounter: Payer: Self-pay | Admitting: Pediatric Intensive Care

## 2016-11-09 NOTE — Congregational Nurse Program (Signed)
Congregational Nurse Program Note  Date of Encounter: 10/30/2016  Past Medical History: Past Medical History:  Diagnosis Date  . Anxiety   . Bipolar 2 disorder (HCC)   . Depression   . Hypertension   . Pneumothorax    as a baby in the 2670's  . Seizures [facility](HCC)     Encounter Details:     CNP Questionnaire - 10/30/16 1855      Patient Demographics   Is this a new or existing patient? New   Patient is considered a/an Not Applicable   Race African-American/Black     Patient Assistance   Location of Patient Assistance Not Applicable   Patient's financial/insurance status Low Income;Medicaid   Uninsured Patient ([employer] officer, trade unionCard/Care Connects) No   Patient referred to apply for the following financial assistance Not Applicable   Food insecurities addressed Not Applicable   Transportation assistance No   Assistance securing medications No   Educational health offerings Hypertension     Encounter Details   Primary purpose of visit Chronic Illness/Condition Visit;Safety   Was an Emergency Department visit averted? Yes   Does patient have a medical provider? Yes   Patient referred to Establish PCP   Was a mental health screening completed? (GAINS tool) No   Does patient have dental issues? No   Does patient have vision issues? No   Was a vision referral made? No   Does your patient have an abnormal blood pressure today? Yes   Since previous encounter, have you referred patient for abnormal blood pressure that resulted in a new diagnosis or medication change? No   Does your patient have an abnormal blood glucose today? No   Since previous encounter, have you referred patient for abnormal blood glucose that resulted in a new diagnosis or medication change? No   Was there a life-saving intervention made? No     Client requested B/P to be taken and he needed assistance with applying his Clonidine patch.  Client states he received the patch over a week ago but did not know how to apply  it.  Directed client in how to apply the patch.  Client was able to correctly demonstrate application of patch.  Instructed to remove that patch and apply the next patch in seven days in a different location. Client expressed understanding.  Current B/P is 220/123.  Denies any symptoms.  Discussed s/s of stroke and gave instructions to call 911 in the event of any stroke like symptoms.  Client agreed

## 2016-11-16 NOTE — Congregational Nurse Program (Signed)
Congregational Nurse Program Note  Date of Encounter: 10/13/2016  Past Medical History: Past Medical History:  Diagnosis Date  . Anxiety   . Bipolar 2 disorder (HCC)   . Depression   . Hypertension   . Pneumothorax    as a baby in the 4170's  . Seizures [facility](HCC)     Encounter Details:     CNP Questionnaire - 10/30/16 1855      Patient Demographics   Is this a new or existing patient? New   Patient is considered a/an Not Applicable   Race African-American/Black     Patient Assistance   Location of Patient Assistance Not Applicable   Patient's financial/insurance status Low Income;Medicaid   Uninsured Patient ([employer] officer, trade unionCard/Care Connects) No   Patient referred to apply for the following financial assistance Not Applicable   Food insecurities addressed Not Applicable   Transportation assistance No   Assistance securing medications No   Educational health offerings Hypertension     Encounter Details   Primary purpose of visit Chronic Illness/Condition Visit;Safety   Was an Emergency Department visit averted? Yes   Does patient have a medical provider? Yes   Patient referred to Establish PCP   Was a mental health screening completed? (GAINS tool) No   Does patient have dental issues? No   Does patient have vision issues? No   Was a vision referral made? No   Does your patient have an abnormal blood pressure today? Yes   Since previous encounter, have you referred patient for abnormal blood pressure that resulted in a new diagnosis or medication change? No   Does your patient have an abnormal blood glucose today? No   Since previous encounter, have you referred patient for abnormal blood glucose that resulted in a new diagnosis or medication change? No   Was there a life-saving intervention made? No     New client- [facility]. [facility] and gets his medication from [facility]. CN advised ED  due to increased BP but client wants to see his PCP. CN assisted client- client will see PCP. Bus passes given.

## 2016-11-16 NOTE — Congregational Nurse Program (Signed)
Congregational Nurse Program Note  Date of Encounter: 10/16/2016  Past Medical History: Past Medical History:  Diagnosis Date  . Anxiety   . Bipolar 2 disorder (HCC)   . Depression   . Hypertension   . Pneumothorax    as a baby in the 6270's  . Seizures [facility](HCC)     Encounter Details:     CNP Questionnaire - 10/30/16 1855      Patient Demographics   Is this a new or existing patient? New   Patient is considered a/an Not Applicable   Race African-American/Black     Patient Assistance   Location of Patient Assistance Not Applicable   Patient's financial/insurance status Low Income;Medicaid   Uninsured Patient ([employer] officer, trade unionCard/Care Connects) No   Patient referred to apply for the following financial assistance Not Applicable   Food insecurities addressed Not Applicable   Transportation assistance No   Assistance securing medications No   Educational health offerings Hypertension     Encounter Details   Primary purpose of visit Chronic Illness/Condition Visit;Safety   Was an Emergency Department visit averted? Yes   Does patient have a medical provider? Yes   Patient referred to Establish PCP   Was a mental health screening completed? (GAINS tool) No   Does patient have dental issues? No   Does patient have vision issues? No   Was a vision referral made? No   Does your patient have an abnormal blood pressure today? Yes   Since previous encounter, have you referred patient for abnormal blood pressure that resulted in a new diagnosis or medication change? No   Does your patient have an abnormal blood glucose today? No   Since previous encounter, have you referred patient for abnormal blood glucose that resulted in a new diagnosis or medication change? No   Was there a life-saving intervention made? No    Post- PCP BP check. Client now has medication. Will re-check BP as needed.

## 2016-11-20 NOTE — Congregational Nurse Program (Signed)
Congregational Nurse Program Note  Date of Encounter: 10/31/2016  Past Medical History: Past Medical History:  Diagnosis Date  . Anxiety   . Bipolar 2 disorder (HCC)   . Depression   . Hypertension   . Pneumothorax    as a baby in the 61's  . Seizures (HCC)     Encounter Details:     CNP Questionnaire - 10/31/16 0830      Patient Demographics   Is this a new or existing patient? Existing   Patient is considered a/an Not Applicable   Race African-American/Black     Patient Assistance   Location of Patient Assistance [facility]   Patient's financial/insurance status Low Income;Medicaid   Uninsured Patient ([employer] officer, trade union) No   Patient referred to apply for the following financial assistance Not Applicable   Food insecurities addressed Not Applicable   Transportation assistance Yes   Type of Assistance Bus Pass Given   Assistance securing medications No   Educational health offerings Hypertension     Encounter Details   Primary purpose of visit Chronic Illness/Condition Visit   Was an Emergency Department visit averted? Yes   Does patient have a medical provider? Yes   Patient referred to Follow up with established PCP   Was a mental health screening completed? (GAINS tool) No   Does patient have dental issues? No   Does patient have vision issues? No   Was a vision referral made? No   Does your patient have an abnormal blood pressure today? Yes   Since previous encounter, have you referred patient for abnormal blood pressure that resulted in a new diagnosis or medication change? Yes   Does your patient have an abnormal blood glucose today? No   Since previous encounter, have you referred patient for abnormal blood glucose that resulted in a new diagnosis or medication change? No   Was there a life-saving intervention made? No     Follow up BP check. Client now has medication and is wearing a clonidine patch. CN reviewed medication with client and suggested  taking BP medication in AM rather than at night. Bus passes for follow up appointment at [facility].

## 2016-11-20 NOTE — Congregational Nurse Program (Signed)
Congregational Nurse Program Note  Date of Encounter: 11/03/2016  Past Medical History: Past Medical History:  Diagnosis Date  . Anxiety   . Bipolar 2 disorder (HCC)   . Depression   . Hypertension   . Pneumothorax    as a baby in the 31's  . Seizures (HCC)     Encounter Details:     CNP Questionnaire - 11/20/16 1224      Patient Demographics   Is this a new or existing patient? Existing   Patient is considered a/an Not Applicable   Race African-American/Black     Patient Assistance   Location of Patient Assistance [facility]   Patient's financial/insurance status Low Income;Medicaid   Uninsured Patient ([employer] officer, trade union) No   Patient referred to apply for the following financial assistance Not Applicable   Food insecurities addressed Not Applicable   Transportation assistance No   Type of Assistance Other   Assistance securing medications No   Educational health offerings Safety;Chronic disease     Encounter Details   Primary purpose of visit Education/Health Concerns   Was an Emergency Department visit averted? Not Applicable   Does patient have a medical provider? Yes   Patient referred to Clinic   Was a mental health screening completed? (GAINS tool) No   Does patient have dental issues? No   Does patient have vision issues? No   Was a vision referral made? No   Does your patient have an abnormal blood pressure today? Yes   Since previous encounter, have you referred patient for abnormal blood pressure that resulted in a new diagnosis or medication change? No   Does your patient have an abnormal blood glucose today? No   Since previous encounter, have you referred patient for abnormal blood glucose that resulted in a new diagnosis or medication change? No   Was there a life-saving intervention made? No     Client did not take BP medication this morning. CN advised going ED or to [facility] clinic for eval. Reviewed stroke signs/symtoms.

## 2018-02-09 IMAGING — CR DG CHEST 2V
2 series · 2 of 2 positions shown · non-contrast
Comparison: Chest x-ray of June 11, 2016

CLINICAL DATA: Follow-up of left-sided pneumonia.

EXAM:
CHEST  2 VIEW

[w chest pa]
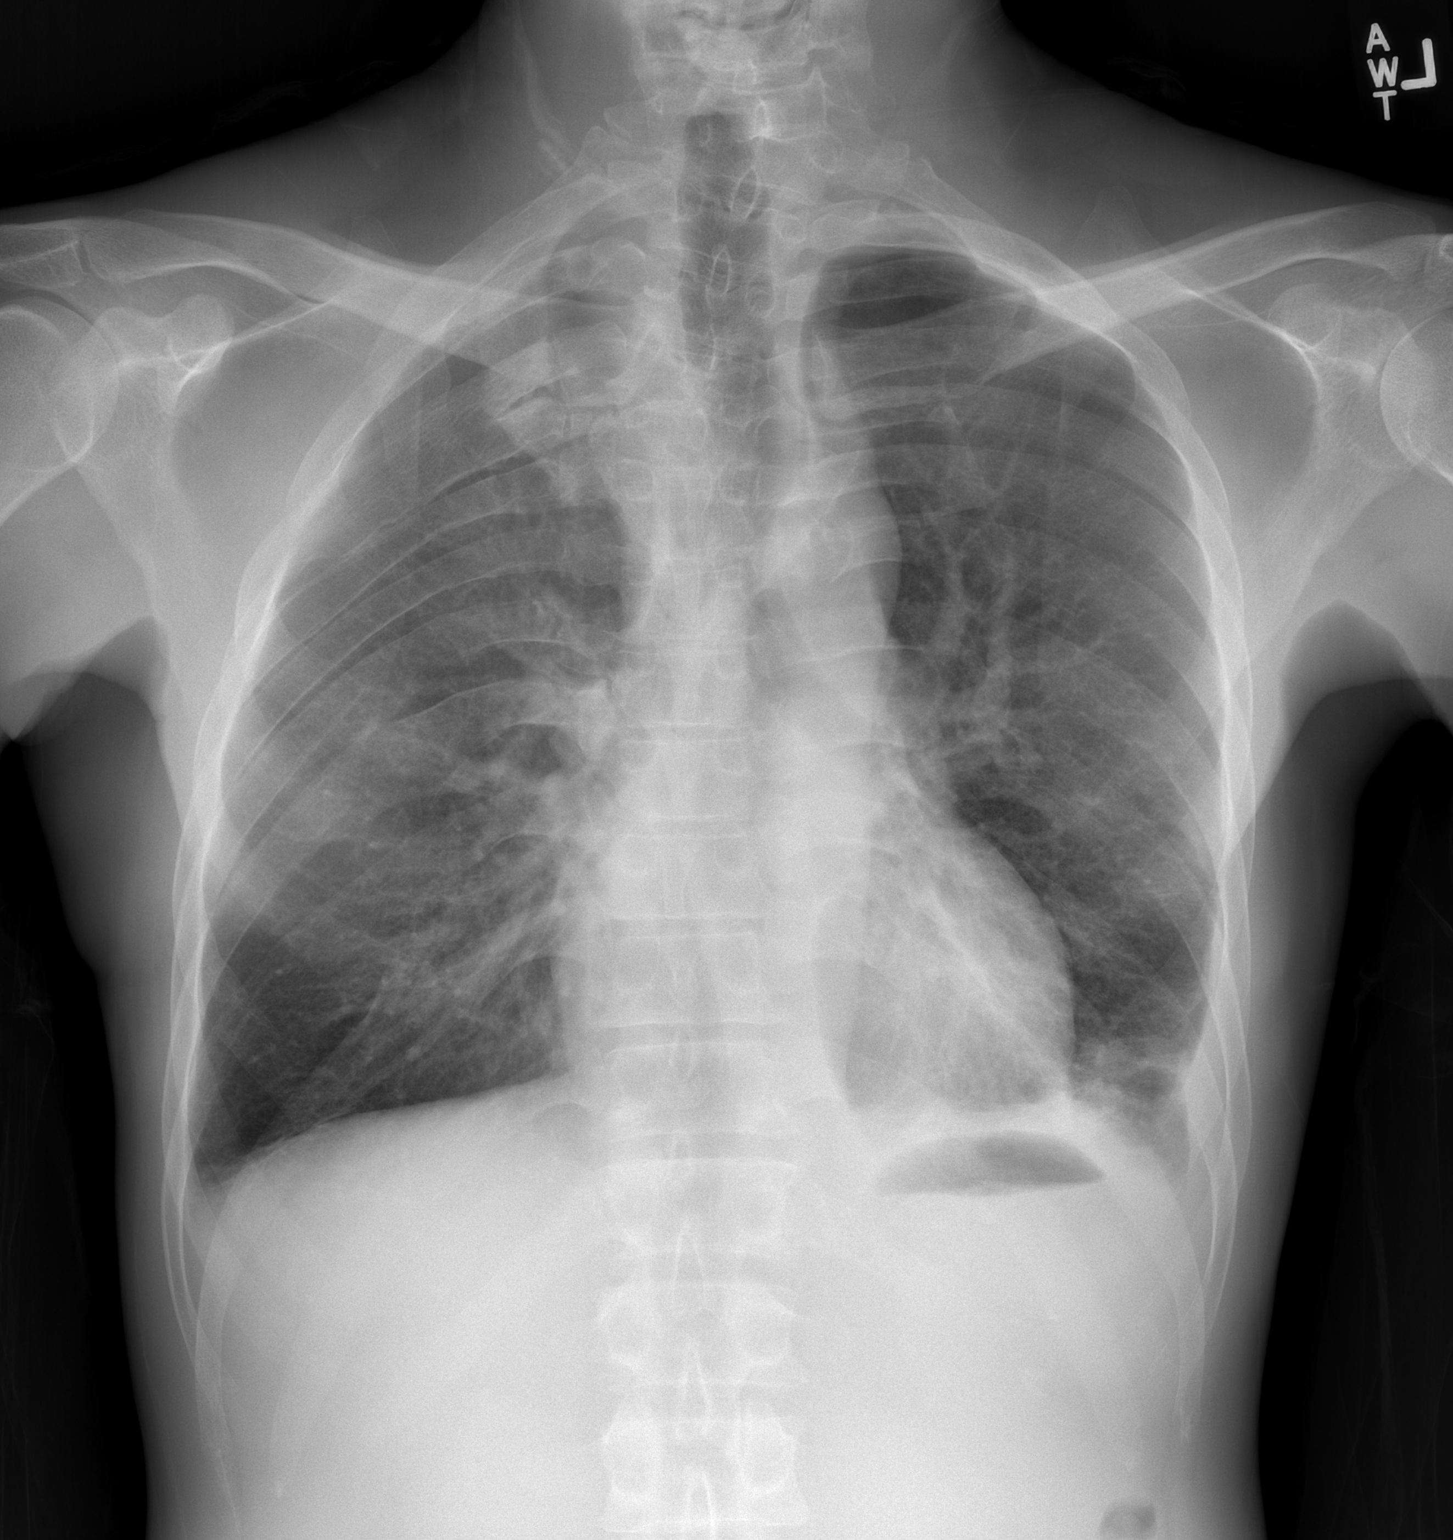

[w chest lat]
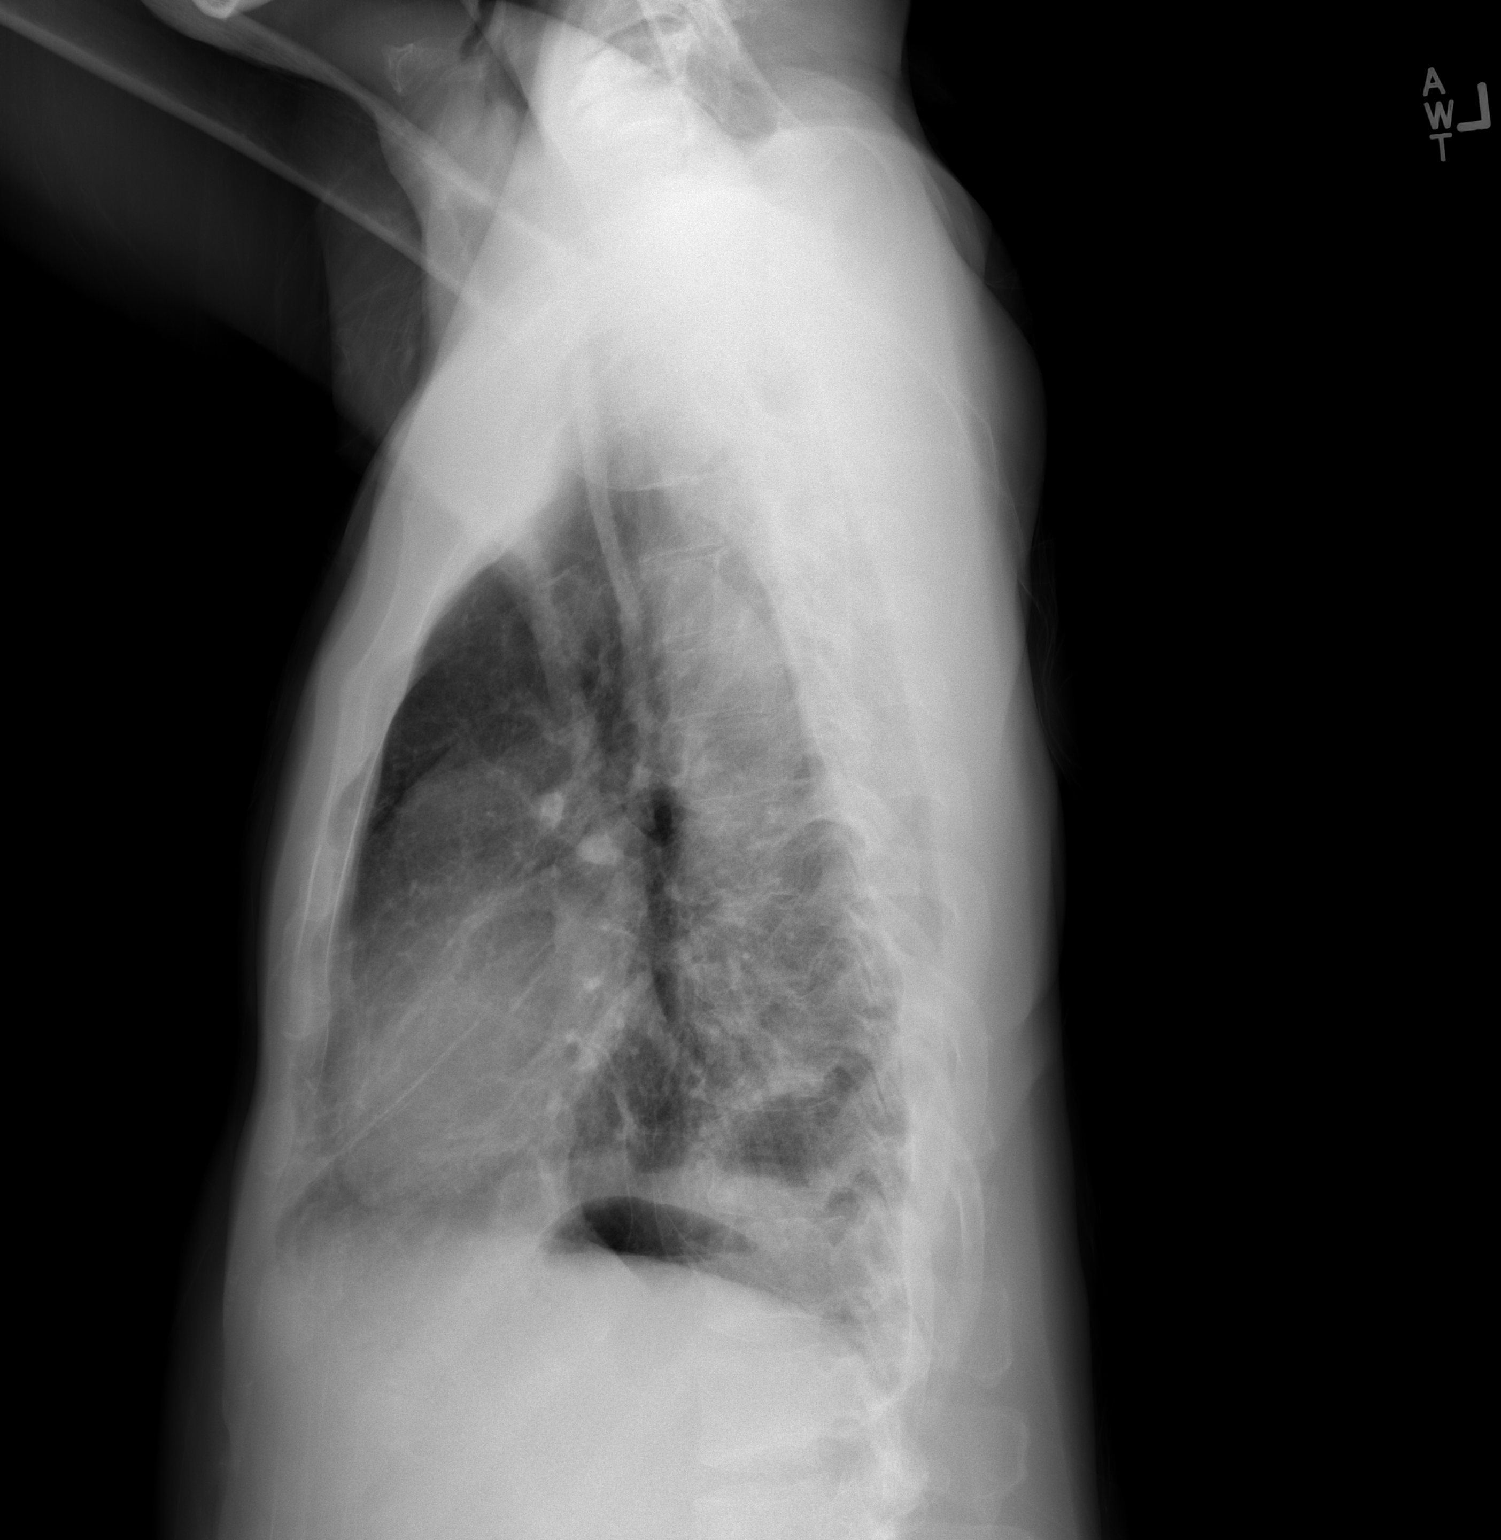

[2 of 2 positions shown; findings below may reference images not displayed]

FINDINGS: The lungs are well-expanded. There is persistent infiltrate at the
left lung base. There is a new small left pleural effusion. No
definite infiltrate is observed elsewhere. The heart is normal in
size. The central pulmonary vascularity is prominent. There is no
definite interstitial edema. There is calcification in the wall of
the aortic arch. The mediastinum is normal in width. The bony thorax
exhibits no acute abnormality.
IMPRESSION: Underlying COPD or reactive airway disease. Persistent pneumonia in
the left lower lobe. New small left pleural effusion. An additional
follow-up chest x-ray in 2-3 weeks is recommended to assure
clearing.

Thoracic aortic atherosclerosis.

## 2019-12-22 ENCOUNTER — Other Ambulatory Visit: Payer: Self-pay

## 2019-12-22 ENCOUNTER — Emergency Department
Admission: EM | Admit: 2019-12-22 | Discharge: 2019-12-22 | Disposition: A | Discharge status: Home / Self Care | Payer: Medicare Other | Attending: Emergency Medicine | Admitting: Emergency Medicine

## 2019-12-22 ENCOUNTER — Encounter: Payer: Self-pay | Admitting: Emergency Medicine

## 2019-12-22 ENCOUNTER — Emergency Department: Payer: Medicare Other

## 2019-12-22 DIAGNOSIS — I10 Essential (primary) hypertension: Secondary | ICD-10-CM | POA: Insufficient documentation

## 2019-12-22 DIAGNOSIS — N50812 Left testicular pain: Secondary | ICD-10-CM | POA: Diagnosis present

## 2019-12-22 DIAGNOSIS — N433 Hydrocele, unspecified: Secondary | ICD-10-CM | POA: Diagnosis not present

## 2019-12-22 DIAGNOSIS — N5082 Scrotal pain: Secondary | ICD-10-CM

## 2019-12-22 DIAGNOSIS — N3000 Acute cystitis without hematuria: Secondary | ICD-10-CM

## 2019-12-22 DIAGNOSIS — F17210 Nicotine dependence, cigarettes, uncomplicated: Secondary | ICD-10-CM | POA: Insufficient documentation

## 2019-12-22 LAB — URINALYSIS, COMPLETE (UACMP) WITH MICROSCOPIC
Bilirubin Urine: NEGATIVE
Glucose, UA: NEGATIVE mg/dL
Hgb urine dipstick: NEGATIVE
Ketones, ur: NEGATIVE mg/dL
Nitrite: POSITIVE — AB
Protein, ur: 30 mg/dL — AB
Specific Gravity, Urine: 1.025 (ref 1.005–1.030)
WBC, UA: >50 WBC/hpf — ABNORMAL HIGH (ref 0–5)
pH: 7 (ref 5.0–8.0)

## 2019-12-22 MED ORDER — CEPHALEXIN 500 MG PO CAPS: 500.0000 mg | ORAL_CAPSULE | Freq: Two times a day (BID) | ORAL | 0 refills | Status: AC

## 2019-12-22 NOTE — ED Notes (Signed)
Pt presents to the ED for R testicle pain. Pt states it started 4 days ago. Denies urinary symptoms or NVD. Pt is A&Ox4 and NAD

## 2019-12-22 NOTE — ED Provider Notes (Addendum)
[facility] Emergency Department Provider Note   ____________________________________________   First MD Initiated Contact with Patient 12/22/19 1931     (approximate)  I have reviewed the triage vital signs and the nursing notes.   HISTORY  Chief Complaint Testicle Pain    HPI Brian Lucero is a 49 y.o. male with past medical history of hypertension, stroke, seizures, and bipolar disorder who presents to the ED complaining of testicle pain.  Patient reports that he has been dealing with aching pain in his left testicle and that the area has been very sore to touch.  He has noticed his increasing swelling along with the pain over the past 4 days, but he denies any dysuria, hematuria, or penile discharge.  He has never had similar issues in the past, is not currently sexually active.        Past Medical History:  Diagnosis Date  . Anxiety   . Bipolar 2 disorder (HCC)   . Depression   . Hypertension   . Pneumothorax    as a baby in the 75's  . Seizures [facility])     Patient Active Problem List   Diagnosis Date Noted  . Bipolar 2 disorder, major depressive episode (HCC) 09/07/2016  . 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 Major depressive disorder, recurrent episode, severe, with psychosis (HCC) 06/12/2016  . Cocaine use disorder, severe, dependence (HCC) 06/12/2016  . Major depressive disorder, recurrent severe without psychotic features (HCC) 06/12/2016  . Seizure (HCC) 11/17/2011  . Tobacco abuse 11/17/2011  . Alcohol abuse 11/17/2011  . Cocaine abuse (HCC) 11/17/2011  . Bipolar 2 disorder (HCC) 11/17/2011  . Hypertension 11/17/2011  . Hydrocephalus (HCC) 11/17/2011  . Aspiration pneumonia (HCC) 11/17/2011    History reviewed. No pertinent surgical history.  Prior to Admission medications   Medication Sig Start Date End Date Taking? Authorizing Provider  amLODipine (NORVASC) 10 MG tablet Take 1 tablet  (10 mg total) by mouth daily. For high blood pressure 09/12/16   Nwoko, Nicole Kindred I, NP  divalproex (DEPAKOTE ER) 250 MG 24 hr tablet Take 3 tablets (750 mg total) by mouth at bedtime. For mood stabilization 09/11/16   Armandina Stammer I, NP  FLUoxetine (PROZAC) 20 MG capsule Take 1 capsule (20 mg total) by mouth daily. For depression 09/12/16   Armandina Stammer I, NP  gabapentin (NEURONTIN) 100 MG capsule Take 2 capsules (200 mg total) by mouth 2 (two) times daily. For agitation 09/11/16   Armandina Stammer I, NP  hydrOXYzine (ATARAX/VISTARIL) 25 MG tablet Take 1 tablet (25 mg total) by mouth every 6 (six) hours as needed for anxiety. 09/11/16   Armandina Stammer I, NP  nicotine (NICODERM CQ - DOSED IN MG/24 HOURS) 21 mg/24hr patch Place 1 patch (21 mg total) onto the skin daily. For smoking cessation 09/11/16   Armandina Stammer I, NP  risperiDONE (RISPERDAL) 0.5 MG tablet Take 1 tablet (0.5 mg total) by mouth 2 (two) times daily. For mood control 09/11/16   Armandina Stammer I, NP  traZODone (DESYREL) 50 MG tablet Take 1 tablet (50 mg) at bedtime: For sleep 09/11/16   Armandina Stammer I, NP    Allergies Levofloxacin and Lisinopril  Family History  Problem Relation Age of Onset  . Diabetes type II Mother   . Diabetes type II Other     Social History Social History   Tobacco Use  . Smoking status: Current Every Day Smoker    Packs/day: 0.20    Types:  Cigarettes  . Smokeless tobacco: Never Used  Substance Use Topics  . Alcohol use: Yes    Alcohol/week: 2.0 standard drinks    Types: 2 Cans of beer per week    Comment: 1 40oz Fri. and Sat.  . Drug use: Yes    Types: Cocaine    Comment: Used yesterday    Review of Systems  Constitutional: No fever/chills Eyes: No visual changes. ENT: No sore throat. Cardiovascular: Denies chest pain. Respiratory: Denies shortness of breath. Gastrointestinal: No abdominal pain.  No nausea, no vomiting.  No diarrhea.  No constipation. Genitourinary: Negative for dysuria.  Positive for  testicular pain and swelling. Musculoskeletal: Negative for back pain. Skin: Negative for rash. Neurological: Negative for headaches, focal weakness or numbness.  ____________________________________________   PHYSICAL EXAM:  VITAL SIGNS: ED Triage Vitals [12/22/19 1657]  Enc Vitals Group     BP 112/72     Pulse Rate 79     Resp 18     Temp 98.3 F (36.8 C)     Temp Source Oral     SpO2 99 %     Weight 115 lb 1.3 oz (52.2 kg)     Height 5\' 4"  (1.626 m)     Head Circumference      Peak Flow      Pain Score 0     Pain Loc      Pain Edu?      Excl. in GC?     Constitutional: Alert and oriented. Eyes: Conjunctivae are normal. Head: Atraumatic. Nose: No congestion/rhinnorhea. Mouth/Throat: Mucous membranes are moist. Neck: Normal ROM Cardiovascular: Normal rate, regular rhythm. Grossly normal heart sounds. Respiratory: Normal respiratory effort.  No retractions. Lungs CTAB. Gastrointestinal: Soft and nontender. No distention. Genitourinary: Left testicular edema and tenderness to palpation with no overlying erythema, warmth, or induration.  No hernias noted. Musculoskeletal: No lower extremity tenderness nor edema. Neurologic:  Normal speech and language. No gross focal neurologic deficits are appreciated. Skin:  Skin is warm, dry and intact. No rash noted. Psychiatric: Mood and affect are normal. Speech and behavior are normal.  ____________________________________________   LABS (all labs ordered are listed, but only abnormal results are displayed)  Labs Reviewed  URINALYSIS, COMPLETE (UACMP) WITH MICROSCOPIC     PROCEDURES  Procedure(s) performed (including Critical Care):  Procedures   ____________________________________________   INITIAL IMPRESSION / ASSESSMENT AND PLAN / ED COURSE       48 year old male with past medical history of hypertension, stroke, seizure, and bipolar disorder who presents to the ED complaining of left testicular pain and  swelling worsening over the past 4 days.  He has no signs of infection on exam, no evidence of abscess or cellulitis clinically.  Ultrasound was performed and shows complex left-sided hydrocele, which appears to be the source of his pain.  UA was obtained and is also concerning for infection, we will send urine for culture and treat with Keflex for now.  Patient is appropriate for discharge home with urology follow-up.  Patient and wife agree with plan.      ____________________________________________   FINAL CLINICAL IMPRESSION(S) / ED DIAGNOSES  Final diagnoses:  Hydrocele, unspecified hydrocele type  Pain in left testicle     ED Discharge Orders    None       Note:  This document was prepared using Dragon voice recognition software and may include unintentional dictation errors.   44, MD 12/22/19 12/24/19    Corky Crafts, MD 12/22/19 2009

## 2019-12-22 NOTE — ED Triage Notes (Signed)
Pt presents via POV with c/o left sided testicle pain. Pt states pain has been present for 4 days. Pt reports swelling to left testicle. Pt denies any other urinary symptoms.

## 2019-12-24 LAB — URINE CULTURE

## 2019-12-31 ENCOUNTER — Encounter: Payer: Self-pay | Admitting: Internal Medicine

## 2020-01-06 ENCOUNTER — Ambulatory Visit: Payer: Medicare Other | Admitting: Urology

## 2020-01-06 ENCOUNTER — Other Ambulatory Visit: Payer: Self-pay

## 2020-01-06 ENCOUNTER — Inpatient Hospital Stay (HOSPITAL_COMMUNITY)
Admission: EM | Admit: 2020-01-06 | Discharge: 2020-01-09 | DRG: 190 | Disposition: A | Discharge status: Skilled Nursing Facility | Payer: Medicare Other | Attending: Student in an Organized Health Care Education/Training Program | Admitting: Student in an Organized Health Care Education/Training Program

## 2020-01-06 ENCOUNTER — Emergency Department (HOSPITAL_COMMUNITY): Payer: Medicare Other

## 2020-01-06 DIAGNOSIS — I10 Essential (primary) hypertension: Secondary | ICD-10-CM | POA: Diagnosis present

## 2020-01-06 DIAGNOSIS — G839 Paralytic syndrome, unspecified: Secondary | ICD-10-CM | POA: Diagnosis present

## 2020-01-06 DIAGNOSIS — Z833 Family history of diabetes mellitus: Secondary | ICD-10-CM

## 2020-01-06 DIAGNOSIS — F3181 Bipolar II disorder: Secondary | ICD-10-CM | POA: Diagnosis present

## 2020-01-06 DIAGNOSIS — J441 Chronic obstructive pulmonary disease with (acute) exacerbation: Principal | ICD-10-CM | POA: Diagnosis present

## 2020-01-06 DIAGNOSIS — R531 Weakness: Secondary | ICD-10-CM | POA: Diagnosis present

## 2020-01-06 DIAGNOSIS — Z87891 Personal history of nicotine dependence: Secondary | ICD-10-CM

## 2020-01-06 DIAGNOSIS — F319 Bipolar disorder, unspecified: Secondary | ICD-10-CM | POA: Diagnosis present

## 2020-01-06 DIAGNOSIS — E861 Hypovolemia: Secondary | ICD-10-CM | POA: Diagnosis present

## 2020-01-06 DIAGNOSIS — F1290 Cannabis use, unspecified, uncomplicated: Secondary | ICD-10-CM | POA: Diagnosis present

## 2020-01-06 DIAGNOSIS — Z20822 Contact with and (suspected) exposure to covid-19: Secondary | ICD-10-CM | POA: Diagnosis present

## 2020-01-06 DIAGNOSIS — J9621 Acute and chronic respiratory failure with hypoxia: Secondary | ICD-10-CM | POA: Diagnosis present

## 2020-01-06 DIAGNOSIS — Z681 Body mass index (BMI) 19 or less, adult: Secondary | ICD-10-CM

## 2020-01-06 DIAGNOSIS — Z9981 Dependence on supplemental oxygen: Secondary | ICD-10-CM

## 2020-01-06 DIAGNOSIS — J189 Pneumonia, unspecified organism: Secondary | ICD-10-CM | POA: Diagnosis present

## 2020-01-06 DIAGNOSIS — J440 Chronic obstructive pulmonary disease with acute lower respiratory infection: Secondary | ICD-10-CM | POA: Diagnosis present

## 2020-01-06 DIAGNOSIS — F419 Anxiety disorder, unspecified: Secondary | ICD-10-CM | POA: Diagnosis present

## 2020-01-06 DIAGNOSIS — E440 Moderate protein-calorie malnutrition: Secondary | ICD-10-CM | POA: Diagnosis present

## 2020-01-06 DIAGNOSIS — G40909 Epilepsy, unspecified, not intractable, without status epilepticus: Secondary | ICD-10-CM | POA: Diagnosis present

## 2020-01-06 DIAGNOSIS — I69351 Hemiplegia and hemiparesis following cerebral infarction affecting right dominant side: Secondary | ICD-10-CM

## 2020-01-06 DIAGNOSIS — Z8616 Personal history of COVID-19: Secondary | ICD-10-CM

## 2020-01-06 LAB — CBC WITH DIFFERENTIAL/PLATELET
Abs Immature Granulocytes: 0.04 10*3/uL (ref 0.00–0.07)
Basophils Absolute: 0 10*3/uL (ref 0.0–0.1)
Basophils Relative: 0 %
Eosinophils Absolute: 0.1 10*3/uL (ref 0.0–0.5)
Eosinophils Relative: 1 %
HCT: 36.1 % — ABNORMAL LOW (ref 39.0–52.0)
Hemoglobin: 11 g/dL — ABNORMAL LOW (ref 13.0–17.0)
Immature Granulocytes: 0 %
Lymphocytes Relative: 7 %
Lymphs Abs: 0.8 10*3/uL (ref 0.7–4.0)
MCH: 26.9 pg (ref 26.0–34.0)
MCHC: 30.5 g/dL (ref 30.0–36.0)
MCV: 88.3 fL (ref 80.0–100.0)
Monocytes Absolute: 0.8 10*3/uL (ref 0.1–1.0)
Monocytes Relative: 7 %
Neutro Abs: 9.3 10*3/uL — ABNORMAL HIGH (ref 1.7–7.7)
Neutrophils Relative %: 85 %
Platelets: 141 10*3/uL — ABNORMAL LOW (ref 150–400)
RBC: 4.09 MIL/uL — ABNORMAL LOW (ref 4.22–5.81)
RDW: 16.9 % — ABNORMAL HIGH (ref 11.5–15.5)
WBC: 11 10*3/uL — ABNORMAL HIGH (ref 4.0–10.5)
nRBC: 0 % (ref 0.0–0.2)

## 2020-01-06 LAB — BASIC METABOLIC PANEL
Anion gap: 11 (ref 5–15)
BUN: 16 mg/dL (ref 6–20)
CO2: 31 mmol/L (ref 22–32)
Calcium: 9.7 mg/dL (ref 8.9–10.3)
Chloride: 99 mmol/L (ref 98–111)
Creatinine, Ser: 0.9 mg/dL (ref 0.61–1.24)
GFR, Estimated: >60 mL/min (ref >60)
Glucose, Bld: 123 mg/dL — ABNORMAL HIGH (ref 70–99)
Potassium: 3.8 mmol/L (ref 3.5–5.1)
Sodium: 141 mmol/L (ref 135–145)

## 2020-01-06 LAB — BRAIN NATRIURETIC PEPTIDE: B Natriuretic Peptide: 236.6 pg/mL — ABNORMAL HIGH (ref 0.0–100.0)

## 2020-01-06 LAB — RESPIRATORY PANEL BY RT PCR (FLU A&B, COVID)
Influenza A by PCR: NEGATIVE
Influenza B by PCR: NEGATIVE
SARS Coronavirus 2 by RT PCR: NEGATIVE

## 2020-01-06 LAB — LACTIC ACID, PLASMA: Lactic Acid, Venous: 1.1 mmol/L (ref 0.5–1.9)

## 2020-01-06 MED ORDER — MIRTAZAPINE 15 MG PO TABS
7.5000 mg | ORAL_TABLET | Freq: Every day | ORAL | Status: DC
  Administered 2020-01-06 – 2020-01-08 (×3): 7.5 mg via ORAL
  Filled 2020-01-06 (×4): qty 1

## 2020-01-06 MED ORDER — NICOTINE 21 MG/24HR TD PT24
21.0000 mg | MEDICATED_PATCH | Freq: Every day | TRANSDERMAL | Status: DC
  Administered 2020-01-06 – 2020-01-09 (×4): 21 mg via TRANSDERMAL
  Filled 2020-01-06 (×4): qty 1

## 2020-01-06 MED ORDER — SODIUM CHLORIDE 0.9 % IV SOLN
500.0000 mg | Freq: Once | INTRAVENOUS | Status: AC
  Administered 2020-01-06: 500 mg via INTRAVENOUS
  Filled 2020-01-06: qty 500

## 2020-01-06 MED ORDER — ATORVASTATIN CALCIUM 40 MG PO TABS: 40.0000 mg | ORAL_TABLET | Freq: Every day | ORAL | Status: DC

## 2020-01-06 MED ORDER — PREDNISONE 20 MG PO TABS
40.0000 mg | ORAL_TABLET | Freq: Every day | ORAL | Status: DC
  Administered 2020-01-07 – 2020-01-09 (×3): 40 mg via ORAL
  Filled 2020-01-06 (×5): qty 2

## 2020-01-06 MED ORDER — UMECLIDINIUM BROMIDE 62.5 MCG/INH IN AEPB
1.0000 | INHALATION_SPRAY | Freq: Every day | RESPIRATORY_TRACT | Status: DC
  Administered 2020-01-07 – 2020-01-09 (×3): 1 via RESPIRATORY_TRACT
  Filled 2020-01-06 (×2): qty 7

## 2020-01-06 MED ORDER — IPRATROPIUM BROMIDE HFA 17 MCG/ACT IN AERS
2.0000 | INHALATION_SPRAY | Freq: Once | RESPIRATORY_TRACT | Status: AC
  Administered 2020-01-06: 2 via RESPIRATORY_TRACT
  Filled 2020-01-06: qty 12.9

## 2020-01-06 MED ORDER — ENOXAPARIN SODIUM 40 MG/0.4ML ~~LOC~~ SOLN
40.0000 mg | Freq: Every day | SUBCUTANEOUS | Status: DC
  Administered 2020-01-06 – 2020-01-09 (×4): 40 mg via SUBCUTANEOUS
  Filled 2020-01-06 (×4): qty 0.4

## 2020-01-06 MED ORDER — METHYLPREDNISOLONE SODIUM SUCC 125 MG IJ SOLR
125.0000 mg | Freq: Once | INTRAMUSCULAR | Status: AC
  Administered 2020-01-06: 125 mg via INTRAVENOUS
  Filled 2020-01-06: qty 2

## 2020-01-06 MED ORDER — MAGNESIUM SULFATE 2 GM/50ML IV SOLN
2.0000 g | Freq: Once | INTRAVENOUS | Status: AC
  Administered 2020-01-06: 2 g via INTRAVENOUS
  Filled 2020-01-06: qty 50

## 2020-01-06 MED ORDER — IPRATROPIUM-ALBUTEROL 0.5-2.5 (3) MG/3ML IN SOLN
3.0000 mL | Freq: Four times a day (QID) | RESPIRATORY_TRACT | Status: DC
  Administered 2020-01-06 – 2020-01-09 (×11): 3 mL via RESPIRATORY_TRACT
  Filled 2020-01-06 (×12): qty 3

## 2020-01-06 MED ORDER — ALBUTEROL SULFATE (2.5 MG/3ML) 0.083% IN NEBU
2.5000 mg | INHALATION_SOLUTION | Freq: Four times a day (QID) | RESPIRATORY_TRACT | Status: DC
  Administered 2020-01-06: 2.5 mg via RESPIRATORY_TRACT
  Filled 2020-01-06: qty 3

## 2020-01-06 MED ORDER — ALBUTEROL SULFATE HFA 108 (90 BASE) MCG/ACT IN AERS: 8.0000 | INHALATION_SPRAY | RESPIRATORY_TRACT | Status: DC | PRN

## 2020-01-06 MED ORDER — DIVALPROEX SODIUM 250 MG PO DR TAB
250.0000 mg | DELAYED_RELEASE_TABLET | Freq: Every day | ORAL | Status: DC
  Administered 2020-01-06 – 2020-01-08 (×3): 250 mg via ORAL
  Filled 2020-01-06 (×4): qty 1

## 2020-01-06 MED ORDER — LEVETIRACETAM 250 MG PO TABS
250.0000 mg | ORAL_TABLET | Freq: Two times a day (BID) | ORAL | Status: DC
  Administered 2020-01-06 – 2020-01-09 (×7): 250 mg via ORAL
  Filled 2020-01-06 (×10): qty 1

## 2020-01-06 MED ORDER — SODIUM CHLORIDE 0.9 % IV SOLN
1.0000 g | Freq: Once | INTRAVENOUS | Status: AC
  Administered 2020-01-06: 1 g via INTRAVENOUS
  Filled 2020-01-06: qty 10

## 2020-01-06 MED ORDER — AMLODIPINE BESYLATE 10 MG PO TABS
10.0000 mg | ORAL_TABLET | Freq: Every day | ORAL | Status: DC
  Administered 2020-01-06 – 2020-01-09 (×4): 10 mg via ORAL
  Filled 2020-01-06: qty 2
  Filled 2020-01-06 (×3): qty 1

## 2020-01-06 NOTE — H&P (Addendum)
Date: 01/06/2020               Patient Name:  Brian Lucero MRN: [id number]  DOB: November 21, 1970 Age / Sex: 49 y.o., male   PCP: Lucius Conn, NP         Medical Service: Internal Medicine Teaching Service         Attending Physician: Dr. Oswaldo Done, Marquita Palms, *    First Contact: Dr. Marijo Conception Pager: [phone]  Second Contact: Dr. Mcarthur Rossetti Pager: [phone]       After Hours (After 5p/  First Contact Pager: [phone]  weekends / holidays): Second Contact Pager: [phone]   Chief Complaint: Shortness of breath  History of Present Illness:  Brian Lucero is a 49 yo M w/ PMH of bipolar disorder, COPD, tobacco use, HTN, CVA presenting [facility] with complaint of shortness of breath. He was in his usual state of health until last Sunday when he began to have worsening shortness of breath with wheezing without obvious inciting event. He describes the shortness of breath as gradual onset with wheezing in addition to dry coughs. He mentions that he is vaccinated against COVID and denies any sick contact. He states that he currently uses his inhaler every day and has been using it twice daily. He mentions having 2 other inhalers but is unable to identify which medications they are. He continues to smoke daily. He denies any productive sputum, fevers, chills, nausea, vomiting, diarrhea. He denies any recent hospitalization for similar exacerbation but mentions that he 'needs to quit smoking'  On further questioning, he mentions significant concern regarding his ability to care for himself at home. He mentions that his wife will be going for knee surgery and is unclear if he will be able to care himself at the time. He mentions currently using his wheelchair for ambulation due to hx of stroke with residual right sided weakness and requires assistance with IADLs.  Meds:  Boost glucose TID Proair 2 puffs as needed q4hr  Depakote DR 250mg   Mirtazapine 7.5mg  qhs Levetiracetam 250mg  BID Gabapentin  300mg   Allergies: Allergies as of 01/06/2020 - Review Complete 01/06/2020  Allergen Reaction Noted   Levofloxacin Swelling and Other (See Comments) 06/23/2016   Lisinopril Swelling and Other (See Comments) 06/23/2016   Past Medical History:  Diagnosis Date   Anxiety    Bipolar 2 disorder (HCC)    Depression    Hypertension    Pneumothorax    as a baby in the 59's   Seizures (HCC)    Family History: Denies any clinically relevant family history.  Social History: Lives with wife who will be having a knee surgery this Thursday. Used to walk with walker but it is in storage. Currently uses wheelchair to get around. Able to perform ADLs independently but need assistance with IADLs. Smokes about quarter pack daily since he was 14. Occasional beer on evenings. Mentions smoking marijuana. Denies any other illicit substance use.  Review of Systems: A complete ROS was negative except as per HPI.   Physical Exam: Blood pressure (!) 153/110, pulse 88, temperature (!) 97 F (36.1 C), temperature source Oral, resp. rate (!) 23, height 5\' 5"  (1.651 m), weight 54.4 kg, SpO2 100 %.  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   CXR: personally reviewed my interpretation is hyperinflated lungs with diaphragmatic flattening, no lobar opacity, no pleural effusions  Assessment & Plan by Problem: Principal Problem:   CAP  (community acquired pneumonia) Active Problems:   Hypertension   COPD exacerbation (HCC)  Brian Lucero is a 49 yo M w/ PMH of Chronic respiratory failure on 4L at home, COPD, bipolar disorder, seizure disorder and CVA w/ residual R sided weakness presenting to [facility] with dyspnea and wheezing due to COPD exacerbation and pneumonia.  Acute on chronic hypoxic respiratory failure 2/2 COPD exacerbation and community-acquired pneumonia. Presenting with dyspnea, wheezing on admission with increased oxygen requirement on presentation. Currently back down to home oxygen at 4L after receiving treatment in ED. On oxygen at home due to 'COPD & Asthma.' No PFTs on chart review. Has significant smoking hx. COVID negative. Mentions using maintenance inhalers at home but per review with PCP's office, does not use any maintenance inhaler. COPD exacerbation likely due to lack of maintenance treatment. X-ray suggest possible opacities but no productive sputum or fevers. BNP elevated at 236.6 but prior echo from 2017 w/o CHF. Appear hypovolemic on exam. Received dose of solumedrol, azithromycin and ceftriaxone in ED. - F/u procalcitonin - Prednisone 40mg  daily (day 1/5) - Duonebs every 6 hours - Start Incruse daily - Flutter valve  Hx of CVA History of CVA with Right sided residual lower extremity weakness. Brian Lucero express significant concern regarding ability to care for himself at home due to his neuro deficits. No new neuro deficits on exam. Home med does not include antiplatelet or statins. - PT/OT eval  Bipolar Disorder Hx of bipolar disorder per chart review. Currently denies any manic/depressive symptoms. Not on antipsychotics at home. - Monitor  Tobacco use disorder - Getting nicotine patch  DVT prophx: lovenox Diet: CM Bowel: Miralax Code: Full  Prior to Admission Living Arrangement: Home Anticipated Discharge Location: Home Barriers to Discharge: Medical treatment  Dispo: Admit patient to  Observation with expected length of stay less than 50 midnights.  Signed: , MD 01/06/2020, 2:45 PM  Pager: [phone]

## 2020-01-06 NOTE — Evaluation (Signed)
Physical Therapy Evaluation Patient Details Name: Brian Lucero MRN: [id number] DOB: [birth year] Today's Date: 01/06/2020   History of Present Illness  Pt is a 49 y/o male admitted secondary to COPD exacerbation. PMH includes COPD on 4L O2, CVA with R sided deficits, LUE congential abnormality, Bipolar disorder, and HTN.   Clinical Impression  Pt admitted secondary to problem above with deficits below. Required mod A to sit at edge of stretcher. BP running on the higher side, so checked BP in sitting and BP at 152/108 mmHg. Further mobility deferred. Pt reports his wife is his primary caregiver and she is having surgery and will not be able to care for him. Feel he would benefit from SNF level therapies at d/c to increase independence and safety prior to return home. Will continue to follow acutely.     Follow Up Recommendations SNF    Equipment Recommendations  None recommended by PT    Recommendations for Other Services       Precautions / Restrictions Precautions Precautions: Fall Restrictions Weight Bearing Restrictions: No      Mobility  Bed Mobility Overal bed mobility: Needs Assistance Bed Mobility: Supine to Sit;Sit to Supine     Supine to sit: Mod assist Sit to supine: Mod assist   General bed mobility comments: Mod A for LE and trunk assist throughout bed mobility. BP running high and was at 152/108 upon sitting, so further mobility deferred.     Transfers                    Ambulation/Gait                Stairs            Wheelchair Mobility    Modified Rankin (Stroke Patients Only)       Balance Overall balance assessment: Needs assistance Sitting-balance support: No upper extremity supported;Feet supported Sitting balance-Leahy Scale: Fair                                       Pertinent Vitals/Pain Pain Assessment: No/denies pain    Home Living Family/patient expects to be discharged to:: Skilled nursing  facility                 Additional Comments: Reports wife is going to be having surgery and will be unable to assist.     Prior Function Level of Independence: Needs assistance   Gait / Transfers Assistance Needed: Uses WC for mobility. Needs assist getting out of WC   ADL's / Homemaking Assistance Needed: Wife typically assists with ADLs        Hand Dominance        Extremity/Trunk Assessment   Upper Extremity Assessment Upper Extremity Assessment: Defer to OT evaluation;LUE deficits/detail;RUE deficits/detail RUE Deficits / Details: R sided deficits 2/2 CVA.  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 Was able to perform heel slide.        Communication   Communication: No difficulties  Cognition Arousal/Alertness: Awake/alert Behavior During Therapy: WFL for tasks assessed/performed Overall Cognitive Status: Within Functional Limits for tasks assessed  General Comments      Exercises     Assessment/Plan    PT Assessment Patient needs continued PT services  PT Problem List Decreased strength;Decreased balance;Decreased mobility;Decreased activity tolerance       PT Treatment Interventions DME instruction;Functional mobility training;Therapeutic exercise;Therapeutic activities;Balance training;Patient/family education    PT Goals (Current goals can be found in the Care Plan section)  Acute Rehab PT Goals Patient Stated Goal: to go to SNF after d/c  PT Goal Formulation: With patient Time For Goal Achievement: 01/20/20 Potential to Achieve Goals: Good    Frequency Min 2X/week   Barriers to discharge Decreased caregiver support      Co-evaluation               AM-PAC PT "6 Clicks" Mobility  Outcome Measure Help  needed turning from your back to your side while in a flat bed without using bedrails?: A Little Help needed moving from lying on your back to sitting on the side of a flat bed without using bedrails?: A Lot Help needed moving to and from a bed to a chair (including a wheelchair)?: A Lot Help needed standing up from a chair using your arms (e.g., wheelchair or bedside chair)?: A Lot Help needed to walk in hospital room?: Total Help needed climbing 3-5 steps with a railing? : Total 6 Click Score: 11    End of Session   Activity Tolerance: Treatment limited secondary to medical complications (Comment) (elevated BP ) Patient left: in bed;with call bell/phone within reach (on stretcher in ED ) Nurse Communication: Mobility status PT Visit Diagnosis: Unsteadiness on feet (R26.81);Muscle weakness (generalized) (M62.81);Difficulty in walking, not elsewhere classified (R26.2)    Time: 5366-4403 PT Time Calculation (min) (ACUTE ONLY): 15 min   Charges:   PT Evaluation $PT Eval Moderate Complexity: 1 Mod          Farley Ly, PT, DPT  Acute Rehabilitation Services  Pager: [phone] Office: [phone]   Lehman Prom 01/06/2020, 4:09 PM

## 2020-01-06 NOTE — ED Triage Notes (Signed)
Pt from home, recently d/c from SNF and lives with sister. Pt hx of COPD. 4 liters at baseline O2. Pt called EMS d/t increased work of breathing. Pt normally has nebulizer but is out at the moment.   94% ion EMS arrival. Wheezing and diminished.   103 HR 153/85 20G LAC

## 2020-01-06 NOTE — ED Notes (Signed)
Pt weaned off of Non-rebreather and placed on 5L [place]. Pt SPO2 maintaining at 96%

## 2020-01-06 NOTE — Progress Notes (Incomplete)
H&P  Chief Complaint: ***  History of Present Illness: ***  Past Medical History:  Diagnosis Date   Anxiety    Bipolar 2 disorder (HCC)    Depression    Hypertension    Pneumothorax    as a baby in the 38's   Seizures (HCC)     No past surgical history on file.  Home Medications:  Allergies as of 01/06/2020      Reactions   Levofloxacin Swelling   Lisinopril Swelling      Medication List       Accurate as of January 06, 2020 11:55 AM. If you have any questions, ask your nurse or doctor.        amLODipine 10 MG tablet Commonly known as: NORVASC Take 1 tablet (10 mg total) by mouth daily. For high blood pressure   divalproex 250 MG 24 hr tablet Commonly known as: DEPAKOTE ER Take 3 tablets (750 mg total) by mouth at bedtime. For mood stabilization   FLUoxetine 20 MG capsule Commonly known as: PROZAC Take 1 capsule (20 mg total) by mouth daily. For depression   gabapentin 100 MG capsule Commonly known as: NEURONTIN Take 2 capsules (200 mg total) by mouth 2 (two) times daily. For agitation   hydrOXYzine 25 MG tablet Commonly known as: ATARAX/VISTARIL Take 1 tablet (25 mg total) by mouth every 6 (six) hours as needed for anxiety.   nicotine 21 mg/24hr patch Commonly known as: NICODERM CQ - dosed in mg/24 hours Place 1 patch (21 mg total) onto the skin daily. For smoking cessation   risperiDONE 0.5 MG tablet Commonly known as: RISPERDAL Take 1 tablet (0.5 mg total) by mouth 2 (two) times daily. For mood control   traZODone 50 MG tablet Commonly known as: DESYREL Take 1 tablet (50 mg) at bedtime: For sleep       Allergies:  Allergies  Allergen Reactions   Levofloxacin Swelling   Lisinopril Swelling    Family History  Problem Relation Age of Onset   Diabetes type II Mother    Diabetes type II Other     Social History:  reports that he has been smoking cigarettes. He has been smoking about 0.20 packs per day. He has never used smokeless  tobacco. He reports current alcohol use of about 2.0 standard drinks of alcohol per week. He reports current drug use. Drug: Cocaine.  ROS: A complete review of systems was performed.  All systems are negative except for pertinent findings as noted.  Physical Exam:  Vital signs in last 24 hours: There were no vitals taken for this visit. Constitutional:  Alert and oriented, No acute distress Cardiovascular: Regular rate  Respiratory: Normal respiratory effort GI: Abdomen is soft, nontender, nondistended, no abdominal masses. No CVAT.  Genitourinary: Normal male phallus, testes are descended bilaterally and non-tender and without masses, scrotum is normal in appearance without lesions or masses, perineum is normal on inspection. Lymphatic: No lymphadenopathy Neurologic: Grossly intact, no focal deficits Psychiatric: Normal mood and affect  Laboratory Data:  No results for input(s): WBC, HGB, HCT, PLT in the last 72 hours.  No results for input(s): NA, K, CL, GLUCOSE, BUN, CALCIUM, CREATININE in the last 72 hours.  Invalid input(s): CO3   No results found for this or any previous visit (from the past 24 hour(s)). No results found for this or any previous visit (from the past 240 hour(s)).  Renal Function: No results for input(s): CREATININE in the last 168 hours. CrCl cannot be  calculated (Patient's most recent lab result is older than the maximum 21 days allowed.).  Radiologic Imaging: DG Chest Port 1 View  Result Date: 01/06/2020 CLINICAL DATA:  Shortness of breath for 2 days, no chest pain EXAM: PORTABLE CHEST 1 VIEW COMPARISON:  April 16, 2018 FINDINGS: Trachea midline.  Cardiomediastinal contours are stable. Areas of lucency in the chest suggesting background pulmonary emphysema with hyperinflation. Basilar airspace disease bilaterally slightly worse on the RIGHT than the LEFT. No sign of pleural effusion. On limited assessment no acute skeletal process. IMPRESSION: 1.  Bibasilar airspace disease bilaterally slightly worse on the RIGHT than the LEFT, suspicious for pneumonia, perhaps from aspiration. 2. Probable background pulmonary emphysema. Electronically Signed   By: Donzetta Kohut M.D.   On: 01/06/2020 11:09    Impression/Assessment:  ***  Plan:  ***

## 2020-01-06 NOTE — ED Provider Notes (Signed)
[facility] EMERGENCY DEPARTMENT Provider Note   CSN: [id number] Arrival date & time: 01/06/20  1030     History Chief Complaint  Patient presents with  . COPD    Brian Lucero is a 49 y.o. male.  He has a history of COPD and is on 4 L of oxygen at baseline.  Complaining of increased shortness of breath over the past few days.  Is out of his nebulizer.  Just quit smoking few weeks ago.  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  He said he ambulates somewhat but is unsteady.  The history is provided by the patient and the EMS personnel.  COPD This is a recurrent problem. The current episode started yesterday. The problem occurs constantly. The problem has not changed since onset.Associated symptoms include chest pain and shortness of breath. Pertinent negatives include no abdominal pain and no headaches. Nothing aggravates the symptoms. Nothing relieves the symptoms. He has tried nothing for the symptoms. The treatment provided no relief.       Past Medical History:  Diagnosis Date  . Anxiety   . Bipolar 2 disorder (HCC)   . Depression   . Hypertension   . Pneumothorax    as a baby in the 52's  . Seizures [facility])     Patient Active Problem List   Diagnosis Date Noted  . Bipolar 2 disorder, major depressive episode (HCC) 09/07/2016  . 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 Major depressive disorder, recurrent episode, severe, with psychosis (HCC) 06/12/2016  . Cocaine use disorder, severe, dependence (HCC) 06/12/2016  . Major depressive disorder, recurrent severe without psychotic features (HCC) 06/12/2016  . Seizure (HCC) 11/17/2011  . Tobacco abuse 11/17/2011  . Alcohol abuse 11/17/2011  . Cocaine abuse (HCC) 11/17/2011  . Bipolar 2 disorder (HCC) 11/17/2011  . Hypertension 11/17/2011  . Hydrocephalus (HCC)  11/17/2011  . Aspiration pneumonia (HCC) 11/17/2011    No past surgical history on file.     Family History  Problem Relation Age of Onset  . Diabetes type II Mother   . Diabetes type II Other     Social History   Tobacco Use  . Smoking status: Current Every Day Smoker    Packs/day: 0.20    Types: Cigarettes  . Smokeless tobacco: Never Used  Substance Use Topics  . Alcohol use: Yes    Alcohol/week: 2.0 standard drinks    Types: 2 Cans of beer per week    Comment: 1 40oz Fri. and Sat.  . Drug use: Yes    Types: Cocaine    Comment: Used yesterday    Home Medications Prior to Admission medications   Medication Sig Start Date End Date Taking? Authorizing Provider  amLODipine (NORVASC) 10 MG tablet Take 1 tablet (10 mg total) by mouth daily. For high blood pressure 09/12/16   Nwoko, Nicole Kindred I, NP  divalproex (DEPAKOTE ER) 250 MG 24 hr tablet Take 3 tablets (750 mg total) by mouth at bedtime. For mood stabilization 09/11/16   Armandina Stammer I, NP  FLUoxetine (PROZAC) 20 MG capsule Take 1 capsule (20 mg total) by mouth daily. For depression 09/12/16   Armandina Stammer I, NP  gabapentin (NEURONTIN) 100 MG capsule Take 2 capsules (200 mg total) by mouth 2 (two) times daily. For agitation 09/11/16   Armandina Stammer I, NP  hydrOXYzine (ATARAX/VISTARIL) 25 MG tablet Take  1 tablet (25 mg total) by mouth every 6 (six) hours as needed for anxiety. 09/11/16   Armandina Stammer I, NP  nicotine (NICODERM CQ - DOSED IN MG/24 HOURS) 21 mg/24hr patch Place 1 patch (21 mg total) onto the skin daily. For smoking cessation 09/11/16   Armandina Stammer I, NP  risperiDONE (RISPERDAL) 0.5 MG tablet Take 1 tablet (0.5 mg total) by mouth 2 (two) times daily. For mood control 09/11/16   Armandina Stammer I, NP  traZODone (DESYREL) 50 MG tablet Take 1 tablet (50 mg) at bedtime: For sleep 09/11/16   Armandina Stammer I, NP    Allergies    Levofloxacin and Lisinopril  Review of Systems   Review of Systems  Constitutional: Negative for fever.    HENT: Negative for sore throat.   Eyes: Negative for visual disturbance.  Respiratory: Positive for shortness of breath.   Cardiovascular: Positive for chest pain.  Gastrointestinal: Negative for abdominal pain.  Genitourinary: Negative for dysuria.  Musculoskeletal: Negative for neck pain.  Skin: Negative for rash.  Neurological: Positive for weakness (r side chronic). Negative for headaches.    Physical Exam Updated Vital Signs BP (!) 154/105 (BP Location: Right Arm)   Pulse (!) 108   Temp (!) 97 F (36.1 C) (Oral)   Resp (!) 26   SpO2 100%   Physical Exam Vitals and nursing note reviewed.  Constitutional:      Appearance: Normal appearance. He is well-developed.  HENT:     Head: Normocephalic and atraumatic.  Eyes:     Conjunctiva/sclera: Conjunctivae normal.  Cardiovascular:     Rate and Rhythm: Regular rhythm. Tachycardia present.     Heart sounds: No murmur heard.   Pulmonary:     Effort: Tachypnea and accessory muscle usage present. No respiratory distress.     Breath sounds: Wheezing present.  Abdominal:     Palpations: Abdomen is soft.     Tenderness: There is no abdominal tenderness.  Musculoskeletal:     Cervical back: Neck supple.     Comments: Left upper extremity with birth defect shortened.  Right upper extremity with some contractures  Skin:    General: Skin is warm and dry.  Neurological:     Mental Status: He is alert. Mental status is at baseline.     Comments: He has limited use of his right arm and can shrug of the shoulder.  He has a baseline palsy of left upper extremity.     ED Results / Procedures / Treatments   Labs (all labs ordered are listed, but only abnormal results are displayed) Labs Reviewed  BASIC METABOLIC PANEL - Abnormal; Notable for the following components:      Result Value   Glucose, Bld 123 (*)    All other components within normal limits  CBC WITH DIFFERENTIAL/PLATELET - Abnormal; Notable for the following  components:   WBC 11.0 (*)    RBC 4.09 (*)    Hemoglobin 11.0 (*)    HCT 36.1 (*)    RDW 16.9 (*)    Platelets 141 (*)    Neutro Abs 9.3 (*)    All other components within normal limits  BRAIN NATRIURETIC PEPTIDE - Abnormal; Notable for the following components:   B Natriuretic Peptide 236.6 (*)    All other components within normal limits  RESPIRATORY PANEL BY RT PCR (FLU A&B, COVID)  CULTURE, BLOOD (ROUTINE X 2)  CULTURE, BLOOD (ROUTINE X 2)  LACTIC ACID, PLASMA  HIV ANTIBODY (ROUTINE TESTING  W REFLEX)  PROCALCITONIN    EKG EKG Interpretation  Date/Time:  Tuesday January 06 2020 10:46:03 EDT Ventricular Rate:  107 PR Interval:    QRS Duration: 73 QT Interval:  340 QTC Calculation: 454 R Axis:   36 Text Interpretation: Sinus tachycardia Multiple ventricular premature complexes LVH with secondary repolarization abnormality Probable anterior infarct, age indeterminate Artifact in lead(s) I III aVR aVL aVF V4 V5 V6 No significant change since prior 4/18 Confirmed by Meridee ScoreButler, Raelie Lohr [phone]([id number]) on 01/06/2020 10:57:57 AM   Radiology DG Chest Port 1 View  Result Date: 01/06/2020 CLINICAL DATA:  Shortness of breath for 2 days, no chest pain EXAM: PORTABLE CHEST 1 VIEW COMPARISON:  April 16, 2018 FINDINGS: Trachea midline.  Cardiomediastinal contours are stable. Areas of lucency in the chest suggesting background pulmonary emphysema with hyperinflation. Basilar airspace disease bilaterally slightly worse on the RIGHT than the LEFT. No sign of pleural effusion. On limited assessment no acute skeletal process. IMPRESSION: 1. Bibasilar airspace disease bilaterally slightly worse on the RIGHT than the LEFT, suspicious for pneumonia, perhaps from aspiration. 2. Probable background pulmonary emphysema. Electronically Signed   By: Donzetta KohutGeoffrey  Wile M.D.   On: 01/06/2020 11:09    Procedures Procedures (including critical care time)  Medications Ordered in ED Medications  magnesium sulfate  IVPB 2 g 50 mL (has no administration in time range)  methylPREDNISolone sodium succinate (SOLU-MEDROL) 125 mg/2 mL injection 125 mg (has no administration in time range)  albuterol (VENTOLIN HFA) 108 (90 Base) MCG/ACT inhaler 8 puff (has no administration in time range)  ipratropium (ATROVENT HFA) inhaler 2 puff (has no administration in time range)  cefTRIAXone (ROCEPHIN) 1 g in sodium chloride 0.9 % 100 mL IVPB (has no administration in time range)  azithromycin (ZITHROMAX) 500 mg in sodium chloride 0.9 % 250 mL IVPB (has no administration in time range)    ED Course  I have reviewed the triage vital signs and the nursing notes.  Pertinent labs & imaging results that were available during my care of the patient were reviewed by me and considered in my medical decision making (see chart for details).  Clinical Course as of Jan 06 1112  Tue Jan 06, 2020  1112 Chest x-ray showing possible infiltrates lower lobes.  Awaiting radiology reading.   [MB]    Clinical Course User Index [MB] Terrilee FilesButler, Undine Nealis C, MD   MDM Rules/Calculators/A&P                         Egbert GaribaldiRandall Cude was evaluated in Emergency Department on 01/06/2020 for the symptoms described in the history of present illness. He was evaluated in the context of the global COVID-19 pandemic, which necessitated consideration that the patient might be at risk for infection with the SARS-CoV-2 virus that causes COVID-19. Institutional protocols and algorithms that pertain to the evaluation of patients at risk for COVID-19 are in a state of rapid change based on information released by regulatory bodies including the CDC and federal and state organizations. These policies and algorithms were followed during the patient's care in the ED.  This patient complains of increased shortness of breath since yesterday; this involves an extensive number of treatment Options and is a complaint that carries with it a high risk of complications  and Morbidity. The differential includes COPD, CHF, pneumothorax, pneumonia, Covid, anemia  I ordered, reviewed and interpreted labs, which included CBC with a mildly elevated white count, hemoglobin slightly lower than baseline, chemistries  fairly normal other than elevated glucose, BNP elevated no priors to compare with, lactate normal, Covid and flu testing negative I ordered medication IV steroids and breathing treatments, IV magnesium, IV antibiotics I ordered imaging studies which included chest x-ray and I independently    visualized and interpreted imaging which showed possible infiltrates left greater than right Additional history obtained from EMS Previous records obtained and reviewed in epic, no recent admissions I consulted internal medicine teaching service and discussed lab and imaging findings  Critical Interventions: None  After the interventions stated above, I reevaluated the patient and found patient's breathing to be improved but not back to baseline.  Feel he will need to be admitted to the hospital for continued treatment of his COPD exacerbation and possible pneumonia.  Patient in agreement with plan.   Final Clinical Impression(s) / ED Diagnoses Final diagnoses:  COPD exacerbation (HCC)  Community acquired pneumonia, unspecified laterality    Rx / DC Orders ED Discharge Orders    None       Terrilee Files, MD 01/06/20 1907

## 2020-01-07 DIAGNOSIS — E861 Hypovolemia: Secondary | ICD-10-CM | POA: Diagnosis present

## 2020-01-07 DIAGNOSIS — Z20822 Contact with and (suspected) exposure to covid-19: Secondary | ICD-10-CM | POA: Diagnosis present

## 2020-01-07 DIAGNOSIS — J189 Pneumonia, unspecified organism: Secondary | ICD-10-CM | POA: Diagnosis present

## 2020-01-07 DIAGNOSIS — I69351 Hemiplegia and hemiparesis following cerebral infarction affecting right dominant side: Secondary | ICD-10-CM | POA: Diagnosis not present

## 2020-01-07 DIAGNOSIS — Z87891 Personal history of nicotine dependence: Secondary | ICD-10-CM | POA: Diagnosis not present

## 2020-01-07 DIAGNOSIS — E440 Moderate protein-calorie malnutrition: Secondary | ICD-10-CM | POA: Diagnosis present

## 2020-01-07 DIAGNOSIS — G839 Paralytic syndrome, unspecified: Secondary | ICD-10-CM | POA: Diagnosis present

## 2020-01-07 DIAGNOSIS — F3181 Bipolar II disorder: Secondary | ICD-10-CM | POA: Diagnosis present

## 2020-01-07 DIAGNOSIS — F419 Anxiety disorder, unspecified: Secondary | ICD-10-CM | POA: Diagnosis present

## 2020-01-07 DIAGNOSIS — G40909 Epilepsy, unspecified, not intractable, without status epilepticus: Secondary | ICD-10-CM | POA: Diagnosis present

## 2020-01-07 DIAGNOSIS — Z833 Family history of diabetes mellitus: Secondary | ICD-10-CM | POA: Diagnosis not present

## 2020-01-07 DIAGNOSIS — Z681 Body mass index (BMI) 19 or less, adult: Secondary | ICD-10-CM | POA: Diagnosis not present

## 2020-01-07 DIAGNOSIS — R531 Weakness: Secondary | ICD-10-CM | POA: Diagnosis present

## 2020-01-07 DIAGNOSIS — J440 Chronic obstructive pulmonary disease with acute lower respiratory infection: Secondary | ICD-10-CM | POA: Diagnosis present

## 2020-01-07 DIAGNOSIS — Z8673 Personal history of transient ischemic attack (TIA), and cerebral infarction without residual deficits: Secondary | ICD-10-CM

## 2020-01-07 DIAGNOSIS — Z9981 Dependence on supplemental oxygen: Secondary | ICD-10-CM | POA: Diagnosis not present

## 2020-01-07 DIAGNOSIS — J441 Chronic obstructive pulmonary disease with (acute) exacerbation: Secondary | ICD-10-CM | POA: Diagnosis present

## 2020-01-07 DIAGNOSIS — F1290 Cannabis use, unspecified, uncomplicated: Secondary | ICD-10-CM | POA: Diagnosis present

## 2020-01-07 DIAGNOSIS — Z8616 Personal history of COVID-19: Secondary | ICD-10-CM | POA: Diagnosis not present

## 2020-01-07 DIAGNOSIS — J9621 Acute and chronic respiratory failure with hypoxia: Secondary | ICD-10-CM | POA: Diagnosis present

## 2020-01-07 DIAGNOSIS — I10 Essential (primary) hypertension: Secondary | ICD-10-CM | POA: Diagnosis present

## 2020-01-07 DIAGNOSIS — F319 Bipolar disorder, unspecified: Secondary | ICD-10-CM | POA: Diagnosis present

## 2020-01-07 LAB — BASIC METABOLIC PANEL
Anion gap: 10 (ref 5–15)
BUN: 20 mg/dL (ref 6–20)
CO2: 29 mmol/L (ref 22–32)
Calcium: 9.2 mg/dL (ref 8.9–10.3)
Chloride: 101 mmol/L (ref 98–111)
Creatinine, Ser: 0.82 mg/dL (ref 0.61–1.24)
GFR, Estimated: >60 mL/min (ref >60)
Glucose, Bld: 131 mg/dL — ABNORMAL HIGH (ref 70–99)
Potassium: 3.7 mmol/L (ref 3.5–5.1)
Sodium: 140 mmol/L (ref 135–145)

## 2020-01-07 LAB — HIV ANTIBODY (ROUTINE TESTING W REFLEX): HIV Screen 4th Generation wRfx: NONREACTIVE

## 2020-01-07 LAB — CBC
HCT: 30.3 % — ABNORMAL LOW (ref 39.0–52.0)
Hemoglobin: 9.3 g/dL — ABNORMAL LOW (ref 13.0–17.0)
MCH: 26 pg (ref 26.0–34.0)
MCHC: 30.7 g/dL (ref 30.0–36.0)
MCV: 84.6 fL (ref 80.0–100.0)
Platelets: 151 10*3/uL (ref 150–400)
RBC: 3.58 MIL/uL — ABNORMAL LOW (ref 4.22–5.81)
RDW: 16.7 % — ABNORMAL HIGH (ref 11.5–15.5)
WBC: 9.1 10*3/uL (ref 4.0–10.5)
nRBC: 0 % (ref 0.0–0.2)

## 2020-01-07 LAB — GLUCOSE, CAPILLARY: Glucose-Capillary: 204 mg/dL — ABNORMAL HIGH (ref 70–99)

## 2020-01-07 LAB — CBG MONITORING, ED: Glucose-Capillary: 126 mg/dL — ABNORMAL HIGH (ref 70–99)

## 2020-01-07 LAB — PROCALCITONIN: Procalcitonin: 2.69 ng/mL

## 2020-01-07 MED ORDER — AZITHROMYCIN 250 MG PO TABS
500.0000 mg | ORAL_TABLET | Freq: Every day | ORAL | Status: DC
  Administered 2020-01-07 – 2020-01-09 (×3): 500 mg via ORAL
  Filled 2020-01-07 (×3): qty 2

## 2020-01-07 MED ORDER — SODIUM CHLORIDE 0.9 % IV SOLN
1.0000 g | INTRAVENOUS | Status: DC
  Administered 2020-01-07 – 2020-01-09 (×3): 1 g via INTRAVENOUS
  Filled 2020-01-07 (×2): qty 10
  Filled 2020-01-07: qty 1
  Filled 2020-01-07: qty 10

## 2020-01-07 MED ORDER — SODIUM CHLORIDE 0.9 % IV SOLN
500.0000 mg | INTRAVENOUS | Status: DC
  Filled 2020-01-07 (×2): qty 500

## 2020-01-07 NOTE — Progress Notes (Addendum)
   Subjective:  Patient evaluated at bedside this AM. He endorses improved breathing and chest pain on inspiration. Denies any cough. He uses walker for ambulation due to persistent right sided weakness from prior stroke.  Patient endorses history of CVA about 1 year ago resulting in memory impairment. He was in [place] with wife but wants to go to rehab as his wife is getting achilles repaired.   Objective:  Vital signs in last 24 hours: Vitals:   01/07/20 0345 01/07/20 0400 01/07/20 0500 01/07/20 0600  BP:  129/89 93/75 (!) 111/91  Pulse: 78 84 74 76  Resp: 18 20 (!) 30 18  Temp:      TempSrc:      SpO2: 97% 93% 96% 96%  Weight:      Height:       Physical Exam: General: Chronically ill-appearing, no acute distress Pulm: Wheezing present bilaterally MSK: Congenital left upper extremity deformity Neuro: Alert, awake, oriented x3  Assessment/Plan: Mr. Budney is 49yo male with COPD with oxygen requirement of 4L, previous CVA w/ residual right-sided weakness, bipolar disorder, and seizure disorder admitted 11/2 for worsening hypoxic respiratory failure due to community-acquired pneumonia.  Principal Problem:   CAP (community acquired pneumonia) Active Problems:   Hypertension   COPD exacerbation (HCC)  #Acute on chronic hypoxic respiratory failure 2/2 community-acquired PNA Patient reports improvement of SOB this AM. Denies productive cough, fevers, chills. Currently on home oxygen requirement. Given opacity on chest XR and elevated procalcitonin, will continue antibiotics. Patient will need follow-up for medication adjustments, as he previously was just taking albuterol BID.  - Prednisone 40 (day 2/5) - CBG monitoring given steroids - Duonebs q6, flutter valve - Incruse qd - C/w ceftriaxone, azithromycin   #Hx CVA Patient with residual right-sided lower extremity weakness. Expresses concern over ability to care for himself at home given wife will have surgery this week.  Requests to be sent to rehabilitation in [place]. Will have PT/OT evaluate. - PT/OT  DIET: CM IVF: n/a DVT PPX: Lovenox BOWEL: Miralax CODE: FULL  Prior to Admission Living Arrangement: Home Anticipated Discharge Location: SNF Barriers to Discharge: medical management Dispo: Anticipated discharge in approximately 1-2 day(s).   Evlyn Kanner, MD 01/07/2020, 6:51 AM Pager: [phone] After 5pm on weekdays and 1pm on weekends: On Call pager [phone]

## 2020-01-07 NOTE — Evaluation (Signed)
Occupational Therapy Evaluation Patient Details Name: Brian Lucero MRN: [id number] DOB: Jan 25, 1971 Today's Date: 01/07/2020    History of Present Illness Pt is a 49 y/o male admitted secondary to COPD exacerbation. PMH includes COPD on 4L O2, CVA with R sided deficits, LUE congential abnormality, Bipolar disorder, and HTN.    Clinical Impression   PTA pt living with spouse, requiring as needed assist for ADLs from wife. Pt would use w/c vs platform walker for mobility, and wife would assist with self care as needed. At time of eval, pt able to complete bed mobility at min A level and sit <> stands and mobility with mod A with support from eva walker. Pt was able to complete functional mobility a short household distance but required mod A to maintain steady with eva walker. RUE was tied with a pillow case to the handle of the eva walker for safety (would fall off platform and cause pt to lose balance). OT stood behind pt to help steady with mobility. Given current status, recommend SNF at d/c for continued progression of ADLs prior to returning home. Pts wife is about to have sx and will not be able to provide usual care. Will continue to follow per POC listed below.     Follow Up Recommendations  SNF;Supervision/Assistance - 24 hour    Equipment Recommendations  None recommended by OT    Recommendations for Other Services       Precautions / Restrictions Precautions Precautions: Fall Restrictions Weight Bearing Restrictions: No      Mobility Bed Mobility Overal bed mobility: Needs Assistance Bed Mobility: Supine to Sit;Sit to Supine     Supine to sit: Min assist     General bed mobility comments: pt able to initiate transfer and achieve upright, but required assist to scoot hips out to EOB for full sitting posture    Transfers Overall transfer level: Needs assistance Equipment used: 4-wheeled walker (eva) Transfers: Sit to/from [employer] Sit to Stand:  Mod assist Stand pivot transfers: Mod assist       General transfer comment: eva walker utilized to best support BUE impairments. Assist needed for both power up and steadying assist    Balance Overall balance assessment: Needs assistance Sitting-balance support: No upper extremity supported;Feet supported Sitting balance-Leahy Scale: Fair     Standing balance support: Bilateral upper extremity supported;During functional activity Standing balance-Leahy Scale: Poor Standing balance comment: heavy reliance on BUE support (through elbows)                           ADL either performed or assessed with clinical judgement   ADL Overall ADL's : Needs assistance/impaired Eating/Feeding: Minimal assistance;Sitting   Grooming: Minimal assistance;Sitting   Upper Body Bathing: Moderate assistance;Maximal assistance;Sitting   Lower Body Bathing: Maximal assistance;Total assistance;Sitting/lateral leans;Sit to/from stand   Upper Body Dressing : Moderate assistance;Sitting   Lower Body Dressing: Maximal assistance;Total assistance;Sitting/lateral leans;Sit to/from stand   Toilet Transfer: Moderate assistance;Ambulation;Regular Teacher, adult education Details (indicate cue type and reason): use of eva walker due to conditions impacting BUE function. mod A for power up and steadying for mobility to toilet. Toileting- Clothing Manipulation and Hygiene: Total assistance;Sitting/lateral lean;Sit to/from stand       Functional mobility during ADLs: Moderate assistance;Cueing for sequencing;Cueing for safety (eva walker) General ADL Comments: pt limited by baseline RUE flaccidity from prior CVA and congential deformity of LUE     Vision Patient Visual Report: No change  from baseline       Perception     Praxis      Pertinent Vitals/Pain Pain Assessment: No/denies pain     Hand Dominance     Extremity/Trunk Assessment Upper Extremity Assessment Upper Extremity  Assessment: RUE deficits/detail;LUE deficits/detail RUE Deficits / Details: baseline flaccidity from CVA. 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 has elbow joint, minimal forearm, and flexed wrist with minimal functional hand usage   Lower Extremity Assessment Lower Extremity Assessment: Defer to PT evaluation       Communication Communication Communication: No difficulties   Cognition Arousal/Alertness: Awake/alert Behavior During Therapy: WFL for tasks assessed/performed Overall Cognitive Status: Within Functional Limits for tasks assessed                                     General Comments       Exercises     Shoulder Instructions      Home Living Family/patient expects to be discharged to:: Skilled nursing facility                                 Additional Comments: Reports wife is going to be having surgery and will be unable to assist.       Prior Functioning/Environment Level of Independence: Needs assistance  Gait / Transfers Assistance Needed: uses w/c and platform walker with strap for RUE ADL's / Homemaking Assistance Needed: Wife typically assists with higher level ADLs   Comments: baseline L arthrogryposis and R flaccidity from prior CVA        OT Problem List: Decreased strength;Decreased knowledge of use of DME or AE;Impaired tone;Decreased range of motion;Decreased coordination;Decreased activity tolerance;Impaired UE functional use;Cardiopulmonary status limiting activity;Impaired balance (sitting and/or standing)      OT Treatment/Interventions: Self-care/ADL training;Therapeutic exercise;Patient/family education;Balance training;Neuromuscular education;Energy conservation;Therapeutic activities;DME and/or AE instruction    OT Goals(Current goals can be found in the care plan section)  Acute Rehab OT Goals Patient Stated Goal: to go to SNF after d/c  OT Goal Formulation: With patient Time For Goal Achievement: 01/21/20 Potential to Achieve Goals: Good  OT Frequency: Min 2X/week   Barriers to D/C:            Co-evaluation              AM-PAC OT "6 Clicks" Daily Activity     Outcome Measure Help from another person eating meals?: A Little Help from another person taking care of personal grooming?: A Little Help from another person toileting, which includes using toliet, bedpan, or urinal?: A Lot Help from another person bathing (including washing, rinsing, drying)?: A Lot Help from another person to put on and taking off regular upper body clothing?: A Little Help from another person to put on and taking off regular lower body clothing?: Total 6 Click Score: 14   End of Session Equipment Utilized During Treatment: Rolling walker;Gait belt;Oxygen Nurse Communication: Mobility status  Activity Tolerance: Patient tolerated treatment well Patient left: in chair;with call bell/phone within reach  OT Visit Diagnosis: Unsteadiness on feet (R26.81);Other abnormalities of gait and mobility (R26.89);Muscle weakness (generalized) (M62.81);Hemiplegia and hemiparesis Hemiplegia - Right/Left: Right                Time: 0109-3235 OT  Time Calculation (min): 37 min Charges:  OT General Charges $OT Visit: 1 Visit OT Evaluation $OT Eval Moderate Complexity: 1 Mod OT Treatments $Self Care/Home Management : 8-22 mins  Dalphine Handing, MSOT, OTR/L Acute Rehabilitation Services [facility] Office Number: [phone] Pager: [phone]  Dalphine Handing 01/07/2020, 2:24 PM

## 2020-01-07 NOTE — ED Notes (Signed)
Report given pt is going up stairs with tech

## 2020-01-07 NOTE — NC FL2 (Signed)
[place] MEDICAID FL2 LEVEL OF CARE SCREENING TOOL     IDENTIFICATION  Patient Name: Brian Lucero Birthdate: [birth year] Sex: male Admission Date (Current Location): 01/06/2020  [place] and [place] Number:  Haynes Bast [id number] K Facility and Address:  [facility], [address], [place], [place] [address]      Provider Number: [id number]  Attending Physician Name and Address:  Tyson Alias, *  Relative Name and Phone Number:  Rolla Plate [phone]    Current Level of Care: Hospital Recommended Level of Care: Skilled Nursing Facility Prior Approval Number:    Date Approved/Denied:   PASRR Number: [id number]  Discharge Plan: SNF    Current Diagnoses: Patient Active Problem List   Diagnosis Date Noted  . CAP (community acquired pneumonia) 01/07/2020  . COPD exacerbation (HCC) 01/06/2020  . Bipolar 2 disorder, major depressive episode (HCC) 09/07/2016  . Major depressive disorder, recurrent episode, severe, with psychosis (HCC) 06/12/2016  . Major depressive disorder, recurrent severe without psychotic features (HCC) 06/12/2016  . Seizure (HCC) 11/17/2011  . Tobacco abuse 11/17/2011  . Hypertension 11/17/2011  . Hydrocephalus (HCC) 11/17/2011    Orientation RESPIRATION BLADDER Height & Weight     Self, Time, Situation, Place  O2 Continent Weight: 120 lb (54.4 kg) Height:  5\' 5"  (165.1 cm)  BEHAVIORAL SYMPTOMS/MOOD NEUROLOGICAL BOWEL NUTRITION STATUS    Convulsions/Seizures Continent Diet (carb modified, see discharge summary)  AMBULATORY STATUS COMMUNICATION OF NEEDS Skin   Total Care Verbally Normal                       Personal Care Assistance Level of Assistance  Bathing, Feeding, Dressing Bathing Assistance: Maximum assistance Feeding assistance: Limited assistance Dressing Assistance: Maximum assistance     Functional Limitations Info  Sight, Hearing, Speech Sight Info: Adequate Hearing Info:  Adequate Speech Info: Adequate    SPECIAL CARE FACTORS FREQUENCY  PT (By licensed PT), OT (By licensed OT)     PT Frequency: 5x week OT Frequency: 5x week            Contractures Contractures Info: Not present    Additional Factors Info  Code Status, Allergies Code Status Info: full Allergies Info: Levofloxacin, Lisinopril           Current Medications (01/07/2020):  This is the current hospital active medication list Current Facility-Administered Medications  Medication Dose Route Frequency Provider Last Rate Last Admin  . amLODipine (NORVASC) tablet 10 mg  10 mg Oral Daily 13/05/2019, MD   10 mg at 01/07/20 1008  . azithromycin (ZITHROMAX) tablet 500 mg  500 mg Oral Daily Aslam, 13/03/21, MD   500 mg at 01/07/20 1418  . cefTRIAXone (ROCEPHIN) 1 g in sodium chloride 0.9 % 100 mL IVPB  1 g Intravenous Q24H Aslam, Sadia, MD 200 mL/hr at 01/07/20 1419 1 g at 01/07/20 1419  . divalproex (DEPAKOTE) DR tablet 250 mg  250 mg Oral QHS 13/03/21, MD   250 mg at 01/06/20 2128  . enoxaparin (LOVENOX) injection 40 mg  40 mg Subcutaneous Daily 2129, MD   40 mg at 01/07/20 1012  . ipratropium-albuterol (DUONEB) 0.5-2.5 (3) MG/3ML nebulizer solution 3 mL  3 mL Nebulization Q6H 13/03/21, MD   3 mL at 01/07/20 1440  . levETIRAcetam (KEPPRA) tablet 250 mg  250 mg Oral BID 13/03/21, MD   250 mg at 01/07/20 1418  . mirtazapine (REMERON) tablet 7.5 mg  7.5 mg Oral QHS Theotis Barrio, MD   7.5 mg at 01/06/20 2129  . nicotine (NICODERM CQ - dosed in mg/24 hours) patch 21 mg  21 mg Transdermal Daily Theotis Barrio, MD   21 mg at 01/07/20 1011  . predniSONE (DELTASONE) tablet 40 mg  40 mg Oral Q breakfast Theotis Barrio, MD   40 mg at 01/07/20 7948  . umeclidinium bromide (INCRUSE ELLIPTA) 62.5 MCG/INH 1 puff  1 puff Inhalation Daily Theotis Barrio, MD   1 puff at 01/07/20 0165     Discharge Medications: Please see discharge summary for a list of discharge  medications.  Relevant Imaging Results:  Relevant Lab Results:   Additional Information SSN [id number]  Lorri Frederick, LCSW

## 2020-01-07 NOTE — TOC Initial Note (Signed)
Transition of Care [facility]) - Initial/Assessment Note    Patient Details  Name: Brian Lucero MRN: [id number] Date of Birth: 06-05-1970  Transition of Care [facility]) CM/SW Contact:    Lorri Frederick, LCSW Phone Number: 01/07/2020, 4:00 PM  Clinical Narrative: CSW spoke with pt to discuss discharge plan.  Pt agreeable to plan for SNF and requesting Brian Lucero, states he spoke to them on the phone today and was told they have a bed.  Choice document given.  Permission given to speak with wife and to send out referral to SNF.  Pt is vaccinated for covid.                   Expected Discharge Plan: Skilled Nursing Facility Barriers to Discharge: Continued Medical Work up, SNF Pending bed offer   Patient Goals and CMS Choice Patient states their goals for this hospitalization and ongoing recovery are:: independance at home CMS Medicare.gov Compare Post Acute Care list provided to:: Patient Choice offered to / list presented to : Patient  Expected Discharge Plan and Services Expected Discharge Plan: Skilled Nursing Facility     Post Acute Care Choice: Skilled Nursing Facility Living arrangements for the past 2 months: Single Family Home                                      Prior Living Arrangements/Services Living arrangements for the past 2 months: Single Family Home Lives with:: Spouse, Adult Children Patient language and need for interpreter reviewed:: Yes Do you feel safe going back to the place where you live?: Yes      Need for Family Participation in Patient Care: No (Comment) Care giver support system in place?: Yes (comment) Current home services: Other (comment) (none) Criminal Activity/Legal Involvement Pertinent to Current Situation/Hospitalization: No - Comment as needed  Activities of Daily Living      Permission Sought/Granted Permission sought to share information with : Family Supports, Oceanographer granted to share  information with : Yes, Verbal Permission Granted  Share Information with NAME: wife, [name]  Permission granted to share info w AGENCY: SNF        Emotional Assessment Appearance:: Appears older than stated age Attitude/Demeanor/Rapport: Engaged Affect (typically observed): Appropriate, Pleasant Orientation: : Oriented to Self, Oriented to Place, Oriented to  Time, Oriented to Situation Alcohol / Substance Use: Alcohol Use, Illicit Drugs Psych Involvement: No (comment)  Admission diagnosis:  COPD exacerbation (HCC) [J44.1] Community acquired pneumonia, unspecified laterality [J18.9] Patient Active Problem List   Diagnosis Date Noted  . CAP (community acquired pneumonia) 01/07/2020  . COPD exacerbation (HCC) 01/06/2020  . Bipolar 2 disorder, major depressive episode (HCC) 09/07/2016  . Major depressive disorder, recurrent episode, severe, with psychosis (HCC) 06/12/2016  . Major depressive disorder, recurrent severe without psychotic features (HCC) 06/12/2016  . Seizure (HCC) 11/17/2011  . Tobacco abuse 11/17/2011  . Hypertension 11/17/2011  . Hydrocephalus (HCC) 11/17/2011   PCP:  Lucius Conn, NP Pharmacy:   [facility] [place] - [place], [place] - [address] [place] [address] Phone: [phone] Fax: [phone]     Social Determinants of Health (SDOH) Interventions    Readmission Risk Interventions No flowsheet data found.

## 2020-01-08 DIAGNOSIS — E440 Moderate protein-calorie malnutrition: Secondary | ICD-10-CM | POA: Insufficient documentation

## 2020-01-08 LAB — BASIC METABOLIC PANEL
Anion gap: 13 (ref 5–15)
BUN: 24 mg/dL — ABNORMAL HIGH (ref 6–20)
CO2: 25 mmol/L (ref 22–32)
Calcium: 9.1 mg/dL (ref 8.9–10.3)
Chloride: 102 mmol/L (ref 98–111)
Creatinine, Ser: 1.05 mg/dL (ref 0.61–1.24)
GFR, Estimated: >60 mL/min (ref >60)
Glucose, Bld: 201 mg/dL — ABNORMAL HIGH (ref 70–99)
Potassium: 3.9 mmol/L (ref 3.5–5.1)
Sodium: 140 mmol/L (ref 135–145)

## 2020-01-08 LAB — CBC
HCT: 29.1 % — ABNORMAL LOW (ref 39.0–52.0)
Hemoglobin: 9.1 g/dL — ABNORMAL LOW (ref 13.0–17.0)
MCH: 26.7 pg (ref 26.0–34.0)
MCHC: 31.3 g/dL (ref 30.0–36.0)
MCV: 85.3 fL (ref 80.0–100.0)
Platelets: 167 10*3/uL (ref 150–400)
RBC: 3.41 MIL/uL — ABNORMAL LOW (ref 4.22–5.81)
RDW: 16.7 % — ABNORMAL HIGH (ref 11.5–15.5)
WBC: 10.1 10*3/uL (ref 4.0–10.5)
nRBC: 0 % (ref 0.0–0.2)

## 2020-01-08 LAB — GLUCOSE, CAPILLARY
Glucose-Capillary: 160 mg/dL — ABNORMAL HIGH (ref 70–99)
Glucose-Capillary: 240 mg/dL — ABNORMAL HIGH (ref 70–99)
Glucose-Capillary: 229 mg/dL — ABNORMAL HIGH (ref 70–99)
Glucose-Capillary: 285 mg/dL — ABNORMAL HIGH (ref 70–99)

## 2020-01-08 LAB — SARS CORONAVIRUS 2 BY RT PCR (HOSPITAL ORDER, PERFORMED IN ~~LOC~~ HOSPITAL LAB): SARS Coronavirus 2: NEGATIVE

## 2020-01-08 MED ORDER — ADULT MULTIVITAMIN W/MINERALS CH
1.0000 | ORAL_TABLET | Freq: Every day | ORAL | Status: DC
  Administered 2020-01-08 – 2020-01-09 (×2): 1 via ORAL
  Filled 2020-01-08 (×2): qty 1

## 2020-01-08 MED ORDER — INSULIN ASPART 100 UNIT/ML ~~LOC~~ SOLN
0.0000 [IU] | Freq: Three times a day (TID) | SUBCUTANEOUS | Status: DC
  Administered 2020-01-08 (×2): 3 [IU] via SUBCUTANEOUS

## 2020-01-08 MED ORDER — ENSURE ENLIVE PO LIQD
237.0000 mL | Freq: Two times a day (BID) | ORAL | Status: DC
  Administered 2020-01-08 – 2020-01-09 (×3): 237 mL via ORAL

## 2020-01-08 NOTE — Progress Notes (Signed)
Inpatient Diabetes Program Recommendations  AACE/ADA: New Consensus Statement on Inpatient Glycemic Control (2015)  Target Ranges:  Prepandial:   less than 140 mg/dL      Peak postprandial:   less than 180 mg/dL (1-2 hours)      Critically ill patients:  140 - 180 mg/dL   Lab Results  Component Value Date   GLUCAP 240 (H) 01/08/2020    Review of Glycemic Control Results for KIYON, FIDALGO (MRN [id number]) as of 01/08/2020 14:10  Ref. Range 01/07/2020 08:26 01/07/2020 22:11 01/08/2020 07:37 01/08/2020 11:51  Glucose-Capillary Latest Ref Range: 70 - 99 mg/dL 544 (H) 920 (H) 100 (H) 240 (H)   Diabetes history: DM 2 Outpatient Diabetes medications:  Metformin 500 mg q AM Current orders for Inpatient glycemic control:  Novolog sensitive tid with meals Prednisone 40 mg daily with breakfast  Inpatient Diabetes Program Recommendations:    While on Prednisone, may consider adding Novolog meal coverage 2 units tid with meals (hold if patient eats less than 50% or NPO).   Thanks  Beryl Meager, RN, BC-ADM Inpatient Diabetes Coordinator Pager [phone] (8a-5p)

## 2020-01-08 NOTE — Progress Notes (Signed)
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    GOAL:   Patient will meet greater than or equal to 90% of their needs    MONITOR:   PO intake, Supplement acceptance, Labs, Weight trends, I & O's  REASON FOR ASSESSMENT:   Consult Assessment of nutrition requirement/status  ASSESSMENT:   Pt with PMH of chronic respiratory failure on 4L O2 at home, COPD, bipolar disorder, seizure disorder and CVA w/ residual R sided weakness presented with dyspnea and wheezing due to COPD exacerbation and pneumonia.  Pt is concerned over caring for himself at home as his wife will be going for knee surgery and pt currently uses a wheelchair for ambulation and requires assistance for ADLs. Pt interested in d/c to [facility] Social Worker is working with pt.   Pt reports he had a very poor appetite for 3-4 months following his previous CVA (~1 year ago). His appetite is improving, but has not yet returned to baseline. Pt has been eating bacon, eggs, and toast for breakfast and some type of protein, starch, and vegetable for dinner. Encouraged pt to begin eating lunch; pt agreeable and motivated to do so as he wishes to return to his previous weight. Pt drinks 2 Boosts per day at home and is agreeable to Ensure while admitted (prefers strawberry and vanilla).   Pt would benefit from having diet liberalized to regular (initially ordered as carb modified) as he is malnourished with increased  kcal/protein needs. Discussed pt with MD who is agreeable with plan. Of note, pt's insulin regimen will likely need to be adjusted to cover additional carbohydrate consumption. Additionally, pt is taking steroids which raise blood sugar.   Pt reports having lost weight after the initial stroke and that he had dropped to 110lbs. Pt has since been able to gain wt and reports his new UBW is127 lbs. This pattern of wt loss and wt gain is not reflected in the wt readings. Recommend obtaining new wt as wt from this admission (54.4 kg) appears to be reported/estimated.   No PO intake documented. Observed meal tray in room, about 60% completion.   No UOP documented.  Labs: CBGs 160-204 Medications: ss novolog, remeron, deltasone   NUTRITION - FOCUSED PHYSICAL EXAM:    Most Recent Value  Orbital Region No depletion  Upper Arm Region Mild depletion  Thoracic and Lumbar Region Mild depletion  Buccal Region Mild depletion  Temple Region Mild depletion  Clavicle Bone Region Moderate depletion  Clavicle and Acromion Bone Region Moderate depletion  Scapular Bone Region Mild depletion  Dorsal Hand Mild depletion  Patellar Region Moderate depletion  Anterior Thigh Region Moderate depletion  Posterior Calf Region Moderate depletion  Edema (RD Assessment) None  Hair Reviewed  Eyes Reviewed  Mouth Reviewed  Skin Reviewed  Nails Reviewed       Diet Order:   Diet Order            Diet regular Room service appropriate? Yes; Fluid consistency: Thin  Diet effective now  EDUCATION NEEDS:   No education needs have been identified at this time  Skin:  Skin Assessment: Reviewed RN Assessment  Last BM:  PTA  Height:   Ht Readings from Last 1 Encounters:  01/06/20 5\' 5"  (1.651 m)    Weight:   Wt Readings from Last 1 Encounters:  01/06/20 54.4 kg    BMI:  Body mass index is 19.97 kg/m.  Estimated Nutritional Needs:   Kcal:  1700-1900  Protein:  85-95  grams  Fluid:  >/=1.7L/d    13/02/21, MS, RD, LDN RD pager number and weekend/on-call pager number located in [place].

## 2020-01-08 NOTE — Plan of Care (Signed)

## 2020-01-08 NOTE — Progress Notes (Signed)
Physical Therapy Treatment Patient Details Name: Brian Lucero MRN: [id number] DOB: 1970/03/21 Today's Date: 01/08/2020    History of Present Illness Pt is a 49 y/o male admitted secondary to COPD exacerbation. PMH includes COPD on 4L O2, CVA with R sided deficits, LUE congential abnormality, Bipolar disorder, and HTN.     PT Comments    Patient is very pleasant and motivated to work with PT. Received in recliner. Patient states he wants to use regular RW, not EVA walker. He has velcro strap to help hold R UE to walker. Patient requires min guard for sit to stand, static standing balance is fair to good. He is able to maintain balance while RUE is strapped to walker. Ambulated 200 feet with frequent rest breaks to re-adjust hand on walker. Had one lob when right hand slipped off walker grip. He told me he did not use platform walker prior to admission. I thought I read that somewhere. He would probably benefit from platform on right. Fatigued after walking this distance.  Patient will continue to benefit from skilled PT while here to improve functional independence and safety with mobility to reduce caregiver burden.        Follow Up Recommendations  SNF;Supervision for mobility/OOB     Equipment Recommendations  None recommended by PT    Recommendations for Other Services       Precautions / Restrictions Precautions Precautions: Fall Restrictions Weight Bearing Restrictions: No    Mobility  Bed Mobility               General bed mobility comments: patient received up in recliner  Transfers Overall transfer level: Needs assistance Equipment used: Rolling walker (2 wheeled) Transfers: Sit to/from Stand Sit to Stand: Min guard         General transfer comment: Patient did not want to use the eva walker this date, wanted to use RW.  Ambulation/Gait Ambulation/Gait assistance: Min guard;Min assist Gait Distance (Feet): 200 Feet Assistive device: Rolling walker (2  wheeled) Gait Pattern/deviations: Step-to pattern;Decreased stride length;Decreased step length - right;Decreased step length - left;Trunk flexed;Scissoring Gait velocity: decreased   General Gait Details: patient ambulates with slow gait, almost scissoring pattern. Right UE needs to be fastened to RW or it will fall off. Used velcro strap for this, but was not real successful in keeping his hand on walker. had to continuously re-adjust. Patient with good tolerance, Will lose balance if right UE slips off walker.   Stairs             Wheelchair Mobility    Modified Rankin (Stroke Patients Only)       Balance Overall balance assessment: Needs assistance Sitting-balance support: Feet supported Sitting balance-Leahy Scale: Good     Standing balance support: Bilateral upper extremity supported;During functional activity Standing balance-Leahy Scale: Fair Standing balance comment: heavy reliance on BUE support and min assist                            Cognition Arousal/Alertness: Awake/alert Behavior During Therapy: WFL for tasks assessed/performed Overall Cognitive Status: Within Functional Limits for tasks assessed                                        Exercises Other Exercises Other Exercises: LAQ in sitting B LE x 10-15    General Comments  Pertinent Vitals/Pain Pain Assessment: No/denies pain    Home Living                      Prior Function            PT Goals (current goals can now be found in the care plan section) Acute Rehab PT Goals Patient Stated Goal: to go to SNF after d/c  PT Goal Formulation: With patient Time For Goal Achievement: 01/20/20 Potential to Achieve Goals: Good Progress towards PT goals: Progressing toward goals    Frequency    Min 2X/week      PT Plan Current plan remains appropriate    Co-evaluation              AM-PAC PT "6 Clicks" Mobility   Outcome Measure   Help needed turning from your back to your side while in a flat bed without using bedrails?: A Little Help needed moving from lying on your back to sitting on the side of a flat bed without using bedrails?: A Little Help needed moving to and from a bed to a chair (including a wheelchair)?: A Lot Help needed standing up from a chair using your arms (e.g., wheelchair or bedside chair)?: A Little Help needed to walk in hospital room?: A Lot Help needed climbing 3-5 steps with a railing? : Total 6 Click Score: 14    End of Session Equipment Utilized During Treatment: Gait belt;Oxygen Activity Tolerance: Patient tolerated treatment well Patient left: in chair;with call bell/phone within reach Nurse Communication: Mobility status PT Visit Diagnosis: Unsteadiness on feet (R26.81);Muscle weakness (generalized) (M62.81);Difficulty in walking, not elsewhere classified (R26.2);Hemiplegia and hemiparesis Hemiplegia - Right/Left: Right Hemiplegia - caused by: Cerebral infarction     Time: 1410-1433 PT Time Calculation (min) (ACUTE ONLY): 23 min  Charges:  $Gait Training: 23-37 mins                     Aydia Maj, PT, GCS 01/08/20,3:48 PM

## 2020-01-08 NOTE — Plan of Care (Signed)
A/Ox4, LCTA but diminished throughtout. Good appetite. Up x1 assist with use of walker. No PRN medication given so far this shift.   Problem: Education: Goal: Knowledge of General Education information will improve Description: Including pain rating scale, medication(s)/side effects and non-pharmacologic comfort measures Outcome: Progressing   Problem: Health Behavior/Discharge Planning: Goal: Ability to manage health-related needs will improve Outcome: Progressing   Problem: Clinical Measurements: Goal: Ability to maintain clinical measurements within normal limits will improve Outcome: Progressing Goal: Will remain free from infection Outcome: Progressing Goal: Diagnostic test results will improve Outcome: Progressing Goal: Respiratory complications will improve Outcome: Progressing Goal: Cardiovascular complication will be avoided Outcome: Progressing   Problem: Activity: Goal: Risk for activity intolerance will decrease Outcome: Progressing   Problem: Nutrition: Goal: Adequate nutrition will be maintained Outcome: Progressing   Problem: Coping: Goal: Level of anxiety will decrease Outcome: Progressing   Problem: Elimination: Goal: Will not experience complications related to bowel motility Outcome: Progressing Goal: Will not experience complications related to urinary retention Outcome: Progressing   Problem: Pain Managment: Goal: General experience of comfort will improve Outcome: Progressing   Problem: Safety: Goal: Ability to remain free from injury will improve Outcome: Progressing   Problem: Skin Integrity: Goal: Risk for impaired skin integrity will decrease Outcome: Progressing

## 2020-01-08 NOTE — Progress Notes (Signed)
   Subjective:  Patient evaluated at bedside this AM. Sitting up in bedside chair. Wants lotion and strap for walker. Notes some improvement in breathing. Discussed discharge planning and his request to go to [facility] in [place].   Objective:  Vital signs in last 24 hours: Vitals:   01/07/20 0841 01/07/20 0925 01/07/20 2053 01/07/20 2202  BP:  (!) 116/91  (!) 151/100  Pulse: 86 89  97  Resp: 17 16  17   Temp:  98.3 F (36.8 C)  98 F (36.7 C)  TempSrc:  Oral  Oral  SpO2: 99% 98% 98% 100%  Weight:      Height:       Physical Exam: General: Pleasant, no acute distress Pulm: End-expiratory wheezing present MSK: Congenital left upper extremity deformity  Assessment/Plan: Brian Lucero is 49yo male with COPD with home oxygen requirement of 3L, previous CVA with residual right-sided weakness, bipolar disorder, seizure disorder admitted 11/2 for community-acquired pneumonia.  Principal Problem:   CAP (community acquired pneumonia) Active Problems:   Hypertension   COPD exacerbation (HCC)  #Community-Acquired Pnuemonia #COPD Patient reports some improvement of breathing, although continues to endorse dyspnea. States he is on 3L O2 at home. Currently sating well on 4L O2. CBG's elevated in setting of steroid therapy, will add sliding scale. Previously only taking albuterol, can consider adding maintenance inhaler on discharge. - Prednisone 40 (day 3/5) - CBG monitoring - Rocephin, azithromycin (day 3) - Duonebs, Incruse - Flutter valve  #Hx CVA Patient reports desire to be placed at [facility] in [place] for rehabilitation given his caretaker at home is undergoing surgery today. PT/OT recommending SNF. Per CSW, [facility] has offered bed, now pending insurance auth. - PT/OT  DIET: CM IVF: n/a DVT PPX: Lovenox BOWEL: Miralax CODE: [id number]  Prior to Admission Living Arrangement: Home Anticipated Discharge Location: SNF Barriers to Discharge: medical  management Dispo: Anticipated discharge in approximately 1-2 day(s).   [facility], MD 01/08/2020, 5:48 AM Pager: [phone] After 5pm on weekdays and 1pm on weekends: On Call pager [phone]

## 2020-01-08 NOTE — TOC Progression Note (Signed)
Transition of Care [facility]) - Progression Note    Patient Details  Name: Brian Lucero MRN: [id number] Date of Birth: 1970/05/26  Transition [facility]) CM/SW Contact  Lorri Frederick, LCSW Phone Number: 01/08/2020, 1:49 PM  Clinical Narrative:   CSW spoke with Revonda Standard at [facility] who does offer bed.  Pt wants [place] location.  Insurance auth submitted.  Requested covid test from MD.    Expected Discharge Plan: Skilled Nursing Facility Barriers to Discharge: Continued Medical Work up, SNF Pending bed offer  Expected Discharge Plan and Services Expected Discharge Plan: Skilled Nursing Facility     Post Acute Care Choice: Skilled Nursing Facility Living arrangements for the past 2 months: Single Family Home                                       Social Determinants of Health (SDOH) Interventions    Readmission Risk Interventions No flowsheet data found.

## 2020-01-08 NOTE — Progress Notes (Signed)
PT Cancellation Note  Patient Details Name: Brian Lucero MRN: [id number] DOB: 1970/09/08   Cancelled Treatment:    Reason Eval/Treat Not Completed: Medical issues which prohibited therapy. Patient BP is 181/115 this am. Will re-attempt later if medically appropriate.    Yesmin Mutch 01/08/2020, 10:40 AM

## 2020-01-09 ENCOUNTER — Telehealth: Payer: Self-pay | Admitting: *Deleted

## 2020-01-09 LAB — GLUCOSE, CAPILLARY
Glucose-Capillary: 111 mg/dL — ABNORMAL HIGH (ref 70–99)
Glucose-Capillary: 283 mg/dL — ABNORMAL HIGH (ref 70–99)

## 2020-01-09 MED ORDER — ADULT MULTIVITAMIN W/MINERALS CH: 1.0000 | ORAL_TABLET | Freq: Every day | ORAL | 0 refills | Status: AC

## 2020-01-09 MED ORDER — UMECLIDINIUM BROMIDE 62.5 MCG/INH IN AEPB: 1.0000 | INHALATION_SPRAY | Freq: Every day | RESPIRATORY_TRACT | 0 refills | Status: AC

## 2020-01-09 MED ORDER — AZITHROMYCIN 250 MG PO TABS: 500.0000 mg | ORAL_TABLET | Freq: Once | ORAL | 0 refills | Status: DC

## 2020-01-09 MED ORDER — AZITHROMYCIN 250 MG PO TABS: 250.0000 mg | ORAL_TABLET | Freq: Every day | ORAL | 0 refills | Status: AC

## 2020-01-09 MED ORDER — ENSURE ENLIVE PO LIQD: 237.0000 mL | Freq: Two times a day (BID) | ORAL | 12 refills | Status: AC

## 2020-01-09 MED ORDER — INSULIN ASPART 100 UNIT/ML ~~LOC~~ SOLN
0.0000 [IU] | Freq: Three times a day (TID) | SUBCUTANEOUS | Status: DC
  Administered 2020-01-09: 11 [IU] via SUBCUTANEOUS

## 2020-01-09 MED ORDER — CEFDINIR 300 MG PO CAPS: 600.0000 mg | ORAL_CAPSULE | Freq: Every day | ORAL | 0 refills | Status: DC

## 2020-01-09 MED ORDER — PREDNISONE 20 MG PO TABS
40.0000 mg | ORAL_TABLET | Freq: Every day | ORAL | 0 refills | Status: DC
Start: 2020-01-10 — End: 2020-03-09

## 2020-01-09 MED ORDER — CEFDINIR 300 MG PO CAPS: 600.0000 mg | ORAL_CAPSULE | Freq: Every day | ORAL | 0 refills | Status: AC

## 2020-01-09 NOTE — Plan of Care (Signed)
  Problem: Education: Goal: Knowledge of General Education information will improve Description: Including pain rating scale, medication(s)/side effects and non-pharmacologic comfort measures 01/09/2020 1947 by Valrie Hart, RN Outcome: Adequate for Discharge 01/09/2020 1939 by Valrie Hart, RN Outcome: Progressing   Problem: Health Behavior/Discharge Planning: Goal: Ability to manage health-related needs will improve 01/09/2020 1947 by Valrie Hart, RN Outcome: Adequate for Discharge 01/09/2020 1939 by Valrie Hart, RN Outcome: Progressing   Problem: Clinical Measurements: Goal: Ability to maintain clinical measurements within normal limits will improve 01/09/2020 1947 by Valrie Hart, RN Outcome: Adequate for Discharge 01/09/2020 1939 by Valrie Hart, RN Outcome: Progressing Goal: Will remain free from infection 01/09/2020 1947 by Valrie Hart, RN Outcome: Adequate for Discharge 01/09/2020 1939 by Valrie Hart, RN Outcome: Progressing Goal: Diagnostic test results will improve 01/09/2020 1947 by Valrie Hart, RN Outcome: Adequate for Discharge 01/09/2020 1939 by Valrie Hart, RN Outcome: Progressing Goal: Respiratory complications will improve 01/09/2020 1947 by Valrie Hart, RN Outcome: Adequate for Discharge 01/09/2020 1939 by Valrie Hart, RN Outcome: Progressing Goal: Cardiovascular complication will be avoided 01/09/2020 1947 by Valrie Hart, RN Outcome: Adequate for Discharge 01/09/2020 1939 by Valrie Hart, RN Outcome: Progressing   Problem: Activity: Goal: Risk for activity intolerance will decrease 01/09/2020 1947 by Valrie Hart, RN Outcome: Adequate for Discharge 01/09/2020 1939 by Valrie Hart, RN Outcome: Progressing   Problem: Nutrition: Goal: Adequate nutrition will be maintained 01/09/2020 1947 by Valrie Hart, RN Outcome: Adequate for Discharge 01/09/2020 1939 by Valrie Hart, RN Outcome: Progressing   Problem:  Coping: Goal: Level of anxiety will decrease 01/09/2020 1947 by Valrie Hart, RN Outcome: Adequate for Discharge 01/09/2020 1939 by Valrie Hart, RN Outcome: Progressing   Problem: Elimination: Goal: Will not experience complications related to bowel motility 01/09/2020 1947 by Valrie Hart, RN Outcome: Adequate for Discharge 01/09/2020 1939 by Valrie Hart, RN Outcome: Progressing Goal: Will not experience complications related to urinary retention 01/09/2020 1947 by Valrie Hart, RN Outcome: Adequate for Discharge 01/09/2020 1939 by Valrie Hart, RN Outcome: Progressing   Problem: Pain Managment: Goal: General experience of comfort will improve 01/09/2020 1947 by Valrie Hart, RN Outcome: Adequate for Discharge 01/09/2020 1939 by Valrie Hart, RN Outcome: Progressing   Problem: Safety: Goal: Ability to remain free from injury will improve 01/09/2020 1947 by Valrie Hart, RN Outcome: Adequate for Discharge 01/09/2020 1939 by Valrie Hart, RN Outcome: Progressing   Problem: Skin Integrity: Goal: Risk for impaired skin integrity will decrease 01/09/2020 1947 by Valrie Hart, RN Outcome: Adequate for Discharge 01/09/2020 1939 by Valrie Hart, RN Outcome: Progressing

## 2020-01-09 NOTE — TOC Progression Note (Signed)
Transition of Care [facility]) - Progression Note    Patient Details  Name: Brian Lucero MRN: [id number] Date of Birth: [birth year]  Transition of Care [facility]) CM/SW Contact  Mearl Latin, LCSW Phone Number: 01/09/2020, 9:36 AM  Clinical Narrative:    Insurance approval received for [facility]: [id number], effective 11/4-11/8. [facility] Liaison working to obtain COVID vaccine card from patient's spouse. CSW notified MD that patient can discharge today.    Expected Discharge Plan: Skilled Nursing Facility Barriers to Discharge: Continued Medical Work up, SNF Pending bed offer  Expected Discharge Plan and Services Expected Discharge Plan: Skilled Nursing Facility     Post Acute Care Choice: Skilled Nursing Facility Living arrangements for the past 2 months: Single Family Home                                       Social Determinants of Health (SDOH) Interventions    Readmission Risk Interventions No flowsheet data found.

## 2020-01-09 NOTE — Progress Notes (Signed)
   Subjective:  Patient evaluated at bedside this AM. States he is feeling good this morning, was able to get up and walk to bathroom with assistance of walker. Dyspnea continues to improve. Mentions wife's surgery went well and was able to speak to her. Ready to be discharged to SNF today.  Objective:  Vital signs in last 24 hours: Vitals:   01/08/20 2040 01/08/20 2127 01/09/20 0604 01/09/20 0634  BP:  133/85 (!) 148/92   Pulse:  91 78   Resp:  19 19   Temp:  98.1 F (36.7 C) 98.1 F (36.7 C)   TempSrc:  Oral Oral   SpO2: 100% 100% 100%   Weight:    55.4 kg  Height:       Physical Exam: General: Pleasant, no acute distress Pulm: No increased work of breathing MSK: Thin, muscles appear atrophied. Neuro: Awake, alert, oriented x3  Assessment/Plan: Mr. Gust is 49yo male with COPD on home oxygen requirement of 3L, previous CVA with residual right-sided weakness, bipolar disorder, seizure disorder admitted 11/2 for community-acquired pneumonia now improved and back on home oxygen requirement.  Principal Problem:   CAP (community acquired pneumonia) Active Problems:   Hypertension   COPD exacerbation (HCC)   Malnutrition of moderate degree  #Community-Acquired PNA #COPD Patient continues to improve, reports he is ready for discharge. Able to be weaned down to his home oxygen requirement of 3L. Will continue antibiotics for one more day for a total of 5 days. Will continue Incruse on discharge for maintenance inhaler. Plan for patient to follow-up with PCP for further management of COPD. -Prednisone 40 (day 4/5) -Rocephin, azithromycin (day 4) -Incruse, Duonebs -Flutter valve  #Hx CVA Patient reports he was able to move around some more today with assistance of walker. He has accepted bed at [facility] in [place] with authorization from insurance. Plan for discharge today. -C/w PT/OT at rehabilitation facility  DIET: CM IVF: n/a DVT PPX: Lovenox BOWEL:  Miralax CODE: FULL  Prior to Admission Living Arrangement: Home Anticipated Discharge Location: SNF Barriers to Discharge: none Dispo: Anticipated discharge in approximately 0 day(s).   Evlyn Kanner, MD 01/09/2020, 6:39 AM Pager: [phone] After 5pm on weekdays and 1pm on weekends: On Call pager [phone]

## 2020-01-09 NOTE — Progress Notes (Signed)
Patient picked up by [facility] transport to go to [facility].

## 2020-01-09 NOTE — Plan of Care (Signed)

## 2020-01-09 NOTE — Discharge Summary (Addendum)
Name: Brian Lucero MRN: [id number] DOB: 01-11-1971 49 y.o. PCP: Lucius Conn, NP  Date of Admission: 01/06/2020 10:30 AM Date of Discharge: 01/09/2020 Attending Physician: Tyson Alias, *  Discharge Diagnosis: 1. Community-acquired pneumonia  2. COPD 3. Hx CVA w/ residual right-sided weakness  Discharge Medications: Allergies as of 01/09/2020      Reactions   Levofloxacin Swelling, Other (See Comments)   Lisinopril Swelling, Other (See Comments)      Medication List    STOP taking these medications   risperiDONE 0.5 MG tablet Commonly known as: RISPERDAL   traZODone 50 MG tablet Commonly known as: DESYREL     TAKE these medications   albuterol 108 (90 Base) MCG/ACT inhaler Commonly known as: VENTOLIN HFA Inhale 2 puffs into the lungs every 6 (six) hours as needed for wheezing.   amLODipine 10 MG tablet Commonly known as: NORVASC Take 1 tablet (10 mg total) by mouth daily. For high blood pressure   atorvastatin 40 MG tablet Commonly known as: LIPITOR Take 40 mg by mouth at bedtime.   azithromycin 250 MG tablet Commonly known as: ZITHROMAX Take 1 tablet (250 mg total) by mouth daily for 1 day.   cefdinir 300 MG capsule Commonly known as: OMNICEF Take 2 capsules (600 mg total) by mouth daily for 1 day. Start taking on: January 10, 2020   divalproex 250 MG 24 hr tablet Commonly known as: DEPAKOTE ER Take 3 tablets (750 mg total) by mouth at bedtime. For mood stabilization What changed:   how much to take  when to take this  additional instructions   feeding supplement Liqd Take 237 mLs by mouth 2 (two) times daily between meals.   FLUoxetine 20 MG capsule Commonly known as: PROZAC Take 1 capsule (20 mg total) by mouth daily. For depression What changed: additional instructions   gabapentin 300 MG capsule Commonly known as: NEURONTIN Take 300 mg by mouth 3 (three) times daily. What changed: Another medication with the same name was  removed. Continue taking this medication, and follow the directions you see here.   hydrOXYzine 25 MG tablet Commonly known as: ATARAX/VISTARIL Take 1 tablet (25 mg total) by mouth every 6 (six) hours as needed for anxiety.   levETIRAcetam 250 MG tablet Commonly known as: KEPPRA Take 250 mg by mouth 2 (two) times daily.   Lumigan 0.01 % Soln Generic drug: bimatoprost Place 1 drop into both eyes at bedtime.   metFORMIN 500 MG 24 hr tablet Commonly known as: GLUCOPHAGE-XR Take 500 mg by mouth daily with breakfast.   metoprolol succinate 25 MG 24 hr tablet Commonly known as: TOPROL-XL Take 25 mg by mouth daily.   mirtazapine 7.5 MG tablet Commonly known as: REMERON Take 7.5 mg by mouth at bedtime.   multivitamin with minerals Tabs tablet Take 1 tablet by mouth daily. Start taking on: January 10, 2020   nicotine 21 mg/24hr patch Commonly known as: NICODERM CQ - dosed in mg/24 hours Place 1 patch (21 mg total) onto the skin daily. For smoking cessation   OXYGEN Inhale 4 L/min into the lungs continuous.   predniSONE 20 MG tablet Commonly known as: DELTASONE Take 2 tablets (40 mg total) by mouth daily with breakfast. Start taking on: January 10, 2020   sertraline 100 MG tablet Commonly known as: ZOLOFT Take 100 mg by mouth daily.   umeclidinium bromide 62.5 MCG/INH Aepb Commonly known as: INCRUSE ELLIPTA Inhale 1 puff into the lungs daily. Start taking on: January 10, 2020       Disposition and follow-up:   [name] was discharged from [facility] in Stable condition.  At the hospital follow up visit please address:  1.  CAP: Patient improved with 5 days of antibiotics (azithromycin, ceftriaxone) and prednisone. Back on home oxygen requirement.       COPD: Prior to arrival patient taking albuterol multiple times per day. Added Incruse for maintenance inhaler. Discussed patient should inform PCP if still needing albuterol multiple times per  day.  2.  Labs / imaging needed at time of follow-up: CBC, BMP  3.  Pending labs/ test needing follow-up: n/a  Follow-up Appointments:  Contact information for follow-up providers    Dekoninck, Beth A, NP. Schedule an appointment as soon as possible for a visit in 1 week(s).   Specialty: General Practice Contact information: [address] [place] [address] [phone]            Contact information for after-discharge care    Destination    [facility] .   Service: Skilled Nursing Contact information: [address] [place] [address] [phone]                  Hospital Course by problem list: 1. Community-acquired pneumonia: Patient presenting with dyspnea, increased oxygen requirement. Denies fevers, productive cough. CXR with RML opacity concerning for CAP. Completed 4 days of antibiotics and steroids while inpatient, will complete one more day at SNF. Patient back to home oxygen requirement and stable for discharge.  2. COPD: Patient states he uses albuterol inhaler multiples times per day, no maintenance. Started on Incruse and discussed patient should discuss further with PCP if requiring albuterol multiple times per day.  3. Hx CVA: Patient worried about mobility/increasing strength given current infection and residual right-sided weakness. PT/OT recommending SNF, patient agreeable to [facility] in [place].  Discharge Vitals:   BP (!) 148/107   Pulse 89   Temp 98.6 F (37 C) (Oral)   Resp 19   Ht 5\' 5"  (1.651 m)   Wt 55.4 kg   SpO2 100%   BMI 20.32 kg/m   Pertinent Labs, Studies, and Procedures:  CBC Latest Ref Rng & Units 01/08/2020 01/07/2020 01/06/2020  WBC 4.0 - 10.5 K/uL 10.1 9.1 11.0(H)  Hemoglobin 13.0 - 17.0 g/dL 13/04/2019) 3.5(K) 11.0(L)  Hematocrit 39 - 52 % 29.1(L) 30.3(L) 36.1(L)  Platelets 150 - 400 K/uL 167 151 141(L)   BMP Latest Ref Rng & Units 01/08/2020 01/07/2020 01/06/2020  Glucose 70  - 99 mg/dL 13/04/2019) 563(S) 937(D)  BUN 6 - 20 mg/dL 428(J) 20 16  Creatinine 0.61 - 1.24 mg/dL 68(T 1.57 2.62  Sodium 135 - 145 mmol/L 140 140 141  Potassium 3.5 - 5.1 mmol/L 3.9 3.7 3.8  Chloride 98 - 111 mmol/L 102 101 99  CO2 22 - 32 mmol/L 25 29 31   Calcium 8.9 - 10.3 mg/dL 9.1 9.2 9.7   CXR 0.35: Opacity in RML suspicious for PNA, possibly 2/2 aspiration.  Discharge Instructions: Discharge Instructions    Call MD for:  difficulty breathing, headache or visual disturbances   Complete by: As directed    Call MD for:  extreme fatigue   Complete by: As directed    Call MD for:  hives   Complete by: As directed    Call MD for:  persistant dizziness or light-headedness   Complete by: As directed    Call MD for:  persistant  nausea and vomiting   Complete by: As directed    Call MD for:  redness, tenderness, or signs of infection (pain, swelling, redness, odor or green/yellow discharge around incision site)   Complete by: As directed    Call MD for:  severe uncontrolled pain   Complete by: As directed    Call MD for:  temperature >100.4   Complete by: As directed    Diet - low sodium heart healthy   Complete by: As directed    Discharge instructions   Complete by: As directed    Mr. Brian Lucero, I am glad you are feeling better! You were admitted because you had pneumonia. We have treated you with antibiotics and steroids. Thankfully you are feeling better and are ready for discharge to [facility]. Please see the following notes:  -You will need one more day (on 01/10/2020) of antibiotics and steroids. These include: Azithromycin (ZITHROMAX), 500mg  once Cefdinir (OMNICEF) 600mg  once Prednisone (DELTASONE) 40mg  once  -In addition, we are adding an additional inhaler to your regimen. The inhaler you have at home (albuterol) should be used as needed. This inhaler is a maintenance inhaler called Incruse. That means you will take 1 puff every day. This helps decrease your risk of future  exacerbations. Continue taking the albuterol as needed. If you are still needing to take the albuterol multiple times per day, talk to your primary care doctor about switching to a new inhaler.  -Continue taking your other home medications as you were before.  -Please make an appointment to see your primary care doctor within the next week.  It was a pleasure meeting you, Mr. Brian Lucero! I wish you the best and hope you stay happy and healthy!  Thank you, , MD   Increase activity slowly   Complete by: As directed      Mr. Brian Lucero, I am glad you are feeling better! You were admitted because you had pneumonia. We have treated you with antibiotics and steroids. Thankfully you are feeling better and are ready for discharge to [facility]. Please see the following notes:  -You will need one more day (on 01/10/2020) of antibiotics and steroids. These include: Azithromycin (ZITHROMAX), 250mg  once Cefdinir (OMNICEF) 600mg  once Prednisone (DELTASONE) 40mg  once  -In addition, we are adding an additional inhaler to your regimen. The inhaler you have at home (albuterol) should be used as needed. This inhaler is a maintenance inhaler called Incruse. That means you will take 1 puff every day. This helps decrease your risk of future exacerbations. Continue taking the albuterol as needed. If you are still needing to take the albuterol multiple times per day, talk to your primary care doctor about switching to a new inhaler.  -Continue taking your other home medications as you were before.  -Please make an appointment to see your primary care doctor within the next week.  It was a pleasure meeting you, Mr. Brian Lucero! I wish you the best and hope you stay happy and healthy!  Thank you, [facility], MD  Signed: 13/08/2019, MD 01/09/2020, 3:27 PM   Pager: [phone]

## 2020-01-09 NOTE — Progress Notes (Signed)
Awaiting Covid Test result as patient will be going to a rehab facility. The Covid test was performed by the say RN on 01/08/2020 and was sent to the lab. Awaiting covid result.

## 2020-01-09 NOTE — Telephone Encounter (Signed)
[facility] pharmacist has a question about zithromax sent electronically, gave him dr Marigene Ehlers [phone] # he will page if no response in 15 mins will call triage back

## 2020-01-09 NOTE — Telephone Encounter (Signed)
I'll give a call back thank you

## 2020-01-09 NOTE — TOC Transition Note (Signed)
Transition of Care [facility]) - CM/SW Discharge Note   Patient Details  Name: Brian Lucero MRN: [id number] Date of Birth: 09-05-1970  Transition of Care [facility]) CM/SW Contact:  Mearl Latin, LCSW Phone Number: 01/09/2020, 1:56 PM   Clinical Narrative:    Patient will DC to: [facility] Anticipated DC date: 01/09/20 Family notified: Declined Transport by: Sharin Mons   Per MD patient ready for DC to [facility]. RN to call report prior to discharge [phone]). RN, patient, patient's family, and facility notified of DC. Discharge Summary and FL2 sent to facility. DC packet on chart. Ambulance transport requested for patient.   CSW will sign off for now as social work intervention is no longer needed. Please consult [place] again if new needs arise.      Final next level of care: Skilled Nursing Facility Barriers to Discharge: Barriers Resolved   Patient Goals and CMS Choice Patient states their goals for this hospitalization and ongoing recovery are:: independance at home CMS Medicare.gov Compare Post Acute Care list provided to:: Patient Choice offered to / list presented to : Patient  Discharge Placement   Existing PASRR number confirmed : 01/09/20          Patient chooses bed at: [facility] Patient to be transferred to facility by: [facility] Name of family member notified: Declined Patient and family notified of of transfer: 01/09/20  Discharge Plan and Services     Post Acute Care Choice: Skilled Nursing Facility                               Social Determinants of Health (SDOH) Interventions     Readmission Risk Interventions No flowsheet data found.

## 2020-01-09 NOTE — Progress Notes (Signed)
Called report to [place] at the [facility].  Removed PIV. DC instructions in packet for receiving facility.  Waiting for [facility] at this time.

## 2020-01-11 LAB — CULTURE, BLOOD (ROUTINE X 2)
Culture: NO GROWTH
Culture: NO GROWTH
Special Requests: ADEQUATE
Special Requests: ADEQUATE

## 2020-02-12 ENCOUNTER — Ambulatory Visit: Payer: Medicare Other | Admitting: Gastroenterology

## 2020-03-08 NOTE — Progress Notes (Signed)
Primary Care Physician:  Lucius Conn, NP  Primary GI: Dr. Marletta Lor  Patient Location: Home   Provider Location: [facility] office   Reason for Visit: Evaluation for microcytic anemia    Persons present on the virtual encounter, with roles: Patient and NP   Total time (minutes) spent on medical discussion: 20 minutes   Due to COVID-19, visit was conducted using virtual method.  Visit was requested by patient.  Virtual Visit via MyChart Video Note Due to COVID-19, visit is conducted virtually and was requested by patient.   I connected with Brian Lucero on 03/09/20 at  8:30 AM EST by telephone and verified that I am speaking with the correct person using two identifiers.   I discussed the limitations, risks, security and privacy concerns of performing an evaluation and management service by telephone and the availability of in person appointments. I also discussed with the patient that there may be a patient responsible charge related to this service. The patient expressed understanding and agreed to proceed.  Chief Complaint  Patient presents with   Anemia    Consult for EGD     History of Present Illness: Brian Lucero is a 50 year old male presenting today at the request of Therisa Doyne, FNP, due to anemia. Labs from Oct 2021 with Hgb 10.7, Hct 33.9, microcytic pattern. No iron studies. Creatinine 0.85, BUN 11., LFTs normal.  Hgb 9.1 in Nov 2021. Unknown baseline.    [facility] resident. Rehab. Present since November 2021. No abdominal pain. No constipation. No diarrhea. No overt GI bleeding. Great appetite when it's what he wants to eat. No dysphagia. No weight loss.     Uses wheelchair for long distances.   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 No known prior EGD or colonoscopy.   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   divalproex (DEPAKOTE ER) 250 MG 24 hr tablet Take 3 tablets (750 mg total) by mouth at bedtime. For mood stabilization (Patient taking differently: Take 500 mg by mouth daily.)   feeding supplement (ENSURE ENLIVE / ENSURE PLUS) LIQD Take 237 mLs by mouth 2 (two) times daily between meals.   gabapentin (NEURONTIN) 300 MG capsule Take 300 mg by mouth 3 (three) times daily.   levETIRAcetam (KEPPRA) 250 MG tablet Take 250 mg by mouth 2 (two) times daily.   LUMIGAN 0.01 % SOLN Place 1 drop into both eyes at bedtime.    metoprolol succinate (TOPROL-XL) 25 MG 24 hr tablet Take 25 mg by mouth daily.   mirtazapine (REMERON) 7.5 MG tablet Take 7.5 mg by mouth at bedtime.   Multiple Vitamin (MULTIVITAMIN WITH MINERALS) TABS tablet Take 1 tablet by mouth daily.   OXYGEN Inhale 4 L/min into the lungs continuous.   sertraline (ZOLOFT) 100 MG tablet Take 100 mg by mouth daily.   umeclidinium bromide (INCRUSE ELLIPTA) 62.5 MCG/INH AEPB Inhale 1 puff into the  lungs daily.     Family History  Problem Relation Age of Onset   Diabetes type II Mother    Diabetes type II Other    Colon cancer Neg Hx    Colon polyps Neg Hx     Social History   Socioeconomic History   Marital status: Married    Spouse name: Not on file   Number of children: Not on file   Years of education: Not on file   Highest education level: Not on file  Occupational History   Not on file  Tobacco Use   Smoking status: Former Smoker    Packs/day: 0.20    Types: Cigarettes   Smokeless tobacco: Never Used   Tobacco comment: quit 03/05/2019  Substance and Sexual Activity   Alcohol use: Not  Currently    Alcohol/week: 2.0 standard drinks    Types: 2 Cans of beer per week    Comment: quit drinking 03/05/2019   Drug use: Not Currently    Types: Cocaine    Comment: quit 03/05/2019   Sexual activity: Yes  Other Topics Concern   Not on file  Social History Narrative   Not on file   Social Determinants of Health   Financial Resource Strain: Not on file  Food Insecurity: Not on file  Transportation Needs: Not on file  Physical Activity: Not on file  Stress: Not on file  Social Connections: Not on file       Review of Systems: Gen: Denies fever, chills, anorexia. Denies fatigue, weakness, weight loss.  CV: Denies chest pain, palpitations, syncope, peripheral edema, and claudication. Resp: +DOE GI: see HPI Derm: Denies rash, itching, dry skin Psych: Denies depression, anxiety, memory loss, confusion. No homicidal or suicidal ideation.  Heme: Denies bruising, bleeding, and enlarged lymph nodes.  Observations/Objective: No distress. Unable to perform physical exam due to video encounter. Maintains good eye contact. Conversant.   Assessment and Plan: 50 year old male with microcytic anemia, unknown baseline, presenting for colonoscopy/EGD. No iron studies on file. Appears to have good renal function. No prior EGD or colonoscopy that he is aware.   Will check iron [facility] at the [facility]. Pursue colonoscopy/EGD in near future. No overt GI bleeding reported.   Proceed with colonoscopy/EGD by Dr. Marletta Lor in near future: the risks, benefits, and alternatives have been discussed with the patient in detail. The patient states understanding and desires to proceed.  Return in 4 months for follow-up.    Follow Up Instructions:    I discussed the assessment and treatment plan with the patient. The patient was provided an opportunity to ask questions and all were answered. The patient agreed with the plan and demonstrated an understanding of the instructions.   The  patient was advised to call back or seek an in-person evaluation if the symptoms worsen or if the condition fails to improve as anticipated.  I provided 20 minutes of face-to-face time during this encounter.  Gelene Mink, PhD, ANP-BC [facility]

## 2020-03-09 ENCOUNTER — Encounter: Payer: Self-pay | Admitting: Gastroenterology

## 2020-03-09 ENCOUNTER — Telehealth: Payer: Self-pay | Admitting: *Deleted

## 2020-03-09 ENCOUNTER — Telehealth (INDEPENDENT_AMBULATORY_CARE_PROVIDER_SITE_OTHER): Payer: Medicare Other | Admitting: Gastroenterology

## 2020-03-09 DIAGNOSIS — D509 Iron deficiency anemia, unspecified: Secondary | ICD-10-CM | POA: Diagnosis not present

## 2020-03-09 NOTE — Telephone Encounter (Signed)
Egbert Garibaldi, you are scheduled for a virtual visit with your provider today.  Just as we do with appointments in the office, we must obtain your consent to participate.  Your consent will be active for this visit and any virtual visit you may have with one of our providers in the next 365 days.  If you have a MyChart account, I can also send a copy of this consent to you electronically.  All virtual visits are billed to your insurance company just like a traditional visit in the office.  As this is a virtual visit, video technology does not allow for your provider to perform a traditional examination.  This may limit your provider's ability to fully assess your condition.  If your provider identifies any concerns that need to be evaluated in person or the need to arrange testing such as labs, EKG, etc, we will make arrangements to do so.  Although advances in technology are sophisticated, we cannot ensure that it will always work on either your end or our end.  If the connection with a video visit is poor, we may have to switch to a telephone visit.  With either a video or telephone visit, we are not always able to ensure that we have a secure connection.   I need to obtain your verbal consent now.   Are you willing to proceed with your visit today?

## 2020-03-09 NOTE — Patient Instructions (Signed)
Please have blood work done at the [facility].  We are arranging a colonoscopy and upper endoscopy in the near future.  We will see you in the clinic in 4 months!   It was a pleasure to see you today. I want to create trusting relationships with patients to provide genuine, compassionate, and quality care. I value your feedback. If you receive a survey regarding your visit,  I greatly appreciate you taking time to fill this out.   Gelene Mink, PhD, ANP-BC [facility]

## 2020-03-09 NOTE — Telephone Encounter (Signed)
Pt consented to a virtual visit. 

## 2020-03-11 ENCOUNTER — Other Ambulatory Visit: Payer: Self-pay

## 2020-03-11 ENCOUNTER — Telehealth: Payer: Self-pay

## 2020-03-11 NOTE — Telephone Encounter (Signed)
[facility], spoke to [place], TCS/EGD w/Propofol ASA 3 scheduled w/Dr. Marletta Lor 04/20/20 at 12:15pm. Pt to arrive at 9:15am for covid test prior to procedure d/t being nursing home pt. Fax# [phone]. Orders entered.  PA for EGD submitted via [facility] website. PA# [id number], valid 04/20/20-07/19/20. No PA needed for TCS from [facility] [employer].

## 2020-03-12 NOTE — Telephone Encounter (Signed)
Pre-op appt 04/14/20. Appt letter, procedure instructions, and prep prescription faxed to [facility].

## 2020-03-15 ENCOUNTER — Telehealth: Payer: Self-pay | Admitting: Gastroenterology

## 2020-03-15 NOTE — Addendum Note (Signed)
Addended by: Gelene Mink on: 03/15/2020 03:24 PM   Modules accepted: Level of Service

## 2020-03-15 NOTE — Telephone Encounter (Signed)
Darl Pikes, can we see if the [facility] completed iron studies as requested from visit? Thanks!

## 2020-03-15 NOTE — Telephone Encounter (Signed)
Requested labs (iron studies) from [facility]

## 2020-03-15 NOTE — Progress Notes (Signed)
Cc'ed to pcp °

## 2020-04-13 NOTE — Patient Instructions (Signed)
Brian Lucero  04/13/2020     @PREFPERIOPPHARMACY @   Your procedure is scheduled on  04/20/2020.   Report to 04/22/2020 at  0915  A.M.   Call this number if you have problems the morning of surgery:  [phone]   Remember:  Follow the diet and prep instructions given to you by the office.                      Take these medicines the morning of surgery with A SIP OF WATER    Buspar, depakote, gabapentin, keppra, metoprolol, zoloft.    Please brush your teeth.  Do not wear jewelry, make-up or nail polish.  Do not wear lotions, powders, or perfumes, or deodorant.  Do not shave 48 hours prior to surgery.  Men may shave face and neck.  Do not bring valuables to the hospital.  [facility] is not responsible for any belongings or valuables.  Contacts, dentures or bridgework may not be worn into surgery.  Leave your suitcase in the car.  After surgery it may be brought to your room.  For patients admitted to the hospital, discharge time will be determined by your treatment team.  Patients discharged the day of surgery will not be allowed to drive home and must have someone with them for 24 hours.   Special instructions:  DO NOT smoke tobacco or vape the morning of your procedure.  Please read over the following fact sheets that you were given. Anesthesia Post-op Instructions and Care and Recovery After Surgery       Upper Endoscopy, Adult, Care After This sheet gives you information about how to care for yourself after your procedure. Your health care provider may also give you more specific instructions. If you have problems or questions, contact your health care provider. What can I expect after the procedure? After the procedure, it is common to have:  A sore throat.  Mild stomach pain or discomfort.  Bloating.  Nausea. Follow these instructions at home:  Follow instructions from your health care provider about what to eat or drink after your  procedure.  Return to your normal activities as told by your health care provider. Ask your health care provider what activities are safe for you.  Take over-the-counter and prescription medicines only as told by your health care provider.  If you were given a sedative during the procedure, it can affect you for several hours. Do not drive or operate machinery until your health care provider says that it is safe.  Keep all follow-up visits as told by your health care provider. This is important.   Contact a health care provider if you have:  A sore throat that lasts longer than one day.  Trouble swallowing. Get help right away if:  You vomit blood or your vomit looks like coffee grounds.  You have: ? A fever. ? Bloody, black, or tarry stools. ? A severe sore throat or you cannot swallow. ? Difficulty breathing. ? Severe pain in your chest or abdomen. Summary  After the procedure, it is common to have a sore throat, mild stomach discomfort, bloating, and nausea.  If you were given a sedative during the procedure, it can affect you for several hours. Do not drive or operate machinery until your health care provider says that it is safe.  Follow instructions from your health care provider about what to eat or drink after your procedure.  Return to your normal activities as told by your health care provider. This information is not intended to replace advice given to you by your health care provider. Make sure you discuss any questions you have with your health care provider. Document Revised: 02/18/2019 Document Reviewed: 07/23/2017 Elsevier Patient Education  2021 [place].  Colonoscopy, Adult, Care After This sheet gives you information about how to care for yourself after your procedure. Your health care provider may also give you more specific instructions. If you have problems or questions, contact your health care provider. What can I expect after the procedure? After  the procedure, it is common to have:  A small amount of blood in your stool for 24 hours after the procedure.  Some gas.  Mild cramping or bloating of your abdomen. Follow these instructions at home: Eating and drinking  Drink enough fluid to keep your urine pale yellow.  Follow instructions from your health care provider about eating or drinking restrictions.  Resume your normal diet as instructed by your health care provider. Avoid heavy or fried foods that are hard to digest.   Activity  Rest as told by your health care provider.  Avoid sitting for a long time without moving. Get up to take short walks every 1-2 hours. This is important to improve blood flow and breathing. Ask for help if you feel weak or unsteady.  Return to your normal activities as told by your health care provider. Ask your health care provider what activities are safe for you. Managing cramping and bloating  Try walking around when you have cramps or feel bloated.  Apply heat to your abdomen as told by your health care provider. Use the heat source that your health care provider recommends, such as a moist heat pack or a heating pad. ? Place a towel between your skin and the heat source. ? Leave the heat on for 20-30 minutes. ? Remove the heat if your skin turns bright red. This is especially important if you are unable to feel pain, heat, or cold. You may have a greater risk of getting burned.   General instructions  If you were given a sedative during the procedure, it can affect you for several hours. Do not drive or operate machinery until your health care provider says that it is safe.  For the first 24 hours after the procedure: ? Do not sign important documents. ? Do not drink alcohol. ? Do your regular daily activities at a slower pace than normal. ? Eat soft foods that are easy to digest.  Take over-the-counter and prescription medicines only as told by your health care provider.  Keep all  follow-up visits as told by your health care provider. This is important. Contact a health care provider if:  You have blood in your stool 2-3 days after the procedure. Get help right away if you have:  More than a small spotting of blood in your stool.  Large blood clots in your stool.  Swelling of your abdomen.  Nausea or vomiting.  A fever.  Increasing pain in your abdomen that is not relieved with medicine. Summary  After the procedure, it is common to have a small amount of blood in your stool. You may also have mild cramping and bloating of your abdomen.  If you were given a sedative during the procedure, it can affect you for several hours. Do not drive or operate machinery until your health care provider says that it is safe.  Get help right away if you have a lot of blood in your stool, nausea or vomiting, a fever, or increased pain in your abdomen. This information is not intended to replace advice given to you by your health care provider. Make sure you discuss any questions you have with your health care provider. Document Revised: 02/14/2019 Document Reviewed: 09/16/2018 Elsevier Patient Education  2021 [place] After This sheet gives you information about how to care for yourself after your procedure. Your health care provider may also give you more specific instructions. If you have problems or questions, contact your health care provider. What can I expect after the procedure? After the procedure, it is common to have:  Tiredness.  Forgetfulness about what happened after the procedure.  Impaired judgment for important decisions.  Nausea or vomiting.  Some difficulty with balance. Follow these instructions at home: For the time period you were told by your health care provider:  Rest as needed.  Do not participate in activities where you could fall or become injured.  Do not drive or use machinery.  Do not drink  alcohol.  Do not take sleeping pills or medicines that cause drowsiness.  Do not make important decisions or sign legal documents.  Do not take care of children on your own.      Eating and drinking  Follow the diet that is recommended by your health care provider.  Drink enough fluid to keep your urine pale yellow.  If you vomit: ? Drink water, juice, or soup when you can drink without vomiting. ? Make sure you have little or no nausea before eating solid foods. General instructions  Have a responsible adult stay with you for the time you are told. It is important to have someone help care for you until you are awake and alert.  Take over-the-counter and prescription medicines only as told by your health care provider.  If you have sleep apnea, surgery and certain medicines can increase your risk for breathing problems. Follow instructions from your health care provider about wearing your sleep device: ? Anytime you are sleeping, including during daytime naps. ? While taking prescription pain medicines, sleeping medicines, or medicines that make you drowsy.  Avoid smoking.  Keep all follow-up visits as told by your health care provider. This is important. Contact a health care provider if:  You keep feeling nauseous or you keep vomiting.  You feel light-headed.  You are still sleepy or having trouble with balance after 24 hours.  You develop a rash.  You have a fever.  You have redness or swelling around the IV site. Get help right away if:  You have trouble breathing.  You have new-onset confusion at home. Summary  For several hours after your procedure, you may feel tired. You may also be forgetful and have poor judgment.  Have a responsible adult stay with you for the time you are told. It is important to have someone help care for you until you are awake and alert.  Rest as told. Do not drive or operate machinery. Do not drink alcohol or take sleeping  pills.  Get help right away if you have trouble breathing, or if you suddenly become confused. This information is not intended to replace advice given to you by your health care provider. Make sure you discuss any questions you have with your health care provider. Document Revised: 11/06/2019 Document Reviewed: 01/23/2019 Elsevier Patient Education  2021 [employer].

## 2020-04-14 ENCOUNTER — Encounter (HOSPITAL_COMMUNITY): Admission: RE | Admit: 2020-04-14 | Payer: Medicare Other | Source: Ambulatory Visit

## 2020-04-15 ENCOUNTER — Inpatient Hospital Stay (HOSPITAL_COMMUNITY)
Admission: RE | Admit: 2020-04-15 | Discharge: 2020-04-15 | Disposition: A | Discharge status: Home / Self Care | Payer: Medicare Other | Source: Ambulatory Visit | Attending: Internal Medicine | Admitting: Internal Medicine

## 2020-04-15 ENCOUNTER — Encounter (HOSPITAL_COMMUNITY)
Admission: RE | Admit: 2020-04-15 | Discharge: 2020-04-15 | Disposition: A | Discharge status: Home / Self Care | Payer: Medicare Other | Source: Ambulatory Visit | Attending: Internal Medicine | Admitting: Internal Medicine

## 2020-04-15 ENCOUNTER — Encounter (HOSPITAL_COMMUNITY): Payer: Self-pay

## 2020-04-15 NOTE — Pre-Procedure Instructions (Signed)
I called Tresa Endo from [facility] because patient had not shown for preop appointment. She states they  have not got the [place] back from the repair shop yet. Since patient has to have a rapid COIVD the morning of procedure and is arriving early for this, I told Tresa Endo we could do all his labs that morning when he comes for procedure. I verbally went over instructions and faxed a copy to her at [phone].

## 2020-04-15 NOTE — Pre-Procedure Instructions (Signed)
Tresa Endo from [facility] called Carloyn Jaeger, [place] scheduler to let her know that the transport Zenaida Niece was in the shop and they would not he able to make 1000 preop appointment. She states they should have the van back by lunch today, so appointment was moved to 1445 today.

## 2020-04-19 ENCOUNTER — Encounter (HOSPITAL_COMMUNITY): Payer: Self-pay | Admitting: Anesthesiology

## 2020-04-20 ENCOUNTER — Other Ambulatory Visit (HOSPITAL_COMMUNITY)
Admission: RE | Admit: 2020-04-20 | Discharge: 2020-04-20 | Disposition: A | Discharge status: Home / Self Care | Payer: Medicare Other | Source: Ambulatory Visit | Attending: Internal Medicine | Admitting: Internal Medicine

## 2020-04-20 ENCOUNTER — Encounter (HOSPITAL_COMMUNITY): Admission: RE | Payer: Self-pay | Source: Home / Self Care

## 2020-04-20 ENCOUNTER — Telehealth: Payer: Self-pay | Admitting: Internal Medicine

## 2020-04-20 ENCOUNTER — Ambulatory Visit (HOSPITAL_COMMUNITY): Admission: RE | Admit: 2020-04-20 | Payer: Medicare Other | Source: Home / Self Care

## 2020-04-20 SURGERY — COLONOSCOPY WITH PROPOFOL
Anesthesia: Monitor Anesthesia Care

## 2020-04-20 NOTE — Telephone Encounter (Signed)
Brian Lucero and he states patient is absolutely refusing to do the prep for TCS. Procedure was cancelled for today. FYI to [place].

## 2020-04-20 NOTE — Telephone Encounter (Signed)
Patient refused to take his tcs prep.  Please call Coralee Rud Day at the [place] center [phone]

## 2020-04-20 NOTE — Telephone Encounter (Signed)
Noted. Please arrange routine follow-up with [place] in 4-6 weeks.

## 2020-06-01 ENCOUNTER — Ambulatory Visit: Payer: Medicare Other | Admitting: Gastroenterology

## 2020-07-06 ENCOUNTER — Ambulatory Visit: Payer: Medicare Other | Admitting: Gastroenterology

## 2020-07-06 ENCOUNTER — Encounter: Payer: Self-pay | Admitting: Internal Medicine

## 2021-08-12 IMAGING — US US SCROTUM W/ DOPPLER COMPLETE
1 series · 14 of 25 positions shown · non-contrast
Comparison: None.

CLINICAL DATA: Left-sided scrotal pain for several days

EXAM:
SCROTAL ULTRASOUND
DOPPLER ULTRASOUND OF THE TESTICLES
TECHNIQUE: Complete ultrasound examination of the testicles, epididymis, and
other scrotal structures was performed. Color and spectral Doppler
ultrasound were also utilized to evaluate blood flow to the
testicles.

[Series 1: us scrotum w/doppler · 14 of 54 slices shown]
[im 1/54]
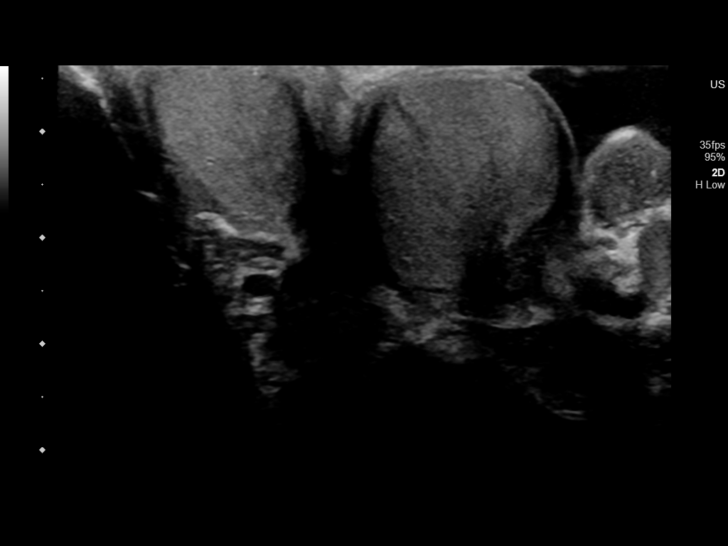
[im 5/54]
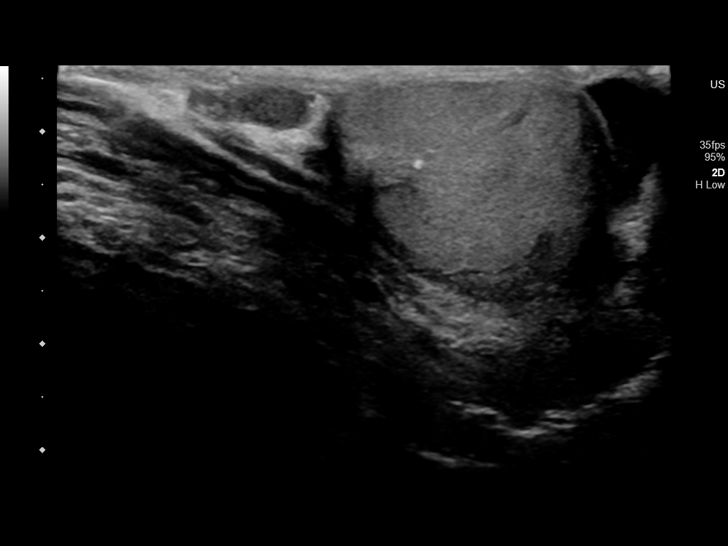
[im 9/54]
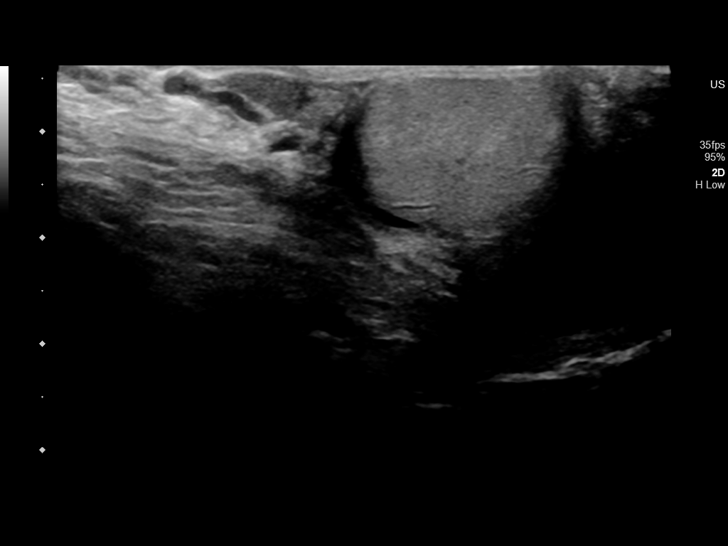
[im 14/54]
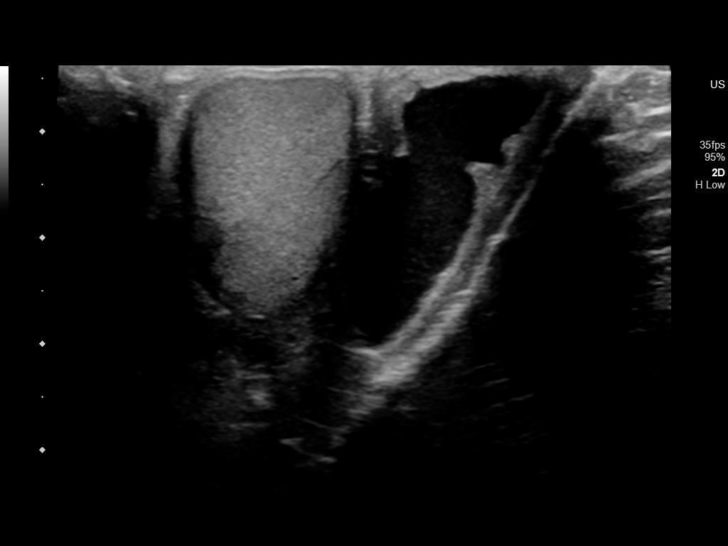
[im 18/54]
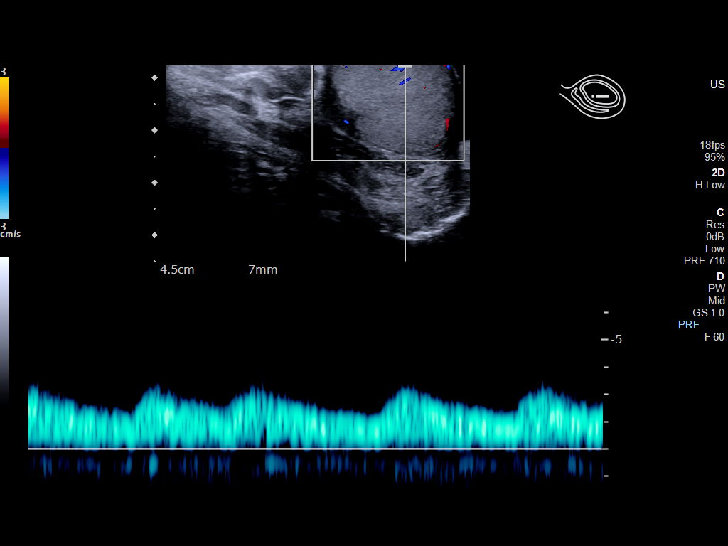
[im 20/54]
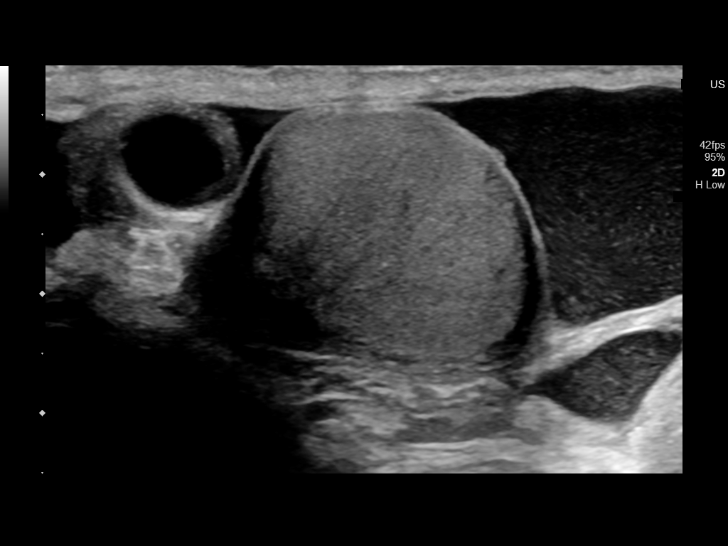
[im 25/54]
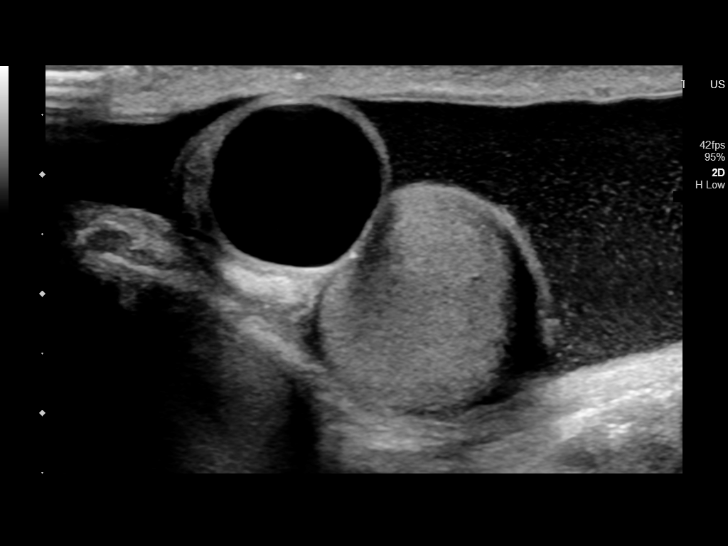
[im 29/54]
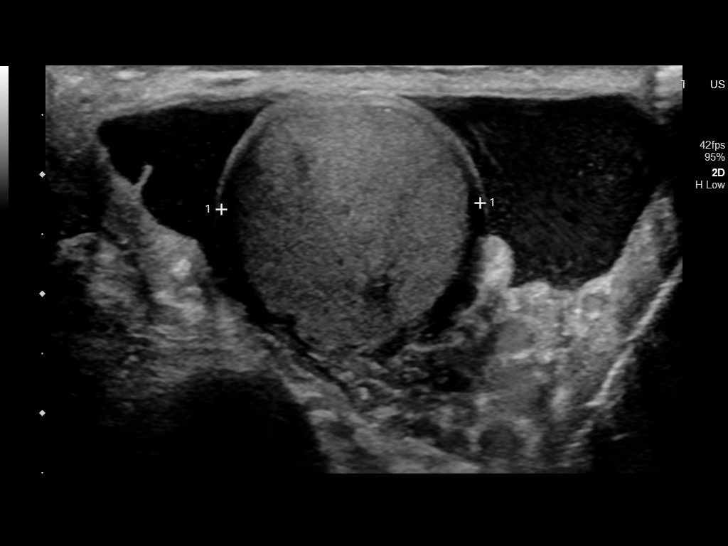
[im 34/54]
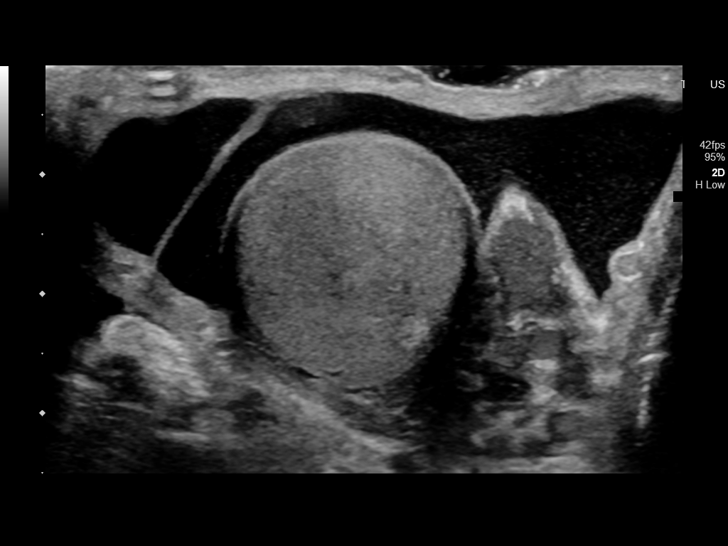
[im 36/54]
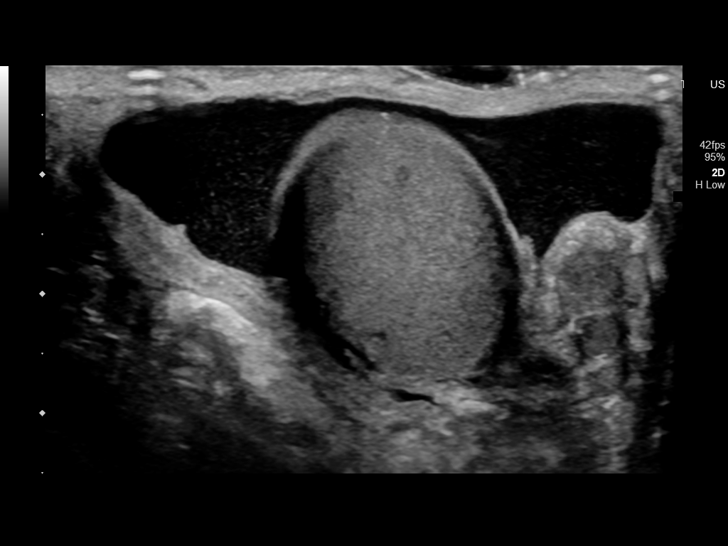
[im 40/54]
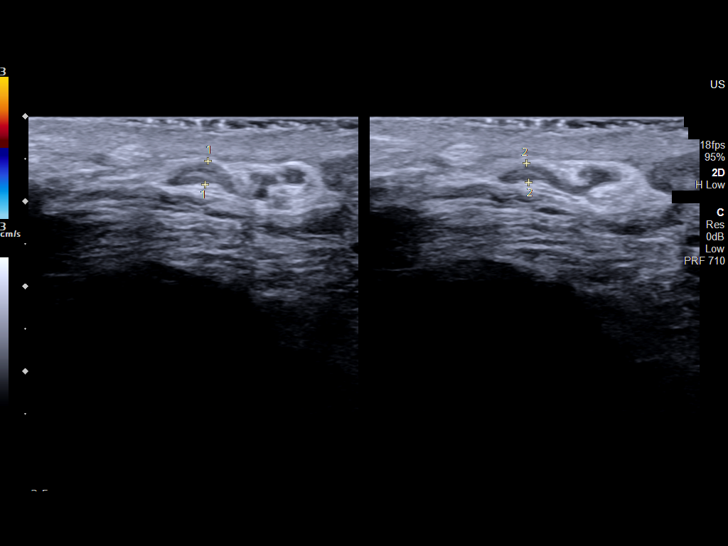
[im 45/54]
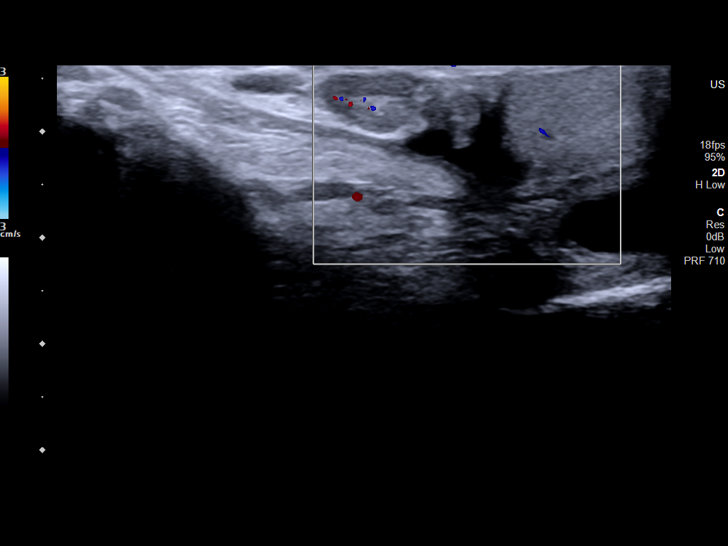
[im 49/54]
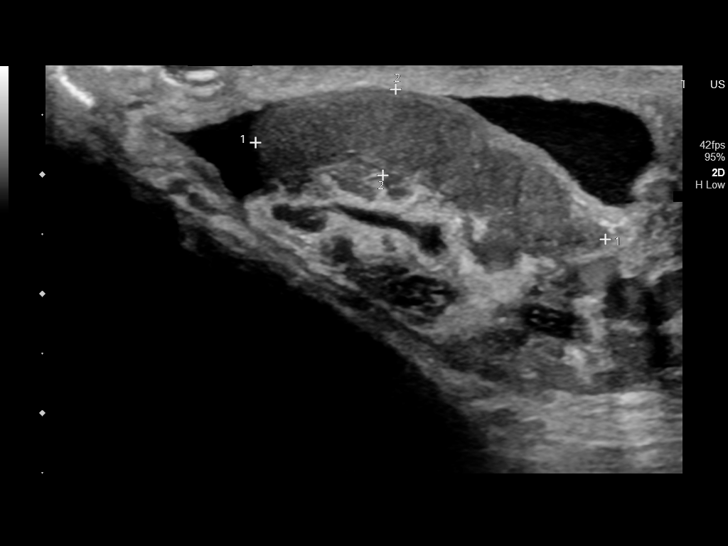
[im 54/54]
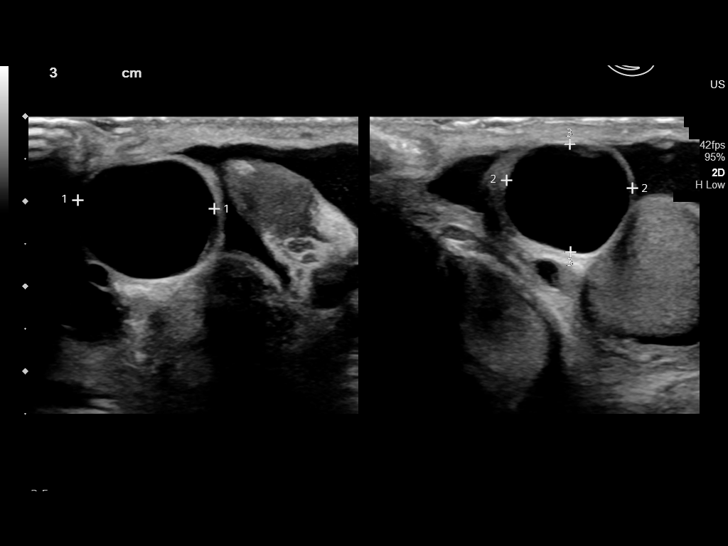

[14 of 25 positions shown; findings below may reference images not displayed]

FINDINGS: Right testicle

Measurements: 2.9 x 1.6 x 1.6 cm. No mass or microlithiasis
visualized.

Left testicle

Measurements: 2.6 x 2.0 x 2.2 cm. No mass or microlithiasis
visualized.

Right epididymis:  5 mm cyst is noted within the right epididymis.

Left epididymis:  1.6 cm left epididymal cyst is noted.

Hydrocele:  Left-sided complex hydrocele is noted.

Varicocele:  None visualized.

Pulsed Doppler interrogation of both testes demonstrates normal low
resistance arterial and venous waveforms bilaterally.
IMPRESSION: No evidence of testicular torsion.

Bilateral epididymal cysts.

Large left hydrocele which is complex in nature. Correlate with any
recent trauma.
